# Patient Record
Sex: Female | Born: 1950 | Race: White | Hispanic: No | Marital: Married | Smoking: Never smoker
Health system: Southern US, Community
[De-identification: ages and names within clinical notes are randomized; demographics above are authoritative.]

## PROBLEM LIST (undated history)

## (undated) DIAGNOSIS — T8859XA Other complications of anesthesia, initial encounter: Secondary | ICD-10-CM

## (undated) DIAGNOSIS — M79606 Pain in leg, unspecified: Secondary | ICD-10-CM

## (undated) DIAGNOSIS — Z9989 Dependence on other enabling machines and devices: Secondary | ICD-10-CM

## (undated) DIAGNOSIS — M5416 Radiculopathy, lumbar region: Secondary | ICD-10-CM

## (undated) DIAGNOSIS — F419 Anxiety disorder, unspecified: Secondary | ICD-10-CM

## (undated) DIAGNOSIS — M8580 Other specified disorders of bone density and structure, unspecified site: Secondary | ICD-10-CM

## (undated) DIAGNOSIS — M549 Dorsalgia, unspecified: Secondary | ICD-10-CM

## (undated) DIAGNOSIS — M5431 Sciatica, right side: Secondary | ICD-10-CM

## (undated) DIAGNOSIS — F32A Depression, unspecified: Secondary | ICD-10-CM

## (undated) DIAGNOSIS — M25559 Pain in unspecified hip: Secondary | ICD-10-CM

## (undated) DIAGNOSIS — R112 Nausea with vomiting, unspecified: Secondary | ICD-10-CM

## (undated) DIAGNOSIS — T4145XA Adverse effect of unspecified anesthetic, initial encounter: Secondary | ICD-10-CM

## (undated) DIAGNOSIS — M419 Scoliosis, unspecified: Secondary | ICD-10-CM

## (undated) DIAGNOSIS — E7800 Pure hypercholesterolemia, unspecified: Secondary | ICD-10-CM

## (undated) DIAGNOSIS — M5126 Other intervertebral disc displacement, lumbar region: Secondary | ICD-10-CM

## (undated) DIAGNOSIS — M1990 Unspecified osteoarthritis, unspecified site: Secondary | ICD-10-CM

## (undated) DIAGNOSIS — Z9889 Other specified postprocedural states: Secondary | ICD-10-CM

## (undated) DIAGNOSIS — G2581 Restless legs syndrome: Secondary | ICD-10-CM

## (undated) DIAGNOSIS — M4310 Spondylolisthesis, site unspecified: Secondary | ICD-10-CM

## (undated) DIAGNOSIS — B958 Unspecified staphylococcus as the cause of diseases classified elsewhere: Secondary | ICD-10-CM

## (undated) DIAGNOSIS — B009 Herpesviral infection, unspecified: Secondary | ICD-10-CM

## (undated) DIAGNOSIS — K219 Gastro-esophageal reflux disease without esophagitis: Secondary | ICD-10-CM

## (undated) DIAGNOSIS — M48061 Spinal stenosis, lumbar region without neurogenic claudication: Secondary | ICD-10-CM

## (undated) HISTORY — DX: Spondylolisthesis, site unspecified: M43.10

## (undated) HISTORY — DX: Other specified disorders of bone density and structure, unspecified site: M85.80

## (undated) HISTORY — DX: Gastro-esophageal reflux disease without esophagitis: K21.9

## (undated) HISTORY — DX: Restless legs syndrome: G25.81

## (undated) HISTORY — DX: Spinal stenosis, lumbar region without neurogenic claudication: M48.061

## (undated) HISTORY — DX: Pain in leg, unspecified: M79.606

## (undated) HISTORY — PX: COLONOSCOPY: SHX5424

## (undated) HISTORY — DX: Pain in unspecified hip: M25.559

## (undated) HISTORY — DX: Scoliosis, unspecified: M41.9

## (undated) HISTORY — PX: FINGER SURGERY: SHX640

## (undated) HISTORY — PX: DIAGNOSTIC LAPAROSCOPY: SUR761

## (undated) HISTORY — DX: Pure hypercholesterolemia, unspecified: E78.00

## (undated) HISTORY — DX: Other intervertebral disc displacement, lumbar region: M51.26

## (undated) HISTORY — DX: Unspecified staphylococcus as the cause of diseases classified elsewhere: B95.8

## (undated) HISTORY — DX: Sciatica, right side: M54.31

## (undated) HISTORY — DX: Dorsalgia, unspecified: M54.9

## (undated) HISTORY — DX: Radiculopathy, lumbar region: M54.16

## (undated) HISTORY — DX: Unspecified osteoarthritis, unspecified site: M19.90

---

## 2010-01-07 DIAGNOSIS — B958 Unspecified staphylococcus as the cause of diseases classified elsewhere: Secondary | ICD-10-CM

## 2010-01-07 HISTORY — DX: Unspecified staphylococcus as the cause of diseases classified elsewhere: B95.8

## 2017-02-07 DIAGNOSIS — M549 Dorsalgia, unspecified: Secondary | ICD-10-CM

## 2017-02-07 DIAGNOSIS — M25559 Pain in unspecified hip: Secondary | ICD-10-CM

## 2017-02-07 HISTORY — DX: Pain in unspecified hip: M25.559

## 2017-02-07 HISTORY — DX: Dorsalgia, unspecified: M54.9

## 2017-08-14 ENCOUNTER — Other Ambulatory Visit: Payer: Self-pay | Admitting: Neurosurgery

## 2017-08-14 DIAGNOSIS — M8580 Other specified disorders of bone density and structure, unspecified site: Secondary | ICD-10-CM

## 2017-10-01 ENCOUNTER — Ambulatory Visit
Admission: RE | Admit: 2017-10-01 | Discharge: 2017-10-01 | Disposition: A | Discharge status: Home / Self Care | Payer: Medicare Other | Source: Ambulatory Visit | Attending: Neurosurgery | Admitting: Neurosurgery

## 2017-10-01 DIAGNOSIS — M8580 Other specified disorders of bone density and structure, unspecified site: Secondary | ICD-10-CM

## 2017-10-01 HISTORY — DX: Other specified disorders of bone density and structure, unspecified site: M85.80

## 2017-11-06 ENCOUNTER — Telehealth: Payer: Self-pay | Admitting: Surgery

## 2017-11-06 ENCOUNTER — Other Ambulatory Visit: Payer: Self-pay | Admitting: *Deleted

## 2017-11-06 NOTE — Telephone Encounter (Signed)
sch appt spk to husband mld ltr 12/29/17 4pm f/u MD

## 2017-11-06 NOTE — Telephone Encounter (Signed)
-----   Message from Retta Mac, RN sent at 11/06/2017  1:43 PM EDT ----- Regarding: OFFICE APPOINTMENT Please schedule patient for office appointment with Dr. Myra Gianotti. ALIF surgery L5-S1. With Dr. Venetia Maxon is scheduled for 01/15/2018. Remind patient to bring any and all films related to this back surgery. Due to full schedules ... We can not reschedule if they miss this app Thanks, Kriste Basque

## 2017-11-07 ENCOUNTER — Encounter: Payer: Self-pay | Admitting: Surgery

## 2017-11-10 ENCOUNTER — Other Ambulatory Visit: Payer: Self-pay | Admitting: Neurosurgery

## 2017-11-12 ENCOUNTER — Other Ambulatory Visit: Payer: Self-pay | Admitting: *Deleted

## 2017-12-29 ENCOUNTER — Ambulatory Visit (INDEPENDENT_AMBULATORY_CARE_PROVIDER_SITE_OTHER): Payer: Medicare Other | Admitting: Surgery

## 2017-12-29 ENCOUNTER — Encounter: Payer: Self-pay | Admitting: Surgery

## 2017-12-29 VITALS — BP 129/70 | HR 78 | Temp 97.7°F | Resp 12 | Ht 65.0 in | Wt 146.0 lb

## 2017-12-29 DIAGNOSIS — M479 Spondylosis, unspecified: Secondary | ICD-10-CM | POA: Diagnosis not present

## 2017-12-29 NOTE — H&P (View-Only) (Signed)
Vascular and Vein Specialist of   Patient name: Jade MaskKathy Price MRN: [id number] DOB: 07/19/1950 Sex: female   REQUESTING PROVIDER:    Dr Venetia MaxonStern   REASON FOR CONSULT:    ALIF L5-S1  HISTORY OF PRESENT ILLNESS:   Jade Price is a 67 y.o. female, who is referred today for evaluation of L5-S1 anterior approach for degenerative back disease and scoliosis.  The patient has only had a diagnostic laparoscopic surgery performed in the remote past.  She is a non-smoker.  She suffers from hypercholesterolemia but does not take a statin.  She is on medication for reflux disease.  She has a history of postoperative nausea and vomiting.    PAST MEDICAL HISTORY    Past Medical History:  Diagnosis Date  . Arthritis   . Back pain 02/2017  . Degenerative lumbar spinal stenosis   . Disc displacement, lumbar   . GERD (gastroesophageal reflux disease)   . High cholesterol   . Hip pain 02/2017  . Leg pain    While walking  . Lumbar radiculopathy   . Osteopenia 10/01/2017  . RLS (restless legs syndrome)   . Sciatic notch pain, right   . Scoliosis   . Spondylolisthesis    Lumber region  . Staph infection 2012   Finger     FAMILY HISTORY   Family History  Problem Relation Age of Onset  . Cancer Mother   . Alzheimer's disease Mother     SOCIAL HISTORY:   Social History   Socioeconomic History  . Marital status: Married    Spouse name: Not on file  . Number of children: Not on file  . Years of education: Not on file  . Highest education level: Not on file  Occupational History  . Not on file  Social Needs  . Financial resource strain: Not on file  . Food insecurity:    Worry: Not on file    Inability: Not on file  . Transportation needs:    Medical: Not on file    Non-medical: Not on file  Tobacco Use  . Smoking status: Never Smoker  . Smokeless tobacco: Never Used  Substance and Sexual Activity  . Alcohol use: Not Currently    Comment: occasion  . Drug use: Never  . Sexual activity: Not on file  Lifestyle  . Physical activity:    Days per week: Not on file    Minutes per session: Not on file  . Stress: Not on file  Relationships  . Social connections:    Talks on phone: Not on file    Gets together: Not on file    Attends religious service: Not on file    Active member of club or organization: Not on file    Attends meetings of clubs or organizations: Not on file    Relationship status: Not on file  . Intimate partner violence:    Fear of current or ex partner: Not on file    Emotionally abused: Not on file    Physically abused: Not on file    Forced sexual activity: Not on file  Other Topics Concern  . Not on file  Social History Narrative  . Not on file    ALLERGIES:    No Known Allergies  CURRENT MEDICATIONS:    Current Outpatient Medications  Medication Sig Dispense Refill  . bismuth subsalicylate (PEPTO BISMOL) 262 MG chewable tablet Chew 524 mg by mouth as needed for indigestion or diarrhea or loose stools.    .Marland Kitchen  HYDROcodone-acetaminophen (NORCO/VICODIN) 5-325 MG tablet Take 1 tablet by mouth every 6 (six) hours as needed for moderate pain.    . naproxen sodium (ALEVE) 220 MG tablet Take 660 mg by mouth 3 (three) times daily as needed (for pain or headache).     Marland Kitchen. omeprazole (PRILOSEC OTC) 20 MG tablet Take 40 mg by mouth daily.    Marland Kitchen. rOPINIRole (REQUIP) 4 MG tablet Take 4 mg by mouth at bedtime.      No current facility-administered medications for this visit.     REVIEW OF SYSTEMS:   [X]  denotes positive finding, [ ]  denotes negative finding Cardiac  Comments:  Chest pain or chest pressure:    Shortness of breath upon exertion:    Short of breath when lying flat:    Irregular heart rhythm:        Vascular    Pain in calf, thigh, or hip brought on by ambulation:    Pain in feet at night that wakes you up from your sleep:     Blood clot in your veins:    Leg swelling:          Pulmonary    Oxygen at home:    Productive cough:     Wheezing:         Neurologic    Sudden weakness in arms or legs:     Sudden numbness in arms or legs:     Sudden onset of difficulty speaking or slurred speech:    Temporary loss of vision in one eye:     Problems with dizziness:         Gastrointestinal    Blood in stool:      Vomited blood:         Genitourinary    Burning when urinating:     Blood in urine:        Psychiatric    Major depression:         Hematologic    Bleeding problems:    Problems with blood clotting too easily:        Skin    Rashes or ulcers:        Constitutional    Fever or chills:     PHYSICAL EXAM:   Vitals:   12/29/17 1530  BP: 129/70  Pulse: 78  Resp: 12  Temp: 97.7 F (36.5 C)  SpO2: 96%  Weight: 146 lb (66.2 kg)  Height: 5\' 5"  (1.651 m)    GENERAL: The patient is a well-nourished female, in no acute distress. The vital signs are documented above. CARDIAC: There is a regular rate and rhythm.  VASCULAR: Palpable pedal pulses bilaterally PULMONARY: Nonlabored respirations ABDOMEN: Soft and non-tender with normal pitched bowel sounds.  MUSCULOSKELETAL: There are no major deformities or cyanosis. NEUROLOGIC: No focal weakness or paresthesias are detected. SKIN: There are no ulcers or rashes noted. PSYCHIATRIC: The patient has a normal affect.  STUDIES:   I have reviewed her MRIs from outside sources  ASSESSMENT and PLAN   Degenerative back disease: We discussed proceeding with anterior exposure for L5-S1 instrumentation by Dr. Venetia MaxonStern.  I discussed the risks and benefits of the operation including the risk of injury to the iliac artery and vein as well as the ureter.  All of her questions were answered.  Her procedure is been scheduled for January 9.   Durene CalWells Brabham, MD [facility] Tel [phone] Pager [phone]) [phone]

## 2017-12-29 NOTE — Progress Notes (Signed)
Vascular and Vein Specialist of   Patient name: Jade Price MRN: [id number] DOB: 07/19/1950 Sex: female   REQUESTING PROVIDER:    Dr Venetia MaxonStern   REASON FOR CONSULT:    ALIF L5-S1  HISTORY OF PRESENT ILLNESS:   Jade Price is a 67 y.o. female, who is referred today for evaluation of L5-S1 anterior approach for degenerative back disease and scoliosis.  The patient has only had a diagnostic laparoscopic surgery performed in the remote past.  She is a non-smoker.  She suffers from hypercholesterolemia but does not take a statin.  She is on medication for reflux disease.  She has a history of postoperative nausea and vomiting.    PAST MEDICAL HISTORY    Past Medical History:  Diagnosis Date  . Arthritis   . Back pain 02/2017  . Degenerative lumbar spinal stenosis   . Disc displacement, lumbar   . GERD (gastroesophageal reflux disease)   . High cholesterol   . Hip pain 02/2017  . Leg pain    While walking  . Lumbar radiculopathy   . Osteopenia 10/01/2017  . RLS (restless legs syndrome)   . Sciatic notch pain, right   . Scoliosis   . Spondylolisthesis    Lumber region  . Staph infection 2012   Finger     FAMILY HISTORY   Family History  Problem Relation Age of Onset  . Cancer Mother   . Alzheimer's disease Mother     SOCIAL HISTORY:   Social History   Socioeconomic History  . Marital status: Married    Spouse name: Not on file  . Number of children: Not on file  . Years of education: Not on file  . Highest education level: Not on file  Occupational History  . Not on file  Social Needs  . Financial resource strain: Not on file  . Food insecurity:    Worry: Not on file    Inability: Not on file  . Transportation needs:    Medical: Not on file    Non-medical: Not on file  Tobacco Use  . Smoking status: Never Smoker  . Smokeless tobacco: Never Used  Substance and Sexual Activity  . Alcohol use: Not Currently    Comment: occasion  . Drug use: Never  . Sexual activity: Not on file  Lifestyle  . Physical activity:    Days per week: Not on file    Minutes per session: Not on file  . Stress: Not on file  Relationships  . Social connections:    Talks on phone: Not on file    Gets together: Not on file    Attends religious service: Not on file    Active member of club or organization: Not on file    Attends meetings of clubs or organizations: Not on file    Relationship status: Not on file  . Intimate partner violence:    Fear of current or ex partner: Not on file    Emotionally abused: Not on file    Physically abused: Not on file    Forced sexual activity: Not on file  Other Topics Concern  . Not on file  Social History Narrative  . Not on file    ALLERGIES:    No Known Allergies  CURRENT MEDICATIONS:    Current Outpatient Medications  Medication Sig Dispense Refill  . bismuth subsalicylate (PEPTO BISMOL) 262 MG chewable tablet Chew 524 mg by mouth as needed for indigestion or diarrhea or loose stools.    .Marland Kitchen  HYDROcodone-acetaminophen (NORCO/VICODIN) 5-325 MG tablet Take 1 tablet by mouth every 6 (six) hours as needed for moderate pain.    . naproxen sodium (ALEVE) 220 MG tablet Take 660 mg by mouth 3 (three) times daily as needed (for pain or headache).     . omeprazole (PRILOSEC OTC) 20 MG tablet Take 40 mg by mouth daily.    . rOPINIRole (REQUIP) 4 MG tablet Take 4 mg by mouth at bedtime.      No current facility-administered medications for this visit.     REVIEW OF SYSTEMS:   [X] denotes positive finding, [ ] denotes negative finding Cardiac  Comments:  Chest pain or chest pressure:    Shortness of breath upon exertion:    Short of breath when lying flat:    Irregular heart rhythm:        Vascular    Pain in calf, thigh, or hip brought on by ambulation:    Pain in feet at night that wakes you up from your sleep:     Blood clot in your veins:    Leg swelling:          Pulmonary    Oxygen at home:    Productive cough:     Wheezing:         Neurologic    Sudden weakness in arms or legs:     Sudden numbness in arms or legs:     Sudden onset of difficulty speaking or slurred speech:    Temporary loss of vision in one eye:     Problems with dizziness:         Gastrointestinal    Blood in stool:      Vomited blood:         Genitourinary    Burning when urinating:     Blood in urine:        Psychiatric    Major depression:         Hematologic    Bleeding problems:    Problems with blood clotting too easily:        Skin    Rashes or ulcers:        Constitutional    Fever or chills:     PHYSICAL EXAM:   Vitals:   12/29/17 1530  BP: 129/70  Pulse: 78  Resp: 12  Temp: 97.7 F (36.5 C)  SpO2: 96%  Weight: 146 lb (66.2 kg)  Height: 5' 5" (1.651 m)    GENERAL: The patient is a well-nourished female, in no acute distress. The vital signs are documented above. CARDIAC: There is a regular rate and rhythm.  VASCULAR: Palpable pedal pulses bilaterally PULMONARY: Nonlabored respirations ABDOMEN: Soft and non-tender with normal pitched bowel sounds.  MUSCULOSKELETAL: There are no major deformities or cyanosis. NEUROLOGIC: No focal weakness or paresthesias are detected. SKIN: There are no ulcers or rashes noted. PSYCHIATRIC: The patient has a normal affect.  STUDIES:   I have reviewed her MRIs from outside sources  ASSESSMENT and PLAN   Degenerative back disease: We discussed proceeding with anterior exposure for L5-S1 instrumentation by Dr. Stern.  I discussed the risks and benefits of the operation including the risk of injury to the iliac artery and vein as well as the ureter.  All of her questions were answered.  Her procedure is been scheduled for January 9.   Wells Brabham, MD [facility] Ve[facility] Specialists of [place] Tel [phone] Pager [phone] 

## 2018-01-02 NOTE — Pre-Procedure Instructions (Signed)
Aurora MaskKathy Gaskins  01/02/2018       Your procedure is scheduled on Thursday, January 9.  [facility] at 5:30 AM               Your surgery or procedure is scheduled for 7:30 AM   Call this number if you have problems the morning of surgery: [phone]  This is the number for the Pre- Surgical Desk.                 For any other questions, please call [phone], Monday - Friday 8 AM - 4 PM.     Remember:  Do not eat or drink after midnight Wednesday, January 8.    Take these medicines the morning of surgery with A SIP OF WATER : omeprazole (PRILOSEC OTC)   May take HYDROcodone-acetaminophen (NORCO/VICODIN) if needed and you tolerate it on an empty stomach.   1 Week prior to surgery STOP taking Aspirin, Aspirin Products (Goody Powder, Excedrin Migraine), Ibuprofen (Advil), Naproxen (Aleve), Vitamins and Herbal Products (ie Fish Oil)  Special instructions:   [place]- Preparing For Surgery  Before surgery, you can play an important role. Because skin is not sterile, your skin needs to be as free of germs as possible. You can reduce the number of germs on your skin by washing with CHG (chlorahexidine gluconate) Soap before surgery.  CHG is an antiseptic cleaner which kills germs and bonds with the skin to continue killing germs even after washing.    Oral Hygiene is also important to reduce your risk of infection.  Remember - BRUSH YOUR TEETH THE MORNING OF SURGERY WITH YOUR REGULAR TOOTHPASTE  Please do not use if you have an allergy to CHG or antibacterial soaps. If your skin becomes reddened/irritated stop using the CHG.  Do not shave (including legs and underarms) for at least 48 hours prior to first CHG shower. It is OK to shave your face.  Please follow these instructions carefully.   1. Shower the NIGHT BEFORE SURGERY and the MORNING OF SURGERY with CHG.   2. If you chose to wash your hair, wash your hair first as usual with your normal  shampoo.  3. After you shampoo, rinse your hair and body thoroughly to remove the shampoo.  4. Use CHG as you would any other liquid soap. You can apply CHG directly to the skin and wash gently with a scrungie or a clean washcloth.   5. Apply the CHG Soap to your body ONLY FROM THE NECK DOWN.  Do not use on open wounds or open sores. Avoid contact with your eyes, ears, mouth and genitals (private parts). Wash Face and genitals (private parts)  with your normal soap.  6. Wash thoroughly, paying special attention to the area where your surgery will be performed.  7. Thoroughly rinse your body with warm water from the neck down.  8. DO NOT shower/wash with your normal soap after using and rinsing off the CHG Soap.  9. Pat yourself dry with a CLEAN TOWEL.  10. Wear CLEAN PAJAMAS to bed the night before surgery, wear comfortable clothes the morning of surgery  11. Place CLEAN SHEETS on your bed the night of your first shower and DO NOT SLEEP WITH PETS.  Day of Surgery: Shower as instructed above Do not apply any deodorants/lotions, powders or colognes.  Please wear clean clothes to the hospital/surgery center.   Remember to brush your teeth WITH YOUR REGULAR  TOOTHPASTE.             Do not wear jewelry, make-up or nail polish.  Do not shave 48 hours prior to surgery.    Do not bring valuables to the hospital.  [facility] is not [facility] for any belongings or valuables.  Contacts, dentures or bridgework may not be worn into surgery.  Leave your suitcase in the car.  After surgery it may be brought to your room.  For patients admitted to the hospital, discharge time will be determined by your treatment team.  Patients discharged the day of surgery will not be allowed to drive home.   Please read over the following fact sheets that you were given: Pain Booklet, Patient Instructions for Mupirocin Application, Coughing and Deep Breathing, Surgical Site Infections.

## 2018-01-05 ENCOUNTER — Encounter (HOSPITAL_COMMUNITY)
Admission: RE | Admit: 2018-01-05 | Discharge: 2018-01-05 | Disposition: A | Discharge status: Home / Self Care | Payer: Medicare Other | Source: Ambulatory Visit | Attending: Neurosurgery | Admitting: Neurosurgery

## 2018-01-05 ENCOUNTER — Other Ambulatory Visit: Payer: Self-pay

## 2018-01-05 ENCOUNTER — Encounter (HOSPITAL_COMMUNITY): Payer: Self-pay

## 2018-01-05 DIAGNOSIS — Z01812 Encounter for preprocedural laboratory examination: Secondary | ICD-10-CM | POA: Insufficient documentation

## 2018-01-05 HISTORY — DX: Other complications of anesthesia, initial encounter: T88.59XA

## 2018-01-05 HISTORY — DX: Other specified postprocedural states: R11.2

## 2018-01-05 HISTORY — DX: Anxiety disorder, unspecified: F41.9

## 2018-01-05 HISTORY — DX: Adverse effect of unspecified anesthetic, initial encounter: T41.45XA

## 2018-01-05 HISTORY — DX: Other specified postprocedural states: Z98.890

## 2018-01-05 LAB — CBC
HCT: 38.8 % (ref 36.0–46.0)
Hemoglobin: 12.4 g/dL (ref 12.0–15.0)
MCH: 29.4 pg (ref 26.0–34.0)
MCHC: 32 g/dL (ref 30.0–36.0)
MCV: 91.9 fL (ref 80.0–100.0)
Platelets: 268 10*3/uL (ref 150–400)
RBC: 4.22 MIL/uL (ref 3.87–5.11)
RDW: 13.5 % (ref 11.5–15.5)
WBC: 6.1 10*3/uL (ref 4.0–10.5)
nRBC: 0 % (ref 0.0–0.2)

## 2018-01-05 LAB — BASIC METABOLIC PANEL
Anion gap: 11 (ref 5–15)
BUN: 13 mg/dL (ref 8–23)
CO2: 21 mmol/L — ABNORMAL LOW (ref 22–32)
Calcium: 8.8 mg/dL — ABNORMAL LOW (ref 8.9–10.3)
Chloride: 108 mmol/L (ref 98–111)
Creatinine, Ser: 0.64 mg/dL (ref 0.44–1.00)
GFR calc Af Amer: >60 mL/min (ref >60)
GFR calc non Af Amer: >60 mL/min (ref >60)
Glucose, Bld: 139 mg/dL — ABNORMAL HIGH (ref 70–99)
Potassium: 4.1 mmol/L (ref 3.5–5.1)
Sodium: 140 mmol/L (ref 135–145)

## 2018-01-05 LAB — TYPE AND SCREEN
ABO/RH(D): O POS
Antibody Screen: NEGATIVE

## 2018-01-05 LAB — SURGICAL PCR SCREEN
MRSA, PCR: NEGATIVE
Staphylococcus aureus: NEGATIVE

## 2018-01-05 LAB — ABO/RH: ABO/RH(D): O POS

## 2018-01-05 NOTE — Progress Notes (Signed)
Jade Price reports that she cracked a tooth and has a dental appoinment 01/08/18, Patient questioned if it would interfere with surgery.  I spoke with Antionette PolesJames Burns, PA-C and he said it shouldn't, but to notify surgeon.  I called and left a message on [name]'s voice mail at Dr Fredrich BirksStern's office.

## 2018-01-05 NOTE — Progress Notes (Addendum)
PCP - Dr. Jiles Prowsay McGee,   Cardiologist - no  Chest x-ray -   EKG - NA  Stress Test - no  ECHO - no  Cardiac Cath - no  AICD-no PM- LOOP-no  Sleep Study - no CPAP - noCBC BMP T/S  LABS-CBC BMP T/S  ASA-  HA1C-NA Fasting Blood Sugar -  Checks Blood Sugar _____ times a day  Anesthesia-  Pt denies having chest pain, sob, or fever at this time. All instructions explained to the pt, with a verbal understanding of the material. Pt agrees to go over the instructions while at home for a better understanding. The opportunity to ask questions was provided.

## 2018-01-13 NOTE — H&P (Signed)
Patient ID:   [id number] Patient: Jade MaskKathy Price  Date of Birth: 09-10-1950 Visit Type: Office Visit   Date: 10/13/2017 12:45 PM Provider: Danae OrleansJoseph D. Venetia MaxonStern MD   This 68 year old female presents for back pain.  HISTORY OF PRESENT ILLNESS: 1.  back pain  10/01/2017 bone density revealed osteopenia  09/18/2017 right L3-4, right L4-5 TF ESI's by Dr. Venetia MaxonStern  Patient returns as scheduled noting "pain is returning".  She reports "great relief" x2 weeks following the injection.  Currently she notes right buttock pain.  She reports difficulty sleeping due to comfort.  Norco 5/325 taken q.i.d.  Despite frequent pain medication the patient is still in considerable discomfort and is very frustrated by her lack of improvement.  She is currently grading her back pain as 7/10 in severity.  We have discussed in detail surgical treatment options and given the severity of her spinal stenosis and scoliosis with foraminal stenosis and lateral listhesis with significant right leg pain greater than left leg pain, I have recommended proceeding with surgery.  This will consist of two-stage decompression and fusion with L5-S1 ALIF followed by right L1-2, L2-3, L3-4, L4-5 anterolateral decompression and fusion as stage I followed by T10 to pelvis fixation with intraoperative navigation as stage II.  I discussed the details of this surgery with the patient and her husband and they wished to proceed.      Medical/Surgical/Interim History Reviewed, no change.  Last detailed document date:08/04/2017.     PAST MEDICAL HISTORY, SURGICAL HISTORY, FAMILY HISTORY, SOCIAL HISTORY AND REVIEW OF SYSTEMS I have reviewed the patient's past medical, surgical, family and social history as well as the comprehensive review of systems as included on the [facility] history form dated 08/11/2017, which I have signed.  Family History: Reviewed, no changes.  Last detailed document date:08/04/2017.    Social History: Reviewed, no changes. Last detailed document date: 08/04/2017.    MEDICATIONS: (added, continued or stopped this visit) Started Medication Directions Instruction Stopped  Aleve 220 mg capsule    10/13/2017 Neurontin 300 mg capsule take 1 capsule by oral route 3 times every day   10/08/2017 Norco 5 mg-325 mg tablet take 1 tablet by oral route  every 6 hours as needed for pain   10/13/2017 Norco 5 mg-325 mg tablet take 1 tablet by oral route  every 6 hours as needed for pain    omeprazole 20 mg delayed release,disintegrating tablet     ropinirole 1 mg tablet take 1 tablet by oral route 2 times every day      ALLERGIES: Ingredient Reaction Medication Name Comment NO KNOWN ALLERGIES    No known allergies.    PHYSICAL EXAM:  Vitals Date Temp F BP Pulse Ht In Wt Lb BMI BSA Pain Score 10/13/2017  127/69 73 66 146 23.56  7/10     IMPRESSION:  Severe protracted scoliosis and lumbar radiculopathy under control with medications and without sustained relief from injections  PLAN: Proceed with decompression and fusion surgery.  Patient was fitted for a TLSO brace.  Risks and benefits were discussed with the patient in detail and nurse education was performed.  Orders: Diagnostic Procedures: Assessment Procedure M54.16 Scoliosis- AP/Lat Instruction(s)/Education: Assessment Instruction R03.0 Hypertension education Miscellaneous: Assessment  M41.20 TLSO Brace (Drawstring)  Completed Orders (this encounter) Order Details Reason Side Interpretation Result Initial Treatment Date Region Hypertension education Continue to monitor blood pressure, if blood pressure remains elevated contact primary doctor        Assessment/Plan  #  Detail Type Description  1. Assessment Low back pain, unspecified back pain laterality, with sciatica presence unspecified (M54.5).      2. Assessment Degenerative lumbar spinal stenosis (M48.061).     3. Assessment Lumbar radiculopathy (M54.16).     4. Assessment Spondylolisthesis, lumbar region (M43.16).     5. Assessment Scoliosis (and kyphoscoliosis), idiopathic (M41.20).  Plan Orders TLSO Brace (Drawstring).     6. Assessment Elevated blood-pressure reading, w/o diagnosis of htn (R03.0).       Pain Management Plan Pain Scale: 7/10. Method: Numeric Pain Intensity Scale. Location: back. Onset: 03/06/2017. Duration: varies. Quality: discomforting. Pain management follow-up plan of care: Patient taken medication as prescribed.  Fall Risk Plan The patient has not fallen in the last year.     MEDICATIONS PRESCRIBED TODAY    Rx Quantity Refills NORCO 5 mg-325 mg  120 0 NEURONTIN 300 mg  90 3           Provider:  Danae Orleans. Venetia Maxon MD  10/18/2017 04:14 PM Dictation edited by: Danae Orleans. Venetia Maxon    CC Providers: PCP  None   Jonathon Bellows  [address] [place], [place] [address]               Electronically signed by Danae Orleans Venetia Maxon MD on 10/18/2017 04:14 PM     Patient ID:   [id number] Patient: Jade Price  Date of Birth: 07/19/1950 Visit Type: Office Visit   Date: 08/04/2017 01:00 PM Provider: Danae Orleans. Venetia Maxon MD   This 68 year old female presents for back pain.  HISTORY OF PRESENT ILLNESS: 1.  back pain  Jade Price, 68 year old female and wife of retired family physician Dr. Casimiro Needle, visits for evaluation.  Patient notes longstanding history scoliosis with mild back pain.  Mid February, patient experienced severe lumbar pain preventing ambulation with shooting pains down the right leg.  She recalls no injury.    Steroid Dosepak offered no lasting relief Norco 5/325 b.i.d. Ran out  History:  RLS, GERD, scoliosis, cholesterol Surgical history:  Right 5th digit staph infection, laparoscopy years ago  Imaging on Canopy  I  reviewed the prior physicians recommendations for surgical treatment which consisted of L1 through L5 decompression and fixation at L4 through L5 level.  I reviewed the patient's radiographs which show levoconvex scoliosis of the lumbar spine there is also sagittal imbalance and the patient has retroverted pelvis.  She has a significant compensatory thoracic curvature and primary lumbar scoliosis curvature.  She has spondylolisthesis of L4 on L5 of 7 mm on neutral, 6 mm on flexion, 6 mm on extension and significant right-sided nerve root compression with lateral listhesis at the L3-4 and L4-5 levels affecting the right L3 and right L4 nerve roots.       PAST MEDICAL/SURGICAL HISTORY:   (Detailed)      PAST MEDICAL HISTORY, SURGICAL HISTORY, FAMILY HISTORY, SOCIAL HISTORY AND REVIEW OF SYSTEMS I have reviewed the patient's past medical, surgical, family and social history as well as the comprehensive review of systems as included on the [facility] history form dated 08/04/2017, which I have signed.  Family History:  (Detailed)   Social History:  (Detailed) Tobacco use reviewed. Preferred language is [place].   Tobacco use status: Current non-smoker. Smoking status: Never smoker.  SMOKING STATUS Type Smoking Status Usage Per Day Years Used Total Pack Years  Never smoker         MEDICATIONS: (added, continued or stopped this  visit) Started Medication Directions Instruction Stopped  Aleve 220 mg capsule    08/04/2017 Norco 5 mg-325 mg tablet take 1 tablet by oral route  every 6 hours as needed for pain    omeprazole 20 mg delayed release,disintegrating tablet     ropinirole 1 mg tablet take 1 tablet by oral route 2 times every day      ALLERGIES: Ingredient Reaction Medication Name Comment NO KNOWN ALLERGIES    No known allergies.   REVIEW OF SYSTEMS  See scanned patient registration form, dated  08/04/2017, signed and dated on 08/09/2017  Review of Systems Details System Neg/Pos Details Constitutional Negative Chills, Fatigue, Fever, Malaise, Night sweats, Weight gain and Weight loss. ENMT Negative Ear drainage, Hearing loss, Nasal drainage, Otalgia, Sinus pressure and Sore throat. Eyes Negative Eye discharge, Eye pain and Vision changes. Respiratory Negative Chronic cough, Cough, Dyspnea, Known TB exposure and Wheezing. Cardio Negative Chest pain, Claudication, Edema and Irregular heartbeat/palpitations. GI Negative Abdominal pain, Blood in stool, Change in stool pattern, Constipation, Decreased appetite, Diarrhea, Heartburn, Nausea and Vomiting. GU Negative Dysuria, Hematuria, Polyuria (Genitourinary), Urinary frequency, Urinary incontinence and Urinary retention. Endocrine Negative Cold intolerance, Heat intolerance, Polydipsia and Polyphagia. Neuro Positive Gait disturbance. Psych Negative Anxiety, Depression and Insomnia. Integumentary Negative Brittle hair, Brittle nails, Change in shape/size of mole(s), Hair loss, Hirsutism, Hives, Pruritus, Rash and Skin lesion. MS Positive Back pain, RLE pain. Hema/Lymph Negative Easy bleeding, Easy bruising and Lymphadenopathy. Allergic/Immuno Negative Contact allergy, Environmental allergies, Food allergies and Seasonal allergies. Reproductive Negative Breast discharge, Breast lumps, Dysmenorrhea, Dyspareunia, History of abnormal PAP smear, Hot flashes, Irregular menses and Vaginal discharge.  PHYSICAL EXAM:  Vitals Date Temp F BP Pulse Ht In Wt Lb BMI BSA Pain Score 08/04/2017  121/74 85 66 154 24.86  7/10   PHYSICAL EXAM Details General Level of Distress: no acute distress Overall Appearance: normal  Head and Face  Right Left  Fundoscopic Exam:  normal normal    Cardiovascular Cardiac: regular rate and rhythm without murmur  Right Left  Carotid  Pulses: normal normal  Respiratory Lungs: clear to auscultation  Neurological Orientation: normal Recent and Remote Memory: normal Attention Span and Concentration:   normal Language: normal Fund of Knowledge: normal  Right Left Sensation: normal normal Upper Extremity Coordination: normal normal  Lower Extremity Coordination: normal normal  Musculoskeletal Gait and Station: normal  Right Left Upper Extremity Muscle Strength: normal normal Lower Extremity Muscle Strength: normal normal Upper Extremity Muscle Tone:  normal normal Lower Extremity Muscle Tone: normal normal   Motor Strength Upper and lower extremity motor strength was tested in the clinically pertinent muscles.     Deep Tendon Reflexes  Right Left Biceps: normal normal Triceps: normal normal Brachioradialis: normal normal Patellar: normal normal Achilles: normal normal  Sensory Sensation was tested at L1 to S1.   Cranial Nerves II. Optic Nerve/Visual Fields: normal III. Oculomotor: normal IV. Trochlear: normal V. Trigeminal: normal VI. Abducens: normal VII. Facial: normal VIII. Acoustic/Vestibular: normal IX. Glossopharyngeal: normal X. Vagus: normal XI. Spinal Accessory: normal XII. Hypoglossal: normal  Motor and other Tests Lhermittes: negative Rhomberg: negative Pronator drift: absent     Right Left Hoffman's: normal normal Clonus: normal normal Babinski: normal normal SLR: negative negative Patrick's Pearlean Brownie): negative negative Toe Walk: normal normal Toe Lift: normal normal Heel Walk: normal normal SI Joint: nontender nontender   Additional Findings:  Patient is right sciatic notch discomfort to palpation.  She is able to bend touch her toes.  She is  able stand on her heels and toes.    IMPRESSION:  The patient is in considerable pain and has significant scoliotic curvature.  I recommended against the surgical recommendations made to her at her previous evaluation and  explained that if she was going to require surgery I did not think that a decompression over 5 levels with a fusion with hardware at 1 level would give her any long-term benefit.  I think it quite likely that she will require surgical intervention.  I would like to obtain scoliosis radiographs and bone density testing.  The patient will undergo a trial of injections and I introduced her Dr. Ollen BowlHarkins and we have gone ahead to set up right L3 and right L4 selective nerve root blocks to see if this will give her any relief of her discomfort.  The patient will follow-up with me after these injections to see how she is doing.  I refilled her prescription for hydrocodone today.  PLAN: Patient will follow-up with me with bone density testing and scoliosis radiographs after injections to see how she does in terms of managing her pain.  I will make further recommendations after that.  Orders: Office Procedures/Services: Assessment Service Comments M54.16 Lumbar Spine - NRB - right - L3 - L4   Diagnostic Procedures: Assessment Procedure M41.20 Scoliosis- AP/Lat M54.16 Lumbar Spine- AP/Lat/Flex/Ex  Completed Orders (this encounter) Order Details Reason Side Interpretation Result Initial Treatment Date Region Scoliosis- AP/Lat 1 of 2     08/04/2017 All Levels to All Levels Lumbar Spine- AP/Lat/Flex/Ex 2 of 2     08/04/2017 All Levels to All Levels  Assessment/Plan  # Detail Type Description  1. Assessment Disc displacement, lumbar (M51.26).     2. Assessment Degenerative lumbar spinal stenosis (M48.061).     3. Assessment Low back pain, unspecified back pain laterality, with sciatica presence unspecified (M54.5).     4. Assessment Spondylolisthesis, lumbar region (M43.16).     5. Assessment Scoliosis (and kyphoscoliosis), idiopathic (M41.20).     6. Assessment Lumbar radiculopathy (M54.16).  Plan Orders Lumbar Spine - NRB - right - L3 -  L4.       Pain Management Plan Location: back. Onset: 03/06/2017. Duration: varies. Quality: discomforting. Pain management follow-up plan of care: Patient taking medication as prescribed.Marland Kitchen.     MEDICATIONS PRESCRIBED TODAY    Rx Quantity Refills NORCO 5 mg-325 mg  60 0           Provider:  Danae OrleansJoseph D. Venetia MaxonStern MD  08/10/2017 12:34 PM Dictation edited by: Danae OrleansJoseph D. [facility]    CC Providers: Devin GoingAmanda  Mcgee [address] [place]  [phone]   Jonathon Bellowsachel McGee  [address] [place], [place] [phone]               Electronically signed by Danae OrleansJoseph D. Venetia MaxonStern MD on 08/10/2017 12:34 PM

## 2018-01-15 ENCOUNTER — Inpatient Hospital Stay (HOSPITAL_COMMUNITY): Payer: Medicare Other

## 2018-01-15 ENCOUNTER — Other Ambulatory Visit: Payer: Self-pay

## 2018-01-15 ENCOUNTER — Encounter (HOSPITAL_COMMUNITY)
Admission: RE | Disposition: A | Discharge status: Rehabilitation Facility | Payer: Self-pay | Source: Home / Self Care | Attending: Neurosurgery

## 2018-01-15 ENCOUNTER — Inpatient Hospital Stay (HOSPITAL_COMMUNITY)
Admission: RE | Admit: 2018-01-15 | Discharge: 2018-01-23 | DRG: 454 | Disposition: A | Discharge status: Rehabilitation Facility | Payer: Medicare Other | Attending: Neurosurgery | Admitting: Neurosurgery

## 2018-01-15 ENCOUNTER — Inpatient Hospital Stay (HOSPITAL_COMMUNITY): Payer: Medicare Other | Admitting: Certified Registered Nurse Anesthetist

## 2018-01-15 ENCOUNTER — Encounter (HOSPITAL_COMMUNITY): Payer: Self-pay

## 2018-01-15 DIAGNOSIS — Z981 Arthrodesis status: Secondary | ICD-10-CM | POA: Diagnosis not present

## 2018-01-15 DIAGNOSIS — M5416 Radiculopathy, lumbar region: Secondary | ICD-10-CM | POA: Diagnosis present

## 2018-01-15 DIAGNOSIS — G2581 Restless legs syndrome: Secondary | ICD-10-CM | POA: Diagnosis present

## 2018-01-15 DIAGNOSIS — M4804 Spinal stenosis, thoracic region: Secondary | ICD-10-CM | POA: Diagnosis present

## 2018-01-15 DIAGNOSIS — M1990 Unspecified osteoarthritis, unspecified site: Secondary | ICD-10-CM | POA: Diagnosis present

## 2018-01-15 DIAGNOSIS — K219 Gastro-esophageal reflux disease without esophagitis: Secondary | ICD-10-CM | POA: Diagnosis present

## 2018-01-15 DIAGNOSIS — E785 Hyperlipidemia, unspecified: Secondary | ICD-10-CM | POA: Diagnosis present

## 2018-01-15 DIAGNOSIS — Z0289 Encounter for other administrative examinations: Secondary | ICD-10-CM

## 2018-01-15 DIAGNOSIS — D62 Acute posthemorrhagic anemia: Secondary | ICD-10-CM | POA: Diagnosis not present

## 2018-01-15 DIAGNOSIS — Z79899 Other long term (current) drug therapy: Secondary | ICD-10-CM

## 2018-01-15 DIAGNOSIS — Z888 Allergy status to other drugs, medicaments and biological substances status: Secondary | ICD-10-CM | POA: Diagnosis not present

## 2018-01-15 DIAGNOSIS — K5900 Constipation, unspecified: Secondary | ICD-10-CM | POA: Diagnosis present

## 2018-01-15 DIAGNOSIS — M4316 Spondylolisthesis, lumbar region: Secondary | ICD-10-CM | POA: Diagnosis present

## 2018-01-15 DIAGNOSIS — Z820 Family history of epilepsy and other diseases of the nervous system: Secondary | ICD-10-CM | POA: Diagnosis not present

## 2018-01-15 DIAGNOSIS — M961 Postlaminectomy syndrome, not elsewhere classified: Secondary | ICD-10-CM | POA: Diagnosis not present

## 2018-01-15 DIAGNOSIS — M8580 Other specified disorders of bone density and structure, unspecified site: Secondary | ICD-10-CM | POA: Diagnosis present

## 2018-01-15 DIAGNOSIS — M48062 Spinal stenosis, lumbar region with neurogenic claudication: Secondary | ICD-10-CM | POA: Diagnosis not present

## 2018-01-15 DIAGNOSIS — G8929 Other chronic pain: Secondary | ICD-10-CM | POA: Diagnosis present

## 2018-01-15 DIAGNOSIS — M48061 Spinal stenosis, lumbar region without neurogenic claudication: Secondary | ICD-10-CM | POA: Diagnosis present

## 2018-01-15 DIAGNOSIS — M545 Low back pain: Secondary | ICD-10-CM | POA: Diagnosis present

## 2018-01-15 DIAGNOSIS — Z9889 Other specified postprocedural states: Secondary | ICD-10-CM

## 2018-01-15 DIAGNOSIS — K5901 Slow transit constipation: Secondary | ICD-10-CM | POA: Diagnosis not present

## 2018-01-15 DIAGNOSIS — M4125 Other idiopathic scoliosis, thoracolumbar region: Secondary | ICD-10-CM | POA: Diagnosis present

## 2018-01-15 DIAGNOSIS — M5415 Radiculopathy, thoracolumbar region: Secondary | ICD-10-CM | POA: Diagnosis not present

## 2018-01-15 DIAGNOSIS — Z419 Encounter for procedure for purposes other than remedying health state, unspecified: Secondary | ICD-10-CM

## 2018-01-15 DIAGNOSIS — E876 Hypokalemia: Secondary | ICD-10-CM | POA: Diagnosis not present

## 2018-01-15 DIAGNOSIS — M419 Scoliosis, unspecified: Secondary | ICD-10-CM

## 2018-01-15 DIAGNOSIS — Z4789 Encounter for other orthopedic aftercare: Secondary | ICD-10-CM | POA: Diagnosis present

## 2018-01-15 DIAGNOSIS — M5116 Intervertebral disc disorders with radiculopathy, lumbar region: Secondary | ICD-10-CM | POA: Diagnosis present

## 2018-01-15 HISTORY — PX: ANTERIOR LATERAL LUMBAR FUSION 4 LEVELS: SHX5552

## 2018-01-15 HISTORY — PX: ANTERIOR LUMBAR FUSION: SHX1170

## 2018-01-15 HISTORY — PX: ABDOMINAL EXPOSURE: SHX5708

## 2018-01-15 SURGERY — ANTERIOR LUMBAR FUSION 1 LEVEL
Anesthesia: General | Laterality: Right

## 2018-01-15 MED ORDER — FENTANYL CITRATE (PF) 100 MCG/2ML IJ SOLN
INTRAMUSCULAR | Status: DC | PRN
  Administered 2018-01-15: 100 ug via INTRAVENOUS
  Administered 2018-01-15: 50 ug via INTRAVENOUS
  Administered 2018-01-15 (×3): 100 ug via INTRAVENOUS
  Administered 2018-01-15 (×2): 50 ug via INTRAVENOUS
  Administered 2018-01-15: 150 ug via INTRAVENOUS
  Administered 2018-01-15: 50 ug via INTRAVENOUS

## 2018-01-15 MED ORDER — ALUM & MAG HYDROXIDE-SIMETH 200-200-20 MG/5ML PO SUSP: 30.0000 mL | Freq: Four times a day (QID) | ORAL | Status: DC | PRN

## 2018-01-15 MED ORDER — ACETAMINOPHEN 325 MG PO TABS
650.0000 mg | ORAL_TABLET | ORAL | Status: DC | PRN
  Administered 2018-01-16 – 2018-01-22 (×10): 650 mg via ORAL
  Filled 2018-01-15 (×9): qty 2

## 2018-01-15 MED ORDER — LACTATED RINGERS IV SOLN
INTRAVENOUS | Status: DC | PRN
  Administered 2018-01-15 (×2): via INTRAVENOUS

## 2018-01-15 MED ORDER — FENTANYL CITRATE (PF) 100 MCG/2ML IJ SOLN
INTRAMUSCULAR | Status: AC
  Administered 2018-01-15: 50 ug via INTRAVENOUS
  Filled 2018-01-15: qty 2

## 2018-01-15 MED ORDER — DEXAMETHASONE SODIUM PHOSPHATE 10 MG/ML IJ SOLN
INTRAMUSCULAR | Status: AC
  Filled 2018-01-15: qty 1

## 2018-01-15 MED ORDER — LIDOCAINE 2% (20 MG/ML) 5 ML SYRINGE
INTRAMUSCULAR | Status: AC
  Filled 2018-01-15: qty 5

## 2018-01-15 MED ORDER — HYDROCODONE-ACETAMINOPHEN 5-325 MG PO TABS
1.0000 | ORAL_TABLET | Freq: Four times a day (QID) | ORAL | Status: DC | PRN
  Administered 2018-01-15: 1 via ORAL
  Filled 2018-01-15: qty 1

## 2018-01-15 MED ORDER — ONDANSETRON HCL 4 MG PO TABS: 4.0000 mg | ORAL_TABLET | Freq: Four times a day (QID) | ORAL | Status: DC | PRN

## 2018-01-15 MED ORDER — ONDANSETRON HCL 4 MG/2ML IJ SOLN
4.0000 mg | Freq: Once | INTRAMUSCULAR | Status: AC | PRN
  Administered 2018-01-15: 4 mg via INTRAVENOUS

## 2018-01-15 MED ORDER — THROMBIN 5000 UNITS EX SOLR
OROMUCOSAL | Status: DC | PRN
  Administered 2018-01-15: 09:00:00 via TOPICAL

## 2018-01-15 MED ORDER — OMEPRAZOLE MAGNESIUM 20 MG PO TBEC: 40.0000 mg | DELAYED_RELEASE_TABLET | Freq: Every day | ORAL | Status: DC

## 2018-01-15 MED ORDER — MIDAZOLAM HCL 2 MG/2ML IJ SOLN
INTRAMUSCULAR | Status: DC | PRN
  Administered 2018-01-15: 2 mg via INTRAVENOUS

## 2018-01-15 MED ORDER — CHLORHEXIDINE GLUCONATE 4 % EX LIQD: 60.0000 mL | Freq: Once | CUTANEOUS | Status: DC

## 2018-01-15 MED ORDER — MORPHINE SULFATE (PF) 2 MG/ML IV SOLN: 2.0000 mg | INTRAVENOUS | Status: DC | PRN

## 2018-01-15 MED ORDER — CHLORHEXIDINE GLUCONATE CLOTH 2 % EX PADS: 6.0000 | MEDICATED_PAD | Freq: Once | CUTANEOUS | Status: DC

## 2018-01-15 MED ORDER — OXYCODONE HCL 5 MG PO TABS
5.0000 mg | ORAL_TABLET | ORAL | Status: DC | PRN
  Administered 2018-01-15 – 2018-01-17 (×7): 5 mg via ORAL
  Filled 2018-01-15 (×7): qty 1

## 2018-01-15 MED ORDER — SUGAMMADEX SODIUM 200 MG/2ML IV SOLN
INTRAVENOUS | Status: DC | PRN
  Administered 2018-01-15: 130 mg via INTRAVENOUS

## 2018-01-15 MED ORDER — BUPIVACAINE HCL (PF) 0.5 % IJ SOLN
INTRAMUSCULAR | Status: AC
  Filled 2018-01-15: qty 30

## 2018-01-15 MED ORDER — CEFAZOLIN SODIUM-DEXTROSE 2-4 GM/100ML-% IV SOLN
2.0000 g | Freq: Three times a day (TID) | INTRAVENOUS | Status: DC
  Filled 2018-01-15 (×2): qty 100

## 2018-01-15 MED ORDER — ROCURONIUM BROMIDE 10 MG/ML (PF) SYRINGE
PREFILLED_SYRINGE | INTRAVENOUS | Status: DC | PRN
  Administered 2018-01-15: 50 mg via INTRAVENOUS
  Administered 2018-01-15: 20 mg via INTRAVENOUS

## 2018-01-15 MED ORDER — POLYETHYLENE GLYCOL 3350 17 G PO PACK: 17.0000 g | PACK | Freq: Every day | ORAL | Status: DC | PRN

## 2018-01-15 MED ORDER — SCOPOLAMINE 1 MG/3DAYS TD PT72
MEDICATED_PATCH | TRANSDERMAL | Status: AC
  Filled 2018-01-15: qty 1

## 2018-01-15 MED ORDER — PROPOFOL 10 MG/ML IV BOLUS
INTRAVENOUS | Status: AC
  Filled 2018-01-15: qty 40

## 2018-01-15 MED ORDER — OXYCODONE HCL 5 MG PO TABS
5.0000 mg | ORAL_TABLET | Freq: Once | ORAL | Status: AC | PRN
  Administered 2018-01-15: 5 mg via ORAL

## 2018-01-15 MED ORDER — METHOCARBAMOL 1000 MG/10ML IJ SOLN
500.0000 mg | Freq: Four times a day (QID) | INTRAVENOUS | Status: DC | PRN
  Administered 2018-01-19: 500 mg via INTRAVENOUS
  Filled 2018-01-15: qty 5
  Filled 2018-01-15: qty 500
  Filled 2018-01-15: qty 5

## 2018-01-15 MED ORDER — DEXAMETHASONE SODIUM PHOSPHATE 10 MG/ML IJ SOLN
INTRAMUSCULAR | Status: DC | PRN
  Administered 2018-01-15: 10 mg via INTRAVENOUS

## 2018-01-15 MED ORDER — LIDOCAINE 2% (20 MG/ML) 5 ML SYRINGE
INTRAMUSCULAR | Status: DC | PRN
  Administered 2018-01-15: 60 mg via INTRAVENOUS

## 2018-01-15 MED ORDER — OXYCODONE HCL 5 MG PO TABS
ORAL_TABLET | ORAL | Status: AC
  Administered 2018-01-16: 5 mg via ORAL
  Filled 2018-01-15: qty 1

## 2018-01-15 MED ORDER — CEFAZOLIN SODIUM 1 G IJ SOLR
INTRAMUSCULAR | Status: AC
  Filled 2018-01-15: qty 20

## 2018-01-15 MED ORDER — DOCUSATE SODIUM 100 MG PO CAPS
100.0000 mg | ORAL_CAPSULE | Freq: Two times a day (BID) | ORAL | Status: DC
  Administered 2018-01-15 – 2018-01-23 (×13): 100 mg via ORAL
  Filled 2018-01-15 (×15): qty 1

## 2018-01-15 MED ORDER — FENTANYL CITRATE (PF) 250 MCG/5ML IJ SOLN
INTRAMUSCULAR | Status: AC
  Filled 2018-01-15: qty 5

## 2018-01-15 MED ORDER — ROPINIROLE HCL 1 MG PO TABS
4.0000 mg | ORAL_TABLET | Freq: Every day | ORAL | Status: DC
  Administered 2018-01-15 – 2018-01-22 (×8): 4 mg via ORAL
  Filled 2018-01-15 (×8): qty 4

## 2018-01-15 MED ORDER — ONDANSETRON HCL 4 MG/2ML IJ SOLN
INTRAMUSCULAR | Status: AC
  Filled 2018-01-15: qty 2

## 2018-01-15 MED ORDER — KCL IN DEXTROSE-NACL 20-5-0.45 MEQ/L-%-% IV SOLN
INTRAVENOUS | Status: DC
  Administered 2018-01-15 – 2018-01-16 (×2): via INTRAVENOUS
  Filled 2018-01-15 (×2): qty 1000

## 2018-01-15 MED ORDER — SODIUM CHLORIDE 0.9% FLUSH: 3.0000 mL | INTRAVENOUS | Status: DC | PRN

## 2018-01-15 MED ORDER — ONDANSETRON HCL 4 MG/2ML IJ SOLN
4.0000 mg | Freq: Four times a day (QID) | INTRAMUSCULAR | Status: DC | PRN
  Administered 2018-01-16 – 2018-01-22 (×3): 4 mg via INTRAVENOUS
  Filled 2018-01-15 (×4): qty 2

## 2018-01-15 MED ORDER — PHENYLEPHRINE 40 MCG/ML (10ML) SYRINGE FOR IV PUSH (FOR BLOOD PRESSURE SUPPORT)
PREFILLED_SYRINGE | INTRAVENOUS | Status: DC | PRN
  Administered 2018-01-15: 120 ug via INTRAVENOUS
  Administered 2018-01-15: 200 ug via INTRAVENOUS
  Administered 2018-01-15: 120 ug via INTRAVENOUS
  Administered 2018-01-15: 80 ug via INTRAVENOUS
  Administered 2018-01-15: 160 ug via INTRAVENOUS

## 2018-01-15 MED ORDER — MIDAZOLAM HCL 2 MG/2ML IJ SOLN
INTRAMUSCULAR | Status: AC
  Filled 2018-01-15: qty 2

## 2018-01-15 MED ORDER — PROPOFOL 10 MG/ML IV BOLUS
INTRAVENOUS | Status: DC | PRN
  Administered 2018-01-15: 150 mg via INTRAVENOUS

## 2018-01-15 MED ORDER — LACTATED RINGERS IV SOLN
INTRAVENOUS | Status: DC | PRN
  Administered 2018-01-15: 07:00:00 via INTRAVENOUS

## 2018-01-15 MED ORDER — OXYCODONE HCL 5 MG/5ML PO SOLN: 5.0000 mg | Freq: Once | ORAL | Status: AC | PRN

## 2018-01-15 MED ORDER — ACETAMINOPHEN 650 MG RE SUPP: 650.0000 mg | RECTAL | Status: DC | PRN

## 2018-01-15 MED ORDER — HYDROMORPHONE HCL 1 MG/ML IJ SOLN
0.5000 mg | INTRAMUSCULAR | Status: AC | PRN
  Administered 2018-01-15 (×4): 0.5 mg via INTRAVENOUS

## 2018-01-15 MED ORDER — BISACODYL 10 MG RE SUPP
10.0000 mg | Freq: Every day | RECTAL | Status: DC | PRN
  Administered 2018-01-16 – 2018-01-23 (×2): 10 mg via RECTAL
  Filled 2018-01-15 (×2): qty 1

## 2018-01-15 MED ORDER — ROCURONIUM BROMIDE 50 MG/5ML IV SOSY
PREFILLED_SYRINGE | INTRAVENOUS | Status: AC
  Filled 2018-01-15: qty 5

## 2018-01-15 MED ORDER — PROPOFOL 500 MG/50ML IV EMUL
INTRAVENOUS | Status: DC | PRN
  Administered 2018-01-15: 50 ug/kg/min via INTRAVENOUS

## 2018-01-15 MED ORDER — ARTIFICIAL TEARS OPHTHALMIC OINT
TOPICAL_OINTMENT | OPHTHALMIC | Status: AC
  Filled 2018-01-15: qty 3.5

## 2018-01-15 MED ORDER — SODIUM CHLORIDE 0.9 % IV SOLN: 250.0000 mL | INTRAVENOUS | Status: DC

## 2018-01-15 MED ORDER — CEFAZOLIN SODIUM-DEXTROSE 2-4 GM/100ML-% IV SOLN
2.0000 g | Freq: Three times a day (TID) | INTRAVENOUS | Status: AC
  Administered 2018-01-15 – 2018-01-16 (×2): 2 g via INTRAVENOUS
  Filled 2018-01-15 (×2): qty 100

## 2018-01-15 MED ORDER — HYDROMORPHONE HCL 1 MG/ML IJ SOLN
INTRAMUSCULAR | Status: AC
  Filled 2018-01-15: qty 2

## 2018-01-15 MED ORDER — SODIUM CHLORIDE 0.9 % IV SOLN
INTRAVENOUS | Status: DC | PRN
  Administered 2018-01-15: 30 ug/min via INTRAVENOUS

## 2018-01-15 MED ORDER — HYDROCODONE-ACETAMINOPHEN 5-325 MG PO TABS: 2.0000 | ORAL_TABLET | ORAL | Status: DC | PRN

## 2018-01-15 MED ORDER — BUPIVACAINE HCL (PF) 0.5 % IJ SOLN
INTRAMUSCULAR | Status: DC | PRN
  Administered 2018-01-15: 12 mL
  Administered 2018-01-15: 10 mL

## 2018-01-15 MED ORDER — LIDOCAINE-EPINEPHRINE 1 %-1:100000 IJ SOLN
INTRAMUSCULAR | Status: DC | PRN
  Administered 2018-01-15: 12 mL via INTRADERMAL
  Administered 2018-01-15: 10 mL via INTRADERMAL

## 2018-01-15 MED ORDER — NALOXONE HCL 0.4 MG/ML IJ SOLN
INTRAMUSCULAR | Status: AC
  Filled 2018-01-15: qty 1

## 2018-01-15 MED ORDER — FLEET ENEMA 7-19 GM/118ML RE ENEM: 1.0000 | ENEMA | Freq: Once | RECTAL | Status: DC | PRN

## 2018-01-15 MED ORDER — MENTHOL 3 MG MT LOZG
1.0000 | LOZENGE | OROMUCOSAL | Status: DC | PRN
  Filled 2018-01-15: qty 9

## 2018-01-15 MED ORDER — BISMUTH SUBSALICYLATE 262 MG PO CHEW
524.0000 mg | CHEWABLE_TABLET | ORAL | Status: DC | PRN
  Filled 2018-01-15: qty 2

## 2018-01-15 MED ORDER — ZOLPIDEM TARTRATE 5 MG PO TABS
5.0000 mg | ORAL_TABLET | Freq: Every evening | ORAL | Status: DC | PRN
  Administered 2018-01-16: 5 mg via ORAL
  Filled 2018-01-15: qty 1

## 2018-01-15 MED ORDER — CEFAZOLIN SODIUM-DEXTROSE 2-4 GM/100ML-% IV SOLN
2.0000 g | INTRAVENOUS | Status: AC
  Administered 2018-01-15 (×2): 2 g via INTRAVENOUS
  Filled 2018-01-15: qty 100

## 2018-01-15 MED ORDER — METHOCARBAMOL 500 MG PO TABS
500.0000 mg | ORAL_TABLET | Freq: Four times a day (QID) | ORAL | Status: DC | PRN
  Administered 2018-01-15 – 2018-01-23 (×17): 500 mg via ORAL
  Filled 2018-01-15 (×16): qty 1

## 2018-01-15 MED ORDER — SODIUM CHLORIDE 0.9% FLUSH
3.0000 mL | Freq: Two times a day (BID) | INTRAVENOUS | Status: DC
  Administered 2018-01-15 – 2018-01-19 (×7): 3 mL via INTRAVENOUS

## 2018-01-15 MED ORDER — METHOCARBAMOL 500 MG PO TABS
ORAL_TABLET | ORAL | Status: AC
  Administered 2018-01-16: 500 mg via ORAL
  Filled 2018-01-15: qty 1

## 2018-01-15 MED ORDER — PHENOL 1.4 % MT LIQD: 1.0000 | OROMUCOSAL | Status: DC | PRN

## 2018-01-15 MED ORDER — PANTOPRAZOLE SODIUM 40 MG IV SOLR
40.0000 mg | Freq: Every day | INTRAVENOUS | Status: DC
  Administered 2018-01-15 – 2018-01-19 (×5): 40 mg via INTRAVENOUS
  Filled 2018-01-15 (×5): qty 40

## 2018-01-15 MED ORDER — LIDOCAINE-EPINEPHRINE 1 %-1:100000 IJ SOLN
INTRAMUSCULAR | Status: AC
  Filled 2018-01-15: qty 1

## 2018-01-15 MED ORDER — 0.9 % SODIUM CHLORIDE (POUR BTL) OPTIME
TOPICAL | Status: DC | PRN
  Administered 2018-01-15: 1000 mL

## 2018-01-15 MED ORDER — THROMBIN 5000 UNITS EX SOLR
CUTANEOUS | Status: AC
  Filled 2018-01-15: qty 15000

## 2018-01-15 MED ORDER — HYDROMORPHONE HCL 1 MG/ML IJ SOLN
0.5000 mg | INTRAMUSCULAR | Status: DC | PRN
  Administered 2018-01-15 (×2): 0.5 mg via INTRAVENOUS
  Filled 2018-01-15 (×2): qty 1

## 2018-01-15 MED ORDER — FENTANYL CITRATE (PF) 100 MCG/2ML IJ SOLN
25.0000 ug | INTRAMUSCULAR | Status: DC | PRN
  Administered 2018-01-15 (×3): 50 ug via INTRAVENOUS

## 2018-01-15 SURGICAL SUPPLY — 112 items
APPLIER CLIP 11 MED OPEN (CLIP) ×4
BASE TI IMPLANT 8X38X28 15DEG (Neuro Prosthesis/Implant) ×2 IMPLANT
BASKET BONE COLLECTION (BASKET) IMPLANT
BLADE CLIPPER SURG (BLADE) ×2 IMPLANT
BOLT BASE TI 5X20 VARIABLE (Bolt) ×6 IMPLANT
BUR BARREL STRAIGHT FLUTE 4.0 (BURR) IMPLANT
CAGE COROENT XL 10X18X150 (Cage) ×2 IMPLANT
CANISTER SUCT 3000ML PPV (MISCELLANEOUS) ×4 IMPLANT
CARTRIDGE OIL MAESTRO DRILL (MISCELLANEOUS) ×2 IMPLANT
CLIP APPLIE 11 MED OPEN (CLIP) ×4 IMPLANT
CLIP VESOCCLUDE MED 24/CT (CLIP) ×2 IMPLANT
CLIP VESOCCLUDE SM WIDE 24/CT (CLIP) ×2 IMPLANT
COVER BACK TABLE 60X90IN (DRAPES) ×6 IMPLANT
COVER WAND RF STERILE (DRAPES) ×6 IMPLANT
DECANTER SPIKE VIAL GLASS SM (MISCELLANEOUS) ×8 IMPLANT
DERMABOND ADVANCED (GAUZE/BANDAGES/DRESSINGS) ×6
DERMABOND ADVANCED .7 DNX12 (GAUZE/BANDAGES/DRESSINGS) ×8 IMPLANT
DIFFUSER DRILL AIR PNEUMATIC (MISCELLANEOUS) ×2 IMPLANT
DRAPE C-ARM 42X72 X-RAY (DRAPES) ×8 IMPLANT
DRAPE C-ARMOR (DRAPES) ×6 IMPLANT
DRAPE INCISE IOBAN 66X45 STRL (DRAPES) ×4 IMPLANT
DRAPE LAPAROTOMY 100X72X124 (DRAPES) ×8 IMPLANT
DRSG OPSITE POSTOP 3X4 (GAUZE/BANDAGES/DRESSINGS) ×2 IMPLANT
DRSG OPSITE POSTOP 4X6 (GAUZE/BANDAGES/DRESSINGS) ×4 IMPLANT
DURAPREP 26ML APPLICATOR (WOUND CARE) ×8 IMPLANT
ELECT BLADE 4.0 EZ CLEAN MEGAD (MISCELLANEOUS) ×4
ELECT REM PT RETURN 9FT ADLT (ELECTROSURGICAL) ×8
ELECTRODE BLDE 4.0 EZ CLN MEGD (MISCELLANEOUS) IMPLANT
ELECTRODE REM PT RTRN 9FT ADLT (ELECTROSURGICAL) ×4 IMPLANT
GAUZE 4X4 16PLY RFD (DISPOSABLE) IMPLANT
GAUZE SPONGE 4X4 12PLY STRL (GAUZE/BANDAGES/DRESSINGS) IMPLANT
GLOVE BIO SURGEON STRL SZ8 (GLOVE) ×8 IMPLANT
GLOVE BIOGEL PI IND STRL 7.5 (GLOVE) ×2 IMPLANT
GLOVE BIOGEL PI IND STRL 8 (GLOVE) ×4 IMPLANT
GLOVE BIOGEL PI IND STRL 8.5 (GLOVE) ×4 IMPLANT
GLOVE BIOGEL PI INDICATOR 7.5 (GLOVE) ×2
GLOVE BIOGEL PI INDICATOR 8 (GLOVE) ×8
GLOVE BIOGEL PI INDICATOR 8.5 (GLOVE) ×4
GLOVE ECLIPSE 7.5 STRL STRAW (GLOVE) ×2 IMPLANT
GLOVE ECLIPSE 8.0 STRL XLNG CF (GLOVE) ×10 IMPLANT
GLOVE EXAM NITRILE XL STR (GLOVE) IMPLANT
GLOVE SURG SS PI 7.5 STRL IVOR (GLOVE) ×4 IMPLANT
GOWN STRL REUS W/ TWL LRG LVL3 (GOWN DISPOSABLE) ×4 IMPLANT
GOWN STRL REUS W/ TWL XL LVL3 (GOWN DISPOSABLE) ×8 IMPLANT
GOWN STRL REUS W/TWL 2XL LVL3 (GOWN DISPOSABLE) ×8 IMPLANT
GOWN STRL REUS W/TWL LRG LVL3 (GOWN DISPOSABLE) ×4
GOWN STRL REUS W/TWL XL LVL3 (GOWN DISPOSABLE) ×6
HEMOSTAT POWDER KIT SURGIFOAM (HEMOSTASIS) ×2 IMPLANT
INSERT FOGARTY 61MM (MISCELLANEOUS) IMPLANT
INSERT FOGARTY SM (MISCELLANEOUS) IMPLANT
KIT BASIN OR (CUSTOM PROCEDURE TRAY) ×6 IMPLANT
KIT DILATOR XLIF 5 (KITS) IMPLANT
KIT INFUSE SMALL (Orthopedic Implant) ×2 IMPLANT
KIT INFUSE XX SMALL 0.7CC (Orthopedic Implant) ×2 IMPLANT
KIT SURGICAL ACCESS MAXCESS 4 (KITS) ×2 IMPLANT
KIT TURNOVER KIT B (KITS) ×8 IMPLANT
KIT XLIF (KITS) ×2
LOOP VESSEL MAXI BLUE (MISCELLANEOUS) ×2 IMPLANT
LOOP VESSEL MINI RED (MISCELLANEOUS) ×2 IMPLANT
MARKER SKIN DUAL TIP RULER LAB (MISCELLANEOUS) ×2 IMPLANT
MODULE NVM5 NEXT GEN EMG (NEEDLE) ×2 IMPLANT
MODULUS XLW 10X22X50MM 10DEG (Spine Construct) ×2 IMPLANT
MODULUS XLW 10X22X55MM 10 (Spine Construct) ×2 IMPLANT
NDL HYPO 25X1 1.5 SAFETY (NEEDLE) ×2 IMPLANT
NDL SPNL 18GX3.5 QUINCKE PK (NEEDLE) ×2 IMPLANT
NEEDLE HYPO 25X1 1.5 SAFETY (NEEDLE) ×4 IMPLANT
NEEDLE SPNL 18GX3.5 QUINCKE PK (NEEDLE) ×4 IMPLANT
NS IRRIG 1000ML POUR BTL (IV SOLUTION) ×6 IMPLANT
OIL CARTRIDGE MAESTRO DRILL (MISCELLANEOUS)
PACK LAMINECTOMY NEURO (CUSTOM PROCEDURE TRAY) ×6 IMPLANT
PAD ARMBOARD 7.5X6 YLW CONV (MISCELLANEOUS) ×12 IMPLANT
PLATE SPINAL XL W/PLUS 8X22X50 (Plate) ×2 IMPLANT
PUTTY BONE ATTRAX 10CC STRIP (Putty) ×4 IMPLANT
SLEEVE SURGEON STRL (DRAPES) ×4 IMPLANT
SPONGE INTESTINAL PEANUT (DISPOSABLE) ×8 IMPLANT
SPONGE LAP 18X18 X RAY DECT (DISPOSABLE) ×4 IMPLANT
SPONGE LAP 4X18 RFD (DISPOSABLE) IMPLANT
SPONGE SURGIFOAM ABS GEL 100 (HEMOSTASIS) IMPLANT
SPONGE SURGIFOAM ABS GEL SZ50 (HEMOSTASIS) ×2 IMPLANT
STAPLER SKIN PROX WIDE 3.9 (STAPLE) ×4 IMPLANT
STAPLER VISISTAT 35W (STAPLE) IMPLANT
SUT MNCRL AB 4-0 PS2 18 (SUTURE) ×2 IMPLANT
SUT PDS AB 1 CTX 36 (SUTURE) ×6 IMPLANT
SUT PROLENE 4 0 RB 1 (SUTURE)
SUT PROLENE 4-0 RB1 .5 CRCL 36 (SUTURE) ×8 IMPLANT
SUT PROLENE 5 0 CC1 (SUTURE) IMPLANT
SUT PROLENE 6 0 C 1 30 (SUTURE) ×2 IMPLANT
SUT PROLENE 6 0 CC (SUTURE) IMPLANT
SUT SILK 0 TIES 10X30 (SUTURE) ×2 IMPLANT
SUT SILK 2 0 TIES 10X30 (SUTURE) ×2 IMPLANT
SUT SILK 2 0 TIES 17X18 (SUTURE)
SUT SILK 2 0SH CR/8 30 (SUTURE) IMPLANT
SUT SILK 2-0 18XBRD TIE BLK (SUTURE) ×2 IMPLANT
SUT SILK 3 0 TIES 10X30 (SUTURE) ×2 IMPLANT
SUT SILK 3 0SH CR/8 30 (SUTURE) IMPLANT
SUT VIC AB 0 CT1 27 (SUTURE)
SUT VIC AB 0 CT1 27XBRD ANBCTR (SUTURE) ×2 IMPLANT
SUT VIC AB 1 CT1 18XBRD ANBCTR (SUTURE) ×2 IMPLANT
SUT VIC AB 1 CT1 8-18 (SUTURE) ×4
SUT VIC AB 2-0 CT1 18 (SUTURE) ×4 IMPLANT
SUT VIC AB 2-0 CT1 27 (SUTURE)
SUT VIC AB 2-0 CT1 36 (SUTURE) ×2 IMPLANT
SUT VIC AB 2-0 CT1 TAPERPNT 27 (SUTURE) ×2 IMPLANT
SUT VIC AB 2-0 CTX 36 (SUTURE) IMPLANT
SUT VIC AB 3-0 SH 27 (SUTURE)
SUT VIC AB 3-0 SH 27X BRD (SUTURE) ×4 IMPLANT
SUT VIC AB 3-0 SH 8-18 (SUTURE) ×10 IMPLANT
SUT VICRYL 4-0 PS2 18IN ABS (SUTURE) IMPLANT
TOWEL GREEN STERILE (TOWEL DISPOSABLE) ×6 IMPLANT
TOWEL GREEN STERILE FF (TOWEL DISPOSABLE) ×4 IMPLANT
TRAY FOLEY MTR SLVR 16FR STAT (SET/KITS/TRAYS/PACK) ×6 IMPLANT
WATER STERILE IRR 1000ML POUR (IV SOLUTION) ×6 IMPLANT

## 2018-01-15 NOTE — Anesthesia Procedure Notes (Signed)
Arterial Line Insertion Start/End1/09/2018 7:10 AM, 01/15/2018 7:24 AM Performed by: Jodell Ciproato, Siearra Amberg A, CRNA, CRNA  Patient location: Pre-op. Preanesthetic checklist: patient identified, IV checked, site marked, risks and benefits discussed, surgical consent, monitors and equipment checked, pre-op evaluation, timeout performed and anesthesia consent Lidocaine 1% used for infiltration Right, radial was placed Catheter size: 20 G Hand hygiene performed  and maximum sterile barriers used  Allen's test indicative of satisfactory collateral circulation Attempts: 1 Procedure performed without using ultrasound guided technique. Following insertion, Biopatch and dressing applied. Post procedure assessment: normal  Patient tolerated the procedure well with no immediate complications.

## 2018-01-15 NOTE — Brief Op Note (Signed)
01/15/2018  1:06 PM  PATIENT:  Jade Price  68 y.o. female  PRE-OPERATIVE DIAGNOSIS:  Scoliosis (and kyphoscoliosis) Idiopathic thoracolumbar spine with foraminal stenosis, spondylolisthesis, radiculopathy, lumbago  POST-OPERATIVE DIAGNOSIS:  Scoliosis (and kyphoscoliosis) Idiopathic thoracolumbar spine with foraminal stenosis, spondylolisthesis, radiculopathy, lumbago  PROCEDURE:  Procedure(s) with comments: Lumbar Five to Sacral One  Anterior lumbar interbody fusion (N/A) - Lumbar Five to Sacral One  Anterior lumbar interbody fusion Right Anterior Lateral Lumbar Interbody Fusion Lumbar One-Two, Two-Three, Three-Four, and Four-Five (Right) - Right Anterior Lateral Lumbar Interbody Fusion Lumbar One-Two, Two-Three, Three-Four, and Four-Five  ABDOMINAL EXPOSURE (N/A)  SURGEON:  Surgeon(s) and Role: Panel 1:    Maeola Harman, MD - Primary    * Tressie Stalker, MD - Assisting Panel 2:    * Nada Libman, MD - Primary  PHYSICIAN ASSISTANT:   ASSISTANTS: Poteat, RN   ANESTHESIA:   general  EBL:  100 mL   BLOOD ADMINISTERED:none  DRAINS: none   LOCAL MEDICATIONS USED:  MARCAINE    and LIDOCAINE   SPECIMEN:  No Specimen  DISPOSITION OF SPECIMEN:  N/A  COUNTS:  YES  TOURNIQUET:  * No tourniquets in log *  DICTATION: DICTATION:   INDICATIONS:  Pateint is 68 year old female with chronic and intractable back and right lower extremity pain with scoliosis of the thoracolumbar spine with foraminal stenosis, spondylolisthesis, radiculopathy and lumbago.  She has immobilizing intractable pain.   It was elected to take her to surgery for anterior lumbar decompression and fusion at the  L 5 S1 level and then to perform right sided XLIF at L 12, L 23, L 34, L 45 levels.  PROCEDURE:  Doctor Myra Gianotti performed exposure and his portion of the procedure will be dictated separately.  Upon exposing the L 5 S1 level, a localizing X ray was obtained with the C arm.  I then incised the  anterior annulus and performed a thorough discectomy with wide ligamentous releases.  The endplates were cleared of disc and cartilagenous material and a thorough discectomy was performed with decompression of the ventral annulus and disc material.  After trials, a 15 degree, 8 x 38 x 28 Base Hyperlordotic ALIF cage was placed and lagged with 3, 5.0 x 20 mm screws, one in L 5 and 2 in S 1. This was a  titanium spacer which was selected, packed with extra extra small BMP and Attrax.  The implant was tamped into position and positioning was confirmed with C arm.   Locking mechanisms were engaged, soft tissues were inspected and found to be in good repair.  Retractors were removed. Fascia was closed with 1 PDS running stitch, skin edges closed with 2-0 and 3-0 vicryl sutures.  Wound was dressed with a sterile occlusive dressing.    Patient was then  placed in a left lateral decubitus position on the operative table and using orthogonally projected C-arm fluoroscopy the patient was placed so that the L 12, L 23, L 34, L 45  levels were visualized in AP and lateral plane. The patient was then taped into position.  Skin was marked along with a posterior finger dissection incision. Her flank was then prepped and draped in usual sterile fashion and incisions were made sequentially at L 45 and subsequently at L 34, L 23, L 12 level, working between the 11 th and 12 th ribs. Posterior finger dissection was made to enter the retroperitoneal space and then subsequently the probe was inserted into the psoas  muscle from the right side initially at the L 45 level. After mapping the neural elements were able to dock the probe per the midpoint of this vertebral level and without indications electrically of too close proximity to the neural tissues. Subsequently the self-retaining tractor was.after sequential dilators were utilized the shim was employed and the interspace was cleared of psoas muscle and then incised. A thorough  discectomy was performed. Instruments were used to clear the interspace of disc material. After thorough discectomy was performed and this was performed using AP and lateral fluoroscopy a 10 lordotic by 55 x 22 mm implant was packed with small BMP and Attrax. This was tamped into position and its position was confirmed on AP and lateral fluoroscopy.  from the right side initially at the L 34 level. After mapping the neural elements were able to dock the probe per the midpoint of this vertebral level and without indications electrically of too close proximity to the neural tissues. Subsequently the self-retaining tractor was.after sequential dilators were utilized the shim was employed and the interspace was cleared of psoas muscle and then incised. A thorough discectomy was performed. Instruments were used to clear the interspace of disc material. After thorough discectomy was performed and this was performed using AP and lateral fluoroscopy a 10 lordotic by 50 x 22 mm implant was packed with small BMP and Attrax. This was tamped into position and its position was confirmed on AP and lateral fluoroscopy. We then operated at the L 23 level through the same incision. After mapping the neural elements were able to dock the probe per the midpoint of this vertebral level and without indications electrically of too close proximity to the neural tissues. Subsequently the self-retaining tractor was.after sequential dilators were utilized the shim was employed and the interspace was cleared of psoas muscle and then incised. A thorough discectomy was performed. Instruments were used to clear the interspace of disc material. After thorough discectomy was performed and this was performed using AP and lateral fluoroscopy an 8 standard by 50 x 22 mm PEEK implant was packed with small BMP and Attrax. This was tamped into position and its position was confirmed on AP and lateral fluoroscopy. From the same incision, we were able to  access the L 12 level. After mapping the neural elements were able to dock the probe per the midpoint of this vertebral level and without indications electrically of too close proximity to the neural tissues. Subsequently the self-retaining tractor was.after sequential dilators were utilized the shim was employed and the interspace was cleared of psoas muscle and then incised. A thorough discectomy was performed. Instruments were used to clear the interspace of disc material. After thorough discectomy was performed and this was performed using AP and lateral fluoroscopy an 10 standard by 50 x 18 mm PEEK  implant was packed with small BMP and Attrax. This was tamped into position and its position was confirmed on AP and lateral fluoroscopy. Hemostasis was assured the wounds were irrigated interrupted Vicryl sutures.  Sterile occlusive dressing was placed with Dermabond. The patient was then extubated in the operating room and taken to recovery in stable and satisfactory condition having tolerated his operation well. Counts were correct at the end of the case.  Patient was extubated in the OR and taken to recovery having tolerated his surgery well.  Counts were correct.    Retractor Times: L 45: 15:35; L 34: 14:50; L 23: 8:50; L 12: 17:31.    PLAN OF  CARE: Admit to inpatient   PATIENT DISPOSITION:  PACU - hemodynamically stable.   Delay start of Pharmacological VTE agent (>24hrs) due to surgical blood loss or risk of bleeding: yes

## 2018-01-15 NOTE — OR Nursing (Signed)
The pulse ox was on the left great toe and was at 100% oxygen saturation pre insertion of the Thompson retractor and post removal of the Best boy.

## 2018-01-15 NOTE — Op Note (Signed)
    Patient name: Jade Price MRN: [id number] DOB: 20-Aug-1950 Sex: female  01/15/2018 Pre-operative Diagnosis: Degenerative back disease Post-operative diagnosis:  Same Surgeon:  Durene Cal Co-Surgeon: Dr. Venetia Maxon Procedure:   Anterior exposure, L5-S1 Anesthesia: General Blood Loss: 100 cc Specimens: None  Findings: Normal anatomy  Indications: Dr. Venetia Maxon recommended anterior instrumentation at the L5-S1 disc space.  I was asked to provide anterior exposure.  In our preoperative clinic visit I discussed the risks and benefits of the procedure with the patient.  Procedure:  The patient was identified in the holding area and taken to [facility] OR ROOM 21  The patient was then placed supine on the table. general anesthesia was administered.  The patient was prepped and draped in the usual sterile fashion.  A time out was called and antibiotics were administered.  Fluoroscopy was used to identify the appropriate location of the skin incision.  A transverse left lower quadrant incision was made beginning at the midline extending laterally.  Cautery was used to divide the subcutaneous tissue down to the abdominal wall fascia.  The fascia was then opened with cautery.  Subfascial flaps were then raised.  I then mobilized the medial and lateral border of the rectus muscle.  I began lateral to the rectus muscle and entered the retroperitoneal space.  The external iliac artery was bluntly mobilized.  Wiley retractors were used to help with exposure.  The ureter was identified.  It was in a more medial location than normal.  It was fully mobilized and protected laterally.  I then encountered the median sacral vessels.  The vein was rather large approximately 5 mm and so I ligated it between 2-0 silk ties.  I then mobilized the right lateral border of the spine bluntly with peanut retractors.  Once I had the L5-S1 disc space freed up, the Thompson retractor was positioned.  I placed 150 reverse lip blades on either side  of the spine.  140 malleable blades were then placed superiorly and inferiorly.  Fluoroscopy was used to confirm that we are at the appropriate location.  At this point, Dr. Venetia Maxon came in and performed his portion of the procedure.  Please see his detailed operative note for full description.  There were no immediate complications from my portion of the procedure.   Disposition: To PACU stable   V. Durene Cal, M.D. [facility] [place] [facility] Office: [phone] Pager:  [phone]

## 2018-01-15 NOTE — Progress Notes (Signed)
Awake, sleepy, but arousable and conversant.  MAEW with good strength.  Doing well after Stage I of scoliosis corrective surgery.

## 2018-01-15 NOTE — Progress Notes (Signed)
Sore in back.  Legs not painful.  Good strength, including hip flexors.  Doing well.

## 2018-01-15 NOTE — Social Work (Signed)
CSW acknowledging consult for SNF placement. Will follow for therapy recommendations.   Earla Charlie, MSW, LCSWA [facility] [phone]   

## 2018-01-15 NOTE — Progress Notes (Signed)
When asked pt her pain level, she says, "I don't know". Naps when left alone and appears much more comfortable. Dr Mal Amabile fully updated-OK to tx pt to 4NP.

## 2018-01-15 NOTE — Transfer of Care (Signed)
Immediate Anesthesia Transfer of Care Note  Patient: Jade Price  Procedure(s) Performed: Lumbar Five to Sacral One  Anterior lumbar interbody fusion (N/A ) Right Anterior Lateral Lumbar Interbody Fusion Lumbar One-Two, Two-Three, Three-Four, and Four-Five (Right ) ABDOMINAL EXPOSURE (N/A )  Patient Location: PACU  Anesthesia Type:General  Level of Consciousness: sedated  Airway & Oxygen Therapy: Patient Spontanous Breathing and Patient connected to nasal cannula oxygen  Post-op Assessment: Report given to RN, Post -op Vital signs reviewed and stable and Patient moving all extremities  Post vital signs: Reviewed and stable  Last Vitals:  Vitals Value Taken Time  BP 139/79 01/15/2018  1:13 PM  Temp 36.3 C 01/15/2018  1:11 PM  Pulse 90 01/15/2018  1:19 PM  Resp 36 01/15/2018  1:19 PM  SpO2 100 % 01/15/2018  1:19 PM  Vitals shown include unvalidated device data.  Last Pain:  Vitals:   01/15/18 1311  TempSrc:   PainSc: 0-No pain      Patients Stated Pain Goal: 2 (01/15/18 9147)  Complications: No apparent anesthesia complications

## 2018-01-15 NOTE — Interval H&P Note (Signed)
History and Physical Interval Note:  01/15/2018 7:23 AM  Jade Price  has presented today for surgery, with the diagnosis of Scoliosis (and kyphoscoliosis) Idiopathic  The various methods of treatment have been discussed with the patient and family. After consideration of risks, benefits and other options for treatment, the patient has consented to  Procedure(s) with comments: Lumbar 5 to Sacral 1 Anterior lumbar interbody fusion (N/A) - Lumbar 5 to Sacral 1 Anterior lumbar interbody fusion Right Lumbar 1-2 Lumbar 2-3 Lumbar 3-4 Lumbar 4-5 Anterior lateral lumbar interbody fusion (Right) - Right Lumbar 1-2 Lumbar 2-3 Lumbar 3-4 Lumbar 4-5 Anterior lateral lumbar interbody fusion ABDOMINAL EXPOSURE (N/A) as a surgical intervention .  The patient's history has been reviewed, patient examined, no change in status, stable for surgery.  I have reviewed the patient's chart and labs.  Questions were answered to the patient's satisfaction.     Dorian Heckle

## 2018-01-15 NOTE — Anesthesia Postprocedure Evaluation (Signed)
Anesthesia Post Note  Patient: Jade Price  Procedure(s) Performed: Lumbar Five to Sacral One  Anterior lumbar interbody fusion (N/A ) Right Anterior Lateral Lumbar Interbody Fusion Lumbar One-Two, Two-Three, Three-Four, and Four-Five (Right ) ABDOMINAL EXPOSURE (N/A )     Patient location during evaluation: PACU Anesthesia Type: General Level of consciousness: awake and alert Pain management: satisfactory to patient Vital Signs Assessment: post-procedure vital signs reviewed and stable Respiratory status: spontaneous breathing, nonlabored ventilation and respiratory function stable Cardiovascular status: blood pressure returned to baseline and stable Postop Assessment: no apparent nausea or vomiting Anesthetic complications: no    Last Vitals:  Vitals:   01/15/18 1445 01/15/18 1500  BP: (!) 145/82 (!) 146/71  Pulse:  99  Resp:  20  Temp:  (!) 36.2 C  SpO2:  100%                Beryle Lathe

## 2018-01-15 NOTE — Interval H&P Note (Signed)
History and Physical Interval Note:  01/15/2018 7:28 AM  Aurora Mask  has presented today for surgery, with the diagnosis of Scoliosis (and kyphoscoliosis) Idiopathic  The various methods of treatment have been discussed with the patient and family. After consideration of risks, benefits and other options for treatment, the patient has consented to  Procedure(s) with comments: Lumbar 5 to Sacral 1 Anterior lumbar interbody fusion (N/A) - Lumbar 5 to Sacral 1 Anterior lumbar interbody fusion Right Lumbar 1-2 Lumbar 2-3 Lumbar 3-4 Lumbar 4-5 Anterior lateral lumbar interbody fusion (Right) - Right Lumbar 1-2 Lumbar 2-3 Lumbar 3-4 Lumbar 4-5 Anterior lateral lumbar interbody fusion ABDOMINAL EXPOSURE (N/A) as a surgical intervention .  The patient's history has been reviewed, patient examined, no change in status, stable for surgery.  I have reviewed the patient's chart and labs.  Questions were answered to the patient's satisfaction.     Durene Cal

## 2018-01-15 NOTE — Anesthesia Procedure Notes (Signed)
Procedure Name: Intubation Date/Time: 01/15/2018 7:49 AM Performed by: Leonor Liv, CRNA Pre-anesthesia Checklist: Patient identified, Emergency Drugs available, Suction available and Patient being monitored Patient Re-evaluated:Patient Re-evaluated prior to induction Oxygen Delivery Method: Circle System Utilized Preoxygenation: Pre-oxygenation with 100% oxygen Induction Type: IV induction Ventilation: Mask ventilation without difficulty and Oral airway inserted - appropriate to patient size Laryngoscope Size: Mac and 3 Grade View: Grade I Tube type: Oral Tube size: 7.0 mm Number of attempts: 1 Airway Equipment and Method: Stylet and Oral airway Placement Confirmation: ETT inserted through vocal cords under direct vision,  positive ETCO2 and breath sounds checked- equal and bilateral Secured at: 21 cm Tube secured with: Tape (teeth) Dental Injury: Teeth and Oropharynx as per pre-operative assessment

## 2018-01-15 NOTE — Anesthesia Preprocedure Evaluation (Addendum)
Anesthesia Evaluation  Patient identified by MRN, date of birth, ID band Patient awake    Reviewed: Allergy & Precautions, NPO status , Patient's Chart, lab work & pertinent test results  History of Anesthesia Complications (+) PONV and history of anesthetic complications  Airway Mallampati: II  TM Distance: >3 FB Neck ROM: Full    Dental  (+) Dental Advisory Given, Chipped,    Pulmonary neg pulmonary ROS,    breath sounds clear to auscultation       Cardiovascular negative cardio ROS   Rhythm:Regular Rate:Normal     Neuro/Psych PSYCHIATRIC DISORDERS Anxiety  Neuromuscular disease (RLS)    GI/Hepatic Neg liver ROS, GERD  Medicated and Controlled,  Endo/Other  negative endocrine ROS  Renal/GU negative Renal ROS     Musculoskeletal  (+) Arthritis ,  Scoliosis Chronic back and leg pain    Abdominal   Peds  Hematology negative hematology ROS (+)   Anesthesia Other Findings   Reproductive/Obstetrics                            Anesthesia Physical Anesthesia Plan  ASA: II  Anesthesia Plan: General   Post-op Pain Management:    Induction: Intravenous  PONV Risk Score and Plan: 4 or greater and Treatment may vary due to age or medical condition, Ondansetron, Scopolamine patch - Pre-op, Dexamethasone and Midazolam  Airway Management Planned: Oral ETT  Additional Equipment: Arterial line  Intra-op Plan:   Post-operative Plan: Extubation in OR  Informed Consent: I have reviewed the patients History and Physical, chart, labs and discussed the procedure including the risks, benefits and alternatives for the proposed anesthesia with the patient or authorized representative who has indicated his/her understanding and acceptance.   Dental advisory given  Plan Discussed with: CRNA and Anesthesiologist  Anesthesia Plan Comments:       Anesthesia Quick Evaluation

## 2018-01-15 NOTE — Op Note (Signed)
01/15/2018  1:06 PM  PATIENT:  Jade Price  67 y.o. female  PRE-OPERATIVE DIAGNOSIS:  Scoliosis (and kyphoscoliosis) Idiopathic thoracolumbar spine with foraminal stenosis, spondylolisthesis, radiculopathy, lumbago  POST-OPERATIVE DIAGNOSIS:  Scoliosis (and kyphoscoliosis) Idiopathic thoracolumbar spine with foraminal stenosis, spondylolisthesis, radiculopathy, lumbago  PROCEDURE:  Procedure(s) with comments: Lumbar Five to Sacral One  Anterior lumbar interbody fusion (N/A) - Lumbar Five to Sacral One  Anterior lumbar interbody fusion Right Anterior Lateral Lumbar Interbody Fusion Lumbar One-Two, Two-Three, Three-Four, and Four-Five (Right) - Right Anterior Lateral Lumbar Interbody Fusion Lumbar One-Two, Two-Three, Three-Four, and Four-Five  ABDOMINAL EXPOSURE (N/A)  SURGEON:  Surgeon(s) and Role: Panel 1:    * Fionnuala Hemmerich, MD - Primary    * Jenkins, Jeffrey, MD - Assisting Panel 2:    * Brabham, Vance W, MD - Primary  PHYSICIAN ASSISTANT:   ASSISTANTS: Poteat, RN   ANESTHESIA:   general  EBL:  100 mL   BLOOD ADMINISTERED:none  DRAINS: none   LOCAL MEDICATIONS USED:  MARCAINE    and LIDOCAINE   SPECIMEN:  No Specimen  DISPOSITION OF SPECIMEN:  N/A  COUNTS:  YES  TOURNIQUET:  * No tourniquets in log *  DICTATION: DICTATION:   INDICATIONS:  Pateint is 67 year old female with chronic and intractable back and right lower extremity pain with scoliosis of the thoracolumbar spine with foraminal stenosis, spondylolisthesis, radiculopathy and lumbago.  She has immobilizing intractable pain.   It was elected to take her to surgery for anterior lumbar decompression and fusion at the  L 5 S1 level and then to perform right sided XLIF at L 12, L 23, L 34, L 45 levels.  PROCEDURE:  Doctor Brabham performed exposure and his portion of the procedure will be dictated separately.  Upon exposing the L 5 S1 level, a localizing X ray was obtained with the C arm.  I then incised the  anterior annulus and performed a thorough discectomy with wide ligamentous releases.  The endplates were cleared of disc and cartilagenous material and a thorough discectomy was performed with decompression of the ventral annulus and disc material.  After trials, a 15 degree, 8 x 38 x 28 Base Hyperlordotic ALIF cage was placed and lagged with 3, 5.0 x 20 mm screws, one in L 5 and 2 in S 1. This was a  titanium spacer which was selected, packed with extra extra small BMP and Attrax.  The implant was tamped into position and positioning was confirmed with C arm.   Locking mechanisms were engaged, soft tissues were inspected and found to be in good repair.  Retractors were removed. Fascia was closed with 1 PDS running stitch, skin edges closed with 2-0 and 3-0 vicryl sutures.  Wound was dressed with a sterile occlusive dressing.    Patient was then  placed in a left lateral decubitus position on the operative table and using orthogonally projected C-arm fluoroscopy the patient was placed so that the L 12, L 23, L 34, L 45  levels were visualized in AP and lateral plane. The patient was then taped into position.  Skin was marked along with a posterior finger dissection incision. Her flank was then prepped and draped in usual sterile fashion and incisions were made sequentially at L 45 and subsequently at L 34, L 23, L 12 level, working between the 11 th and 12 th ribs. Posterior finger dissection was made to enter the retroperitoneal space and then subsequently the probe was inserted into the psoas   muscle from the right side initially at the L 45 level. After mapping the neural elements were able to dock the probe per the midpoint of this vertebral level and without indications electrically of too close proximity to the neural tissues. Subsequently the self-retaining tractor was.after sequential dilators were utilized the shim was employed and the interspace was cleared of psoas muscle and then incised. A thorough  discectomy was performed. Instruments were used to clear the interspace of disc material. After thorough discectomy was performed and this was performed using AP and lateral fluoroscopy a 10 lordotic by 55 x 22 mm implant was packed with small BMP and Attrax. This was tamped into position and its position was confirmed on AP and lateral fluoroscopy.  from the right side initially at the L 34 level. After mapping the neural elements were able to dock the probe per the midpoint of this vertebral level and without indications electrically of too close proximity to the neural tissues. Subsequently the self-retaining tractor was.after sequential dilators were utilized the shim was employed and the interspace was cleared of psoas muscle and then incised. A thorough discectomy was performed. Instruments were used to clear the interspace of disc material. After thorough discectomy was performed and this was performed using AP and lateral fluoroscopy a 10 lordotic by 50 x 22 mm implant was packed with small BMP and Attrax. This was tamped into position and its position was confirmed on AP and lateral fluoroscopy. We then operated at the L 23 level through the same incision. After mapping the neural elements were able to dock the probe per the midpoint of this vertebral level and without indications electrically of too close proximity to the neural tissues. Subsequently the self-retaining tractor was.after sequential dilators were utilized the shim was employed and the interspace was cleared of psoas muscle and then incised. A thorough discectomy was performed. Instruments were used to clear the interspace of disc material. After thorough discectomy was performed and this was performed using AP and lateral fluoroscopy an 8 standard by 50 x 22 mm PEEK implant was packed with small BMP and Attrax. This was tamped into position and its position was confirmed on AP and lateral fluoroscopy. From the same incision, we were able to  access the L 12 level. After mapping the neural elements were able to dock the probe per the midpoint of this vertebral level and without indications electrically of too close proximity to the neural tissues. Subsequently the self-retaining tractor was.after sequential dilators were utilized the shim was employed and the interspace was cleared of psoas muscle and then incised. A thorough discectomy was performed. Instruments were used to clear the interspace of disc material. After thorough discectomy was performed and this was performed using AP and lateral fluoroscopy an 10 standard by 50 x 18 mm PEEK  implant was packed with small BMP and Attrax. This was tamped into position and its position was confirmed on AP and lateral fluoroscopy. Hemostasis was assured the wounds were irrigated interrupted Vicryl sutures.  Sterile occlusive dressing was placed with Dermabond. The patient was then extubated in the operating room and taken to recovery in stable and satisfactory condition having tolerated his operation well. Counts were correct at the end of the case.  Patient was extubated in the OR and taken to recovery having tolerated his surgery well.  Counts were correct.    Retractor Times: L 45: 15:35; L 34: 14:50; L 23: 8:50; L 12: 17:31.    PLAN OF  CARE: Admit to inpatient   PATIENT DISPOSITION:  PACU - hemodynamically stable.   Delay start of Pharmacological VTE agent (>24hrs) due to surgical blood loss or risk of bleeding: yes

## 2018-01-16 MED ORDER — HYDROMORPHONE HCL 2 MG PO TABS
2.0000 mg | ORAL_TABLET | ORAL | Status: DC | PRN
  Administered 2018-01-16: 2 mg via ORAL
  Administered 2018-01-16 (×2): 4 mg via ORAL
  Administered 2018-01-16: 2 mg via ORAL
  Administered 2018-01-17 – 2018-01-20 (×13): 4 mg via ORAL
  Filled 2018-01-16 (×3): qty 2
  Filled 2018-01-16: qty 1
  Filled 2018-01-16 (×8): qty 2
  Filled 2018-01-16: qty 1
  Filled 2018-01-16 (×4): qty 2

## 2018-01-16 MED ORDER — BISACODYL 10 MG RE SUPP
10.0000 mg | Freq: Once | RECTAL | Status: AC
  Filled 2018-01-16: qty 1

## 2018-01-16 NOTE — Progress Notes (Signed)
    Subjective  - POD #1  C/o abdominal and back pain and some pain down right leg   Physical Exam:  abd soft Abdominal incision clean and dry Palpable pedal pulse       Assessment/Plan:  POD #1  Stable from anterior exposure perspective.  Will be available as necessary.  Wells Brabham 01/16/2018 7:58 AM --  Vitals:   01/16/18 0000 01/16/18 0400  BP: (!) 97/53 92/67  Pulse: 67 97  Resp: 18 18  Temp: 98.7 F (37.1 C) 99.8 F (37.7 C)  SpO2:  97%    Intake/Output Summary (Last 24 hours) at 01/16/2018 0758 Last data filed at 01/15/2018 2200 Gross per 24 hour  Intake 2490 ml  Output 1695 ml  Net 795 ml     Laboratory CBC    Component Value Date/Time   WBC 6.1 01/05/2018 1400   HGB 12.4 01/05/2018 1400   HCT 38.8 01/05/2018 1400   PLT 268 01/05/2018 1400    BMET    Component Value Date/Time   NA 140 01/05/2018 1400   K 4.1 01/05/2018 1400   CL 108 01/05/2018 1400   CO2 21 (L) 01/05/2018 1400   GLUCOSE 139 (H) 01/05/2018 1400   BUN 13 01/05/2018 1400   CREATININE 0.64 01/05/2018 1400   CALCIUM 8.8 (L) 01/05/2018 1400   GFRNONAA >60 01/05/2018 1400   GFRAA >60 01/05/2018 1400    COAG No results found for: INR, PROTIME No results found for: PTT  Antibiotics Anti-infectives (From admission, onward)   Start     Dose/Rate Route Frequency Ordered Stop   01/15/18 2000  ceFAZolin (ANCEF) IVPB 2g/100 mL premix     2 g 200 mL/hr over 30 Minutes Intravenous Every 8 hours 01/15/18 1706 01/16/18 0434   01/15/18 1545  ceFAZolin (ANCEF) IVPB 2g/100 mL premix  Status:  Discontinued     2 g 200 mL/hr over 30 Minutes Intravenous Every 8 hours 01/15/18 1530 01/15/18 1706   01/15/18 0630  ceFAZolin (ANCEF) IVPB 2g/100 mL premix     2 g 200 mL/hr over 30 Minutes Intravenous On call to O.R. 01/15/18 8270 01/15/18 1134       V. Charlena Cross, M.D. [facility] [place] Office: [phone] Pager:  [phone]

## 2018-01-16 NOTE — Evaluation (Signed)
Physical Therapy Evaluation Patient Details Name: Jade Price MRN: [id number] DOB: [birth year] Today's Date: 01/16/2018   History of Present Illness  68 y.o. female admitted on 01/15/18 for elective L5-S1 anterior lumbar interbody fusion and R anterior lateral lumbar interbody fusion L1-5.  Pt with significant PMH of RLS, lumbar radiculopathy, arthritis, and anxiety.    Clinical Impression  Pt is POD #1 s/p lumbar fusion surgery and was able to do some short distance gait in her room with RW.  She is subjectively reporting some weakness in her right leg, but reports the radiating pain is not there that was there PTA.  She moves well, min assist overall and I anticipate she will progress well enough to d/c home with home therapy and her husband's assist/supervision at discharge.   PT to follow acutely for deficits listed below.      Follow Up Recommendations Home health PT;Supervision for mobility/OOB    Equipment Recommendations  Rolling walker with 5" wheels    Recommendations for Other Services   NA    Precautions / Restrictions Precautions Precautions: Fall;Back Precaution Booklet Issued: Yes (comment) Precaution Comments: reports R leg weakness.  Back precautions reviewed and handout given      Mobility  Bed Mobility Overal bed mobility: Needs Assistance Bed Mobility: Sit to Sidelying         Sit to sidelying: Min assist;HOB elevated General bed mobility comments: Min assist to help lift legs back into bed in side lying.  Verbal cues for reverse log roll technique.   Transfers Overall transfer level: Needs assistance Equipment used: Rolling walker (2 wheeled) Transfers: Sit to/from Stand Sit to Stand: Min assist         General transfer comment: Min assist to stand from recliner chair x 2, cues for safe hand placement.   Ambulation/Gait Ambulation/Gait assistance: Min assist Gait Distance (Feet): 8 Feet Assistive device: Rolling walker (2 wheeled) Gait  Pattern/deviations: Step-through pattern     General Gait Details: Per pt report R leg feels unstable, she is able to support herself with UEs on RW.  Gait limited by nausea and generalized weakness.  Therapist was pulling chair behind her and she had to sit.  Wanted to get back to bed.  Pt may have been feeling effects of IV dilaudid given at the beginning of PT session.           Balance Overall balance assessment: Needs assistance Sitting-balance support: Feet supported;No upper extremity supported;Bilateral upper extremity supported Sitting balance-Leahy Scale: Fair     Standing balance support: Bilateral upper extremity supported Standing balance-Leahy Scale: Poor Standing balance comment: needs external support from RW and therapist.                              Pertinent Vitals/Pain Pain Assessment: Faces Faces Pain Scale: Hurts whole lot Pain Location: incisional Pain Descriptors / Indicators: Guarding;Grimacing Pain Intervention(s): Limited activity within patient's tolerance;Monitored during session;Repositioned;RN gave pain meds during session    Home Living Family/patient expects to be discharged to:: Private residence Living Arrangements: Spouse/significant other Available Help at Discharge: Family;Available 24 hours/day Type of Home: House Home Access: Stairs to enter Entrance Stairs-Rails: None Entrance Stairs-Number of Steps: 2 Home Layout: Multi-level;Able to live on main level with bedroom/bathroom Home Equipment: Shower seat - built in;Walker - 2 wheels(has been borrowing a RW, but wants their own.)      Prior Function Level of Independence: Independent  Comments: drives, retired, husband reports she has been refusing to use RW, but likely needed to PTA.  No h/o falls     Hand Dominance   Dominant Hand: Right    Extremity/Trunk Assessment   Upper Extremity Assessment Upper Extremity Assessment: Defer to OT evaluation     Lower Extremity Assessment Lower Extremity Assessment: RLE deficits/detail RLE Deficits / Details: pt self reports feeling as though R LE is not stable during gait, h/o radicular symptoms into this leg, none reported currently.  Pt did not show signs of buckling during short distance gait, but had RW for support.  RLE Sensation: history of peripheral neuropathy    Cervical / Trunk Assessment Cervical / Trunk Assessment: Other exceptions Cervical / Trunk Exceptions: post op lumbar fusion  Communication   Communication: No difficulties  Cognition Arousal/Alertness: Awake/alert Behavior During Therapy: WFL for tasks assessed/performed Overall Cognitive Status: Within Functional Limits for tasks assessed                                               Assessment/Plan    PT Assessment Patient needs continued PT services  PT Problem List Decreased strength;Decreased activity tolerance;Decreased balance;Decreased mobility;Decreased knowledge of use of DME;Decreased knowledge of precautions;Pain;Impaired sensation       PT Treatment Interventions DME instruction;Gait training;Stair training;Therapeutic activities;Functional mobility training;Therapeutic exercise;Balance training;Patient/family education;Modalities    PT Goals (Current goals can be found in the Care Plan section)  Acute Rehab PT Goals Patient Stated Goal: to get back to being able to paint and wallpaper her home PT Goal Formulation: With patient/family Time For Goal Achievement: 01/23/18 Potential to Achieve Goals: Good    Frequency Min 5X/week        AM-PAC PT "6 Clicks" Mobility  Outcome Measure Help needed turning from your back to your side while in a flat bed without using bedrails?: A Little Help needed moving from lying on your back to sitting on the side of a flat bed without using bedrails?: A Little Help needed moving to and from a bed to a chair (including a wheelchair)?: A  Little Help needed standing up from a chair using your arms (e.g., wheelchair or bedside chair)?: A Little Help needed to walk in hospital room?: A Little Help needed climbing 3-5 steps with a railing? : A Lot 6 Click Score: 17    End of Session Equipment Utilized During Treatment: Gait belt;Back brace Activity Tolerance: Patient limited by fatigue Patient left: in bed;with call bell/phone within reach;with family/visitor present;with bed alarm set Nurse Communication: Mobility status PT Visit Diagnosis: Muscle weakness (generalized) (M62.81);Difficulty in walking, not elsewhere classified (R26.2);Pain Pain - Right/Left: (incisional) Pain - part of body: (back)    Time: 1610-9604 PT Time Calculation (min) (ACUTE ONLY): 30 min   Charges:          Lurena Joiner B. Rechy Bost, PT, DPT  Acute Rehabilitation [phone] pager #[phone] office   PT Evaluation $PT Eval Moderate Complexity: 1 Mod PT Treatments $Gait Training: 8-22 mins        01/16/2018, 1:07 PM

## 2018-01-16 NOTE — Social Work (Signed)
Per PT note pt doing well functionally. Anticipate return home with her husband.  CSW signing off. Please consult if any additional needs arise.  Doy Hutching, LCSWA [facility] Clinical Social Work [phone]

## 2018-01-16 NOTE — Progress Notes (Addendum)
Subjective: Patient reports "I don;t have any leg pain, just back and side.. kept me from sleeping"  Objective: Vital signs in last 24 hours: Temp:  [97.2 F (36.2 C)-99.8 F (37.7 C)] 99.8 F (37.7 C) (01/10 0400) Pulse Rate:  [67-102] 97 (01/10 0400) Resp:  [13-43] 18 (01/10 0400) BP: (92-162)/(53-97) 92/67 (01/10 0400) SpO2:  [97 %-100 %] 97 % (01/10 0400) Arterial Line BP: (167-183)/(79-95) 183/95 (01/09 1342)  Intake/Output from previous day: 01/09 0701 - 01/10 0700 In: 2490 [P.O.:270; I.V.:2000] Out: 1695 [Urine:1595; Blood:100] Intake/Output this shift: No intake/output data recorded.  Alert, conversant. Reports generalized back pain responding poorly to current meds, but no leg pain. Incisions flat without erythema or drainage. Minimal bruising as expected. No belly tenderness or distention, but feels bloated. Not yet passing gas. Good strength BLE.   Lab Results: No results for input(s): WBC, HGB, HCT, PLT in the last 72 hours. BMET No results for input(s): NA, K, CL, CO2, GLUCOSE, BUN, CREATININE, CALCIUM in the last 72 hours.  Studies/Results: Dg Lumbar Spine Complete  Result Date: 01/15/2018 CLINICAL DATA:  Low back pain. Degenerative scoliosis. EXAM: LUMBAR SPINE - COMPLETE 4+ VIEW; DG C-ARM 61-120 MIN COMPARISON:  Lumbar spine radiograph 08/04/2017. FINDINGS: Intraoperative radiographs demonstrate L5-S1 ALIF. Additionally, L1 through L5 XLIF was performed. IMPRESSION: As above. Electronically Signed   By: Elsie StainJohn T Curnes M.D.   On: 01/15/2018 13:36   Dg C-arm 1-60 Min  Result Date: 01/15/2018 CLINICAL DATA:  Low back pain. Degenerative scoliosis. EXAM: LUMBAR SPINE - COMPLETE 4+ VIEW; DG C-ARM 61-120 MIN COMPARISON:  Lumbar spine radiograph 08/04/2017. FINDINGS: Intraoperative radiographs demonstrate L5-S1 ALIF. Additionally, L1 through L5 XLIF was performed. IMPRESSION: As above. Electronically Signed   By: Elsie StainJohn T Curnes M.D.   On: 01/15/2018 13:36   Dg Scoliosis Eval  Complete Spine 2 Or 3 Views  Result Date: 01/15/2018 CLINICAL DATA:  Thoracolumbar scoliosis and spondylosis. EXAM: DG SCOLIOSIS EVAL COMPLETE SPINE 2-3V COMPARISON:  08/04/2017 lumbar spine FINDINGS: PA view of the thoracolumbar spine was provided. There is 29 degrees dextroconvex curvature of the lumbar spine as measured from superior endplate of T6 through superior endplate of L1. The apex of the curvature is noted at T10. 12 degrees of levoconvex curvature of the lumbar spine is identified as measured from superior endplate level of L2 through superior endplate level of L5 with the apex at L3. L5-S1 ALIF with L1 through L5 XLIF are noted. IMPRESSION: S-shaped scoliosis as above described with 29 degrees of dextroconvex curvature as measured from T6 through L1 and 12 degrees of levoconvex curvature of the lumbar spine as measured from L2 through L5. Electronically Signed   By: Tollie Ethavid  Kwon M.D.   On: 01/15/2018 23:36   Dg Or Local Abdomen  Result Date: 01/15/2018 CLINICAL DATA:  Instrument count. EXAM: OR LOCAL ABDOMEN COMPARISON:  None. FINDINGS: Status post surgical anterior fusion of L5-S1. Residual contrast is seen throughout the colon. No other definite radiopaque foreign body is noted. IMPRESSION: Status post surgical anterior fusion of L5-S1. No other definite radiopaque foreign body is noted. These results were called by telephone at the time of interpretation on 01/15/2018 at 9:49 am to the OR. Electronically Signed   By: Lupita RaiderJames  Green Jr, M.D.   On: 01/15/2018 09:49    Assessment/Plan: Doing well 1st day post-op  LOS: 1 day  Ok per DrStern to add Dilaudid po for better pain control, Dulcolax suppository this am. Mobilize in brace.   Georgiann Cockeroteat, Brian 01/16/2018, 7:32  AM  Plan, based on current correction, is to extend fusion to T 8 level.

## 2018-01-16 NOTE — Evaluation (Signed)
Occupational Therapy Evaluation Patient Details Name: Jade Price MRN: [id number] DOB: 10/28/1950 Today's Date: 01/16/2018    History of Present Illness 68 y.o. female admitted on 01/15/18 for elective L5-S1 anterior lumbar interbody fusion and R anterior lateral lumbar interbody fusion L1-5. 2nd part of fusion on 01/19/2018 per pt. Pt with significant PMH of RLS, lumbar radiculopathy, arthritis, and anxiety.     Clinical Impression   This 68 yo female admitted and underwent above presents to acute OT with increased pain with movement, decreased mobility, back precautions, decreased flexibility due to pain all affecting her PLOF of being independent with basic ADLs and most IADLs. She will benefit from acute OT with follow up HHOT and initially 24 hour S/prn A until she and husband feel she does not need it anymore.     Follow Up Recommendations  Home health OT;Supervision/Assistance - 24 hour    Equipment Recommendations  3 in 1 bedside commode       Precautions / Restrictions Precautions Precautions: Fall;Back Precaution Booklet Issued: Yes (comment) Precaution Comments: reports R leg weakness.  Back precautions reviewed and handout given Restrictions Weight Bearing Restrictions: No      Mobility Bed Mobility Overal bed mobility: Needs Assistance Bed Mobility: Rolling;Sidelying to Sit;Sit to Sidelying Rolling: Supervision Sidelying to sit: Supervision     Sit to sidelying: Supervision General bed mobility comments: VCs for technique and use of rail  Transfers Overall transfer level: Needs assistance Equipment used: Rolling walker (2 wheeled) Transfers: Sit to/from Stand Sit to Stand: Min guard;From elevated surface         General transfer comment: pt's bed at home is high    Balance Overall balance assessment: Needs assistance Sitting-balance support: Bilateral upper extremity supported;Feet supported Sitting balance-Leahy Scale: Poor Sitting balance - Comments:  increased pain when does not use Bil UE for support when sitting EOB   Standing balance support: Bilateral upper extremity supported Standing balance-Leahy Scale: Poor Standing balance comment: needs external support from RW                           ADL either performed or assessed with clinical judgement   ADL Overall ADL's : Needs assistance/impaired Eating/Feeding: Independent;Bed level   Grooming: Set up;Supervision/safety;Bed level   Upper Body Bathing: Supervision/ safety;Set up;Bed level   Lower Body Bathing: Moderate assistance Lower Body Bathing Details (indicate cue type and reason): min A sit<>stand; cannot cross her legs to get to her feet Upper Body Dressing : Moderate assistance;Sitting   Lower Body Dressing: Total assistance Lower Body Dressing Details (indicate cue type and reason): min A sit<>stand; cannot cross her legs to get to her feet Toilet Transfer: Minimal assistance;RW Toilet Transfer Details (indicate cue type and reason): side step up in bed Toileting- Clothing Manipulation and Hygiene: Moderate assistance Toileting - Clothing Manipulation Details (indicate cue type and reason): min A sit<>stand       General ADL Comments: Increased pain when she doesn't use her Bil UEs for support when sitting EOB; max A to to don back brace (increased size of back brace x1 due to pt with difficulty getting brace together in front     Vision Patient Visual Report: No change from baseline              Pertinent Vitals/Pain Pain Assessment: Faces Faces Pain Scale: Hurts whole lot Pain Location: incisional Pain Descriptors / Indicators: Guarding;Grimacing Pain Intervention(s): Limited activity within patient's tolerance;Monitored during session;Repositioned;Patient requesting  pain meds-RN notified(RN came in to give muscle relaxor during session (not time for pain meds))     Hand Dominance Right   Extremity/Trunk Assessment Upper Extremity  Assessment Upper Extremity Assessment: Overall WFL for tasks assessed   Lower Extremity Assessment Lower Extremity Assessment: RLE deficits/detail RLE Deficits / Details: pt self reports feeling as though R LE is not stable during gait, h/o radicular symptoms into this leg, none reported currently.  Pt did not show signs of buckling during short distance gait, but had RW for support.  RLE Sensation: history of peripheral neuropathy   Cervical / Trunk Assessment Cervical / Trunk Assessment: Other exceptions Cervical / Trunk Exceptions: post op lumbar fusion   Communication Communication Communication: No difficulties   Cognition Arousal/Alertness: Awake/alert Behavior During Therapy: WFL for tasks assessed/performed Overall Cognitive Status: Within Functional Limits for tasks assessed                                                Home Living Family/patient expects to be discharged to:: Private residence Living Arrangements: Spouse/significant other Available Help at Discharge: Family;Available 24 hours/day Type of Home: House Home Access: Stairs to enter [employer] of Steps: 2 Entrance Stairs-Rails: None Home Layout: Multi-level;Able to live on main level with bedroom/bathroom     Bathroom Shower/Tub: Walk-in shower;Door   [employer] Toilet: Standard     Home Equipment: Information systems manager - built in;Walker - 2 wheels;Hand held shower head(has been borrowing a RW, wants one of her own)          Prior Functioning/Environment Level of Independence: Independent        Comments: drives, retired, husband reports she has been refusing to use RW, but likely needed to PTA.  No h/o falls. Husband is a primary care/OBGYN doc        OT Problem List: Decreased strength;Decreased range of motion;Decreased activity tolerance;Impaired balance (sitting and/or standing);Pain;Decreased knowledge of precautions;Decreased knowledge of use of DME or AE      OT  Treatment/Interventions: Self-care/ADL training;Balance training;DME and/or AE instruction;Patient/family education    OT Goals(Current goals can be found in the care plan section) Acute Rehab OT Goals Patient Stated Goal: to get pain better OT Goal Formulation: With patient Time For Goal Achievement: 01/30/18 Potential to Achieve Goals: Good  OT Frequency: Min 2X/week              AM-PAC OT "6 Clicks" Daily Activity     Outcome Measure Help from another person eating meals?: None Help from another person taking care of personal grooming?: A Little Help from another person toileting, which includes using toliet, bedpan, or urinal?: A Lot Help from another person bathing (including washing, rinsing, drying)?: A Lot Help from another person to put on and taking off regular upper body clothing?: A Little Help from another person to put on and taking off regular lower body clothing?: Total 6 Click Score: 15   End of Session Equipment Utilized During Treatment: Rolling walker;Back brace Nurse Communication: Patient requests pain meds  Activity Tolerance: Patient limited by pain Patient left: in bed;with call bell/phone within reach;with nursing/sitter in room  OT Visit Diagnosis: Unsteadiness on feet (R26.81);Other abnormalities of gait and mobility (R26.89);Muscle weakness (generalized) (M62.81);Pain Pain - part of body: (incisional)                Time: 1117-3567  OT Time Calculation (min): 34 min Charges:  OT General Charges $OT Visit: 1 Visit OT Evaluation $OT Eval Moderate Complexity: 1 Mod OT Treatments $Self Care/Home Management : 8-22 mins  Ignacia Palmaathy Taeshawn Helfman, OTR/L [facility] Pager [phone] Office [phone]     Evette GeorgesLeonard, Mayla Biddy Eva 01/16/2018, 2:39 PM

## 2018-01-17 MED ORDER — DEXAMETHASONE SODIUM PHOSPHATE 4 MG/ML IJ SOLN
4.0000 mg | Freq: Four times a day (QID) | INTRAMUSCULAR | Status: AC
  Administered 2018-01-17 – 2018-01-18 (×4): 4 mg via INTRAVENOUS
  Filled 2018-01-17 (×4): qty 1

## 2018-01-17 MED ORDER — OXYCODONE HCL 5 MG PO TABS
10.0000 mg | ORAL_TABLET | ORAL | Status: DC | PRN
  Administered 2018-01-17 – 2018-01-20 (×10): 10 mg via ORAL
  Filled 2018-01-17 (×10): qty 2

## 2018-01-17 NOTE — Progress Notes (Signed)
Patient ID: Jade Price, female   DOB: [birth year], 68 y.o.   MRN: [id number] Having quite a bit of soreness.  Complains of significant pain with movement.  Right leg "drags "somewhat when walking.  This is expected with this approach.  Has good strength in bed exam.  OR Monday.

## 2018-01-17 NOTE — Progress Notes (Signed)
Physical Therapy Treatment Patient Details Name: Jade Price MRN: [id number] DOB: [birth year] Today's Date: 01/17/2018    History of Present Illness 68 y.o. female admitted on 01/15/18 for elective L5-S1 anterior lumbar interbody fusion and R anterior lateral lumbar interbody fusion L1-5. 2nd part of fusion on 01/19/2018 per pt. Pt with significant PMH of RLS, lumbar radiculopathy, arthritis, and anxiety.      PT Comments    Patient seen for mobility progression. Pt maintains back precautions with minimal cues during functional mobility and  is making good progress toward PT goals. PT tolerates increased gait distance with min guard assist and RW. Relies on UE support for OOB mobility. Continue to progress as tolerated.    Follow Up Recommendations  Home health PT;Supervision for mobility/OOB     Equipment Recommendations  Rolling walker with 5" wheels    Recommendations for Other Services       Precautions / Restrictions Precautions Precautions: Fall;Back Precaution Booklet Issued: Yes (comment) Precaution Comments: reports R leg weakness.  Back precautions reviewed and handout given    Mobility  Bed Mobility Overal bed mobility: Needs Assistance Bed Mobility: Rolling;Sidelying to Sit;Sit to Sidelying Rolling: Supervision Sidelying to sit: Min guard     Sit to sidelying: Min assist General bed mobility comments: use of rail to roll; carry over of sequencing and technique demonstrated; assist to bring bilat LE into bed  Transfers Overall transfer level: Needs assistance Equipment used: Rolling walker (2 wheeled) Transfers: Sit to/from Stand Sit to Stand: Min guard         General transfer comment: min guard for safety  Ambulation/Gait Ambulation/Gait assistance: Min guard Gait Distance (Feet): 90 Feet Assistive device: Rolling walker (2 wheeled) Gait Pattern/deviations: Step-through pattern Gait velocity: decreased   General Gait Details: cues for upright posture;  slow cadence and guarded but grossly steady gait; increased flexed posture with fatigue so needs cues to maintain    Stairs             Wheelchair Mobility    Modified Rankin (Stroke Patients Only)       Balance Overall balance assessment: Needs assistance Sitting-balance support: Feet supported;Single extremity supported Sitting balance-Leahy Scale: Fair     Standing balance support: Bilateral upper extremity supported Standing balance-Leahy Scale: Poor                              Cognition Arousal/Alertness: Awake/alert Behavior During Therapy: WFL for tasks assessed/performed Overall Cognitive Status: Within Functional Limits for tasks assessed                                        Exercises      General Comments        Pertinent Vitals/Pain Pain Assessment: Faces Faces Pain Scale: Hurts little more Pain Location: back/pelvis and R LE Pain Descriptors / Indicators: Guarding;Sore Pain Intervention(s): Limited activity within patient's tolerance;Monitored during session;Premedicated before session;Repositioned    Home Living                      Prior Function            PT Goals (current goals can now be found in the care plan section) Acute Rehab PT Goals Patient Stated Goal: to get pain better Progress towards PT goals: Progressing toward goals    Frequency  Min 5X/week      PT Plan Current plan remains appropriate    Co-evaluation              AM-PAC PT "6 Clicks" Mobility   Outcome Measure  Help needed turning from your back to your side while in a flat bed without using bedrails?: A Little Help needed moving from lying on your back to sitting on the side of a flat bed without using bedrails?: A Little Help needed moving to and from a bed to a chair (including a wheelchair)?: A Little Help needed standing up from a chair using your arms (e.g., wheelchair or bedside chair)?: A  Little Help needed to walk in hospital room?: A Little Help needed climbing 3-5 steps with a railing? : A Lot 6 Click Score: 17    End of Session Equipment Utilized During Treatment: Gait belt;Back brace Activity Tolerance: Patient tolerated treatment well Patient left: in bed;with call bell/phone within reach Nurse Communication: Mobility status PT Visit Diagnosis: Muscle weakness (generalized) (M62.81);Difficulty in walking, not elsewhere classified (R26.2);Pain Pain - Right/Left: (incisional) Pain - part of body: (back)     Time: [id number] PT Time Calculation (min) (ACUTE ONLY): 22 min  Charges:  $Gait Training: 8-22 mins                     Erline LevineKellyn Kinnley Paulson, PTA Acute Rehabilitation Services Pager: [phone] Office: [phone]     Jade Price 01/17/2018, 5:36 PM

## 2018-01-18 MED ORDER — LORAZEPAM 0.5 MG PO TABS
0.5000 mg | ORAL_TABLET | ORAL | Status: DC | PRN
  Administered 2018-01-18 – 2018-01-23 (×5): 0.5 mg via ORAL
  Filled 2018-01-18 (×6): qty 1

## 2018-01-18 NOTE — Anesthesia Preprocedure Evaluation (Addendum)
Anesthesia Evaluation  Patient identified by MRN, date of birth, ID band Patient awake    Reviewed: Allergy & Precautions, NPO status , Patient's Chart, lab work & pertinent test results  History of Anesthesia Complications (+) PONV and history of anesthetic complications  Airway Mallampati: II  TM Distance: >3 FB Neck ROM: Full    Dental  (+) Missing, Dental Advisory Given,    Pulmonary neg pulmonary ROS,    Pulmonary exam normal breath sounds clear to auscultation       Cardiovascular negative cardio ROS Normal cardiovascular exam Rhythm:Regular Rate:Normal     Neuro/Psych Anxiety negative neurological ROS     GI/Hepatic Neg liver ROS, GERD  Medicated and Controlled,  Endo/Other  negative endocrine ROS  Renal/GU negative Renal ROS     Musculoskeletal  (+) Arthritis , Osteoarthritis,  RLS (restless legs syndrome)   Abdominal   Peds  Hematology negative hematology ROS (+)   Anesthesia Other Findings Scoliosis, and kyphoscoliosis, Idiopathic  Reproductive/Obstetrics                           Anesthesia Physical Anesthesia Plan  ASA: II  Anesthesia Plan: General   Post-op Pain Management:    Induction: Intravenous  PONV Risk Score and Plan: 4 or greater and Scopolamine patch - Pre-op, Midazolam, Dexamethasone, Ondansetron and Treatment may vary due to age or medical condition  Airway Management Planned: Oral ETT  Additional Equipment: Arterial line  Intra-op Plan:   Post-operative Plan: Extubation in OR  Informed Consent: I have reviewed the patients History and Physical, chart, labs and discussed the procedure including the risks, benefits and alternatives for the proposed anesthesia with the patient or authorized representative who has indicated his/her understanding and acceptance.   Dental advisory given  Plan Discussed with: CRNA, Anesthesiologist and  Surgeon  Anesthesia Plan Comments:       Anesthesia Quick Evaluation

## 2018-01-18 NOTE — Progress Notes (Signed)
  NEUROSURGERY PROGRESS NOTE   No issues overnight.  Complains of right hip pain and weakness and significant anxiety for surgery part 2 tomorrow Ambulating with nursing  EXAM:  BP 130/64   Pulse (!) 105   Temp 98.8 F (37.1 C) (Oral)   Resp 16   Ht 5\' 5"  (1.651 m)   Wt 65.8 kg   SpO2 100%   BMI 24.13 kg/m   Awake, alert, oriented  Speech fluent, appropriate  CN grossly intact  MAEW, right HF weakness as expected  PLAN Stable this am Reassurance provided for tomorrow. Will add low dose ativan for anxiousness NPO at midnight for surgery tomorrow

## 2018-01-19 ENCOUNTER — Inpatient Hospital Stay (HOSPITAL_COMMUNITY): Payer: Medicare Other | Admitting: Anesthesiology

## 2018-01-19 ENCOUNTER — Inpatient Hospital Stay (HOSPITAL_COMMUNITY): Admission: RE | Admit: 2018-01-19 | Payer: Medicare Other | Source: Home / Self Care | Admitting: Neurosurgery

## 2018-01-19 ENCOUNTER — Encounter (HOSPITAL_COMMUNITY)
Admission: RE | Disposition: A | Discharge status: Rehabilitation Facility | Payer: Self-pay | Source: Home / Self Care | Attending: Neurosurgery

## 2018-01-19 ENCOUNTER — Inpatient Hospital Stay (HOSPITAL_COMMUNITY): Payer: Medicare Other

## 2018-01-19 HISTORY — PX: APPLICATION OF INTRAOPERATIVE CT SCAN: SHX6668

## 2018-01-19 HISTORY — PX: POSTERIOR LUMBAR FUSION 4 LEVEL: SHX6037

## 2018-01-19 SURGERY — POSTERIOR LUMBAR FUSION 4 LEVEL
Anesthesia: General | Site: Back

## 2018-01-19 MED ORDER — PHENOL 1.4 % MT LIQD: 1.0000 | OROMUCOSAL | Status: DC | PRN

## 2018-01-19 MED ORDER — SODIUM CHLORIDE 0.9% FLUSH: 9.0000 mL | INTRAVENOUS | Status: DC | PRN

## 2018-01-19 MED ORDER — HYDROMORPHONE HCL 1 MG/ML IJ SOLN
INTRAMUSCULAR | Status: AC
  Administered 2018-01-19: 0.5 mg
  Filled 2018-01-19: qty 1

## 2018-01-19 MED ORDER — ROCURONIUM BROMIDE 10 MG/ML (PF) SYRINGE
PREFILLED_SYRINGE | INTRAVENOUS | Status: DC | PRN
  Administered 2018-01-19: 10 mg via INTRAVENOUS
  Administered 2018-01-19: 50 mg via INTRAVENOUS
  Administered 2018-01-19 (×2): 10 mg via INTRAVENOUS
  Administered 2018-01-19: 20 mg via INTRAVENOUS
  Administered 2018-01-19 (×2): 10 mg via INTRAVENOUS

## 2018-01-19 MED ORDER — KCL IN DEXTROSE-NACL 20-5-0.45 MEQ/L-%-% IV SOLN
INTRAVENOUS | Status: DC
  Administered 2018-01-19 – 2018-01-20 (×2): via INTRAVENOUS
  Filled 2018-01-19: qty 1000

## 2018-01-19 MED ORDER — DIAZEPAM 5 MG PO TABS
5.0000 mg | ORAL_TABLET | Freq: Four times a day (QID) | ORAL | Status: DC | PRN
  Administered 2018-01-21 – 2018-01-23 (×5): 5 mg via ORAL
  Filled 2018-01-19 (×6): qty 1

## 2018-01-19 MED ORDER — HYDROMORPHONE HCL 2 MG PO TABS
2.0000 mg | ORAL_TABLET | ORAL | Status: DC | PRN
  Administered 2018-01-20 – 2018-01-21 (×4): 2 mg via ORAL
  Filled 2018-01-19 (×4): qty 1

## 2018-01-19 MED ORDER — ZOLPIDEM TARTRATE 5 MG PO TABS: 5.0000 mg | ORAL_TABLET | Freq: Every evening | ORAL | Status: DC | PRN

## 2018-01-19 MED ORDER — LACTATED RINGERS IV SOLN
INTRAVENOUS | Status: DC | PRN
  Administered 2018-01-19 (×3): via INTRAVENOUS

## 2018-01-19 MED ORDER — OXYCODONE HCL 5 MG PO TABS: 5.0000 mg | ORAL_TABLET | ORAL | Status: DC | PRN

## 2018-01-19 MED ORDER — ONDANSETRON HCL 4 MG/2ML IJ SOLN: 4.0000 mg | Freq: Four times a day (QID) | INTRAMUSCULAR | Status: DC | PRN

## 2018-01-19 MED ORDER — BISACODYL 10 MG RE SUPP: 10.0000 mg | Freq: Every day | RECTAL | Status: DC | PRN

## 2018-01-19 MED ORDER — SODIUM CHLORIDE (PF) 0.9 % IJ SOLN
INTRAMUSCULAR | Status: DC | PRN
  Administered 2018-01-19: 40 mL

## 2018-01-19 MED ORDER — DEXAMETHASONE SODIUM PHOSPHATE 10 MG/ML IJ SOLN
INTRAMUSCULAR | Status: DC | PRN
  Administered 2018-01-19: 10 mg via INTRAVENOUS

## 2018-01-19 MED ORDER — LACTATED RINGERS IV SOLN
INTRAVENOUS | Status: DC | PRN
  Administered 2018-01-19 (×2): via INTRAVENOUS

## 2018-01-19 MED ORDER — TRANEXAMIC ACID 1000 MG/10ML IV SOLN
2000.0000 mg | INTRAVENOUS | Status: AC
  Administered 2018-01-19: 2000 mg via TOPICAL
  Filled 2018-01-19: qty 20

## 2018-01-19 MED ORDER — CEFAZOLIN SODIUM-DEXTROSE 2-4 GM/100ML-% IV SOLN
2.0000 g | Freq: Three times a day (TID) | INTRAVENOUS | Status: AC
  Administered 2018-01-19 – 2018-01-20 (×2): 2 g via INTRAVENOUS
  Filled 2018-01-19 (×2): qty 100

## 2018-01-19 MED ORDER — FLEET ENEMA 7-19 GM/118ML RE ENEM: 1.0000 | ENEMA | Freq: Once | RECTAL | Status: DC | PRN

## 2018-01-19 MED ORDER — ONDANSETRON HCL 4 MG PO TABS: 4.0000 mg | ORAL_TABLET | Freq: Four times a day (QID) | ORAL | Status: DC | PRN

## 2018-01-19 MED ORDER — LIDOCAINE HCL (CARDIAC) PF 100 MG/5ML IV SOSY
PREFILLED_SYRINGE | INTRAVENOUS | Status: DC | PRN
  Administered 2018-01-19: 60 mg via INTRAVENOUS

## 2018-01-19 MED ORDER — BUPIVACAINE LIPOSOME 1.3 % IJ SUSP
20.0000 mL | INTRAMUSCULAR | Status: AC
  Administered 2018-01-19: 20 mL
  Filled 2018-01-19: qty 20

## 2018-01-19 MED ORDER — CHLORHEXIDINE GLUCONATE CLOTH 2 % EX PADS: 6.0000 | MEDICATED_PAD | Freq: Once | CUTANEOUS | Status: DC

## 2018-01-19 MED ORDER — PROMETHAZINE HCL 25 MG/ML IJ SOLN: 6.2500 mg | INTRAMUSCULAR | Status: DC | PRN

## 2018-01-19 MED ORDER — SODIUM CHLORIDE 0.9% FLUSH: 3.0000 mL | INTRAVENOUS | Status: DC | PRN

## 2018-01-19 MED ORDER — HYDROMORPHONE HCL 1 MG/ML IJ SOLN: 1.0000 mg | INTRAMUSCULAR | Status: DC | PRN

## 2018-01-19 MED ORDER — LACTATED RINGERS IV SOLN: INTRAVENOUS | Status: DC | PRN

## 2018-01-19 MED ORDER — DIPHENHYDRAMINE HCL 12.5 MG/5ML PO ELIX: 12.5000 mg | ORAL_SOLUTION | Freq: Four times a day (QID) | ORAL | Status: DC | PRN

## 2018-01-19 MED ORDER — PROMETHAZINE HCL 25 MG/ML IJ SOLN
INTRAMUSCULAR | Status: AC
  Administered 2018-01-19: 6.25 mg
  Filled 2018-01-19: qty 1

## 2018-01-19 MED ORDER — ALUM & MAG HYDROXIDE-SIMETH 200-200-20 MG/5ML PO SUSP: 30.0000 mL | Freq: Four times a day (QID) | ORAL | Status: DC | PRN

## 2018-01-19 MED ORDER — DOCUSATE SODIUM 100 MG PO CAPS: 100.0000 mg | ORAL_CAPSULE | Freq: Two times a day (BID) | ORAL | Status: DC

## 2018-01-19 MED ORDER — SUGAMMADEX SODIUM 200 MG/2ML IV SOLN
INTRAVENOUS | Status: DC | PRN
  Administered 2018-01-19: 200 mg via INTRAVENOUS

## 2018-01-19 MED ORDER — PANTOPRAZOLE SODIUM 40 MG IV SOLR: 40.0000 mg | Freq: Every day | INTRAVENOUS | Status: DC

## 2018-01-19 MED ORDER — CEFAZOLIN SODIUM-DEXTROSE 2-4 GM/100ML-% IV SOLN
2.0000 g | INTRAVENOUS | Status: AC
  Administered 2018-01-19 (×2): 2 g via INTRAVENOUS

## 2018-01-19 MED ORDER — ONDANSETRON HCL 4 MG/2ML IJ SOLN
INTRAMUSCULAR | Status: DC | PRN
  Administered 2018-01-19: 4 mg via INTRAVENOUS

## 2018-01-19 MED ORDER — MIDAZOLAM HCL 5 MG/5ML IJ SOLN
INTRAMUSCULAR | Status: DC | PRN
  Administered 2018-01-19: 2 mg via INTRAVENOUS

## 2018-01-19 MED ORDER — DIPHENHYDRAMINE HCL 50 MG/ML IJ SOLN: 12.5000 mg | Freq: Four times a day (QID) | INTRAMUSCULAR | Status: DC | PRN

## 2018-01-19 MED ORDER — BUPIVACAINE HCL (PF) 0.5 % IJ SOLN
INTRAMUSCULAR | Status: DC | PRN
  Administered 2018-01-19: 10 mL

## 2018-01-19 MED ORDER — MENTHOL 3 MG MT LOZG: 1.0000 | LOZENGE | OROMUCOSAL | Status: DC | PRN

## 2018-01-19 MED ORDER — LIDOCAINE-EPINEPHRINE 1 %-1:100000 IJ SOLN
INTRAMUSCULAR | Status: DC | PRN
  Administered 2018-01-19: 10 mL

## 2018-01-19 MED ORDER — KCL IN DEXTROSE-NACL 20-5-0.45 MEQ/L-%-% IV SOLN
INTRAVENOUS | Status: AC
  Filled 2018-01-19: qty 1000

## 2018-01-19 MED ORDER — HYDROMORPHONE HCL 1 MG/ML IJ SOLN: 0.2500 mg | INTRAMUSCULAR | Status: DC | PRN

## 2018-01-19 MED ORDER — CHLORHEXIDINE GLUCONATE CLOTH 2 % EX PADS
6.0000 | MEDICATED_PAD | Freq: Once | CUTANEOUS | Status: AC
  Administered 2018-01-19: 6 via TOPICAL

## 2018-01-19 MED ORDER — FENTANYL CITRATE (PF) 100 MCG/2ML IJ SOLN
INTRAMUSCULAR | Status: DC | PRN
  Administered 2018-01-19: 25 ug via INTRAVENOUS
  Administered 2018-01-19: 50 ug via INTRAVENOUS
  Administered 2018-01-19: 100 ug via INTRAVENOUS
  Administered 2018-01-19: 25 ug via INTRAVENOUS
  Administered 2018-01-19 (×4): 50 ug via INTRAVENOUS

## 2018-01-19 MED ORDER — SODIUM CHLORIDE 0.9% FLUSH
3.0000 mL | Freq: Two times a day (BID) | INTRAVENOUS | Status: DC
  Administered 2018-01-19 – 2018-01-23 (×7): 3 mL via INTRAVENOUS

## 2018-01-19 MED ORDER — ACETAMINOPHEN 325 MG PO TABS: 650.0000 mg | ORAL_TABLET | ORAL | Status: DC | PRN

## 2018-01-19 MED ORDER — SCOPOLAMINE 1 MG/3DAYS TD PT72
MEDICATED_PATCH | TRANSDERMAL | Status: DC | PRN
  Administered 2018-01-19: 1 via TRANSDERMAL

## 2018-01-19 MED ORDER — PHENYLEPHRINE 40 MCG/ML (10ML) SYRINGE FOR IV PUSH (FOR BLOOD PRESSURE SUPPORT)
PREFILLED_SYRINGE | INTRAVENOUS | Status: DC | PRN
  Administered 2018-01-19 (×3): 80 ug via INTRAVENOUS
  Administered 2018-01-19: 120 ug via INTRAVENOUS

## 2018-01-19 MED ORDER — HYDROMORPHONE 1 MG/ML IV SOLN
INTRAVENOUS | Status: DC
  Administered 2018-01-19: 25 mg via INTRAVENOUS
  Filled 2018-01-19: qty 25

## 2018-01-19 MED ORDER — SODIUM CHLORIDE 0.9 % IV SOLN: 250.0000 mL | INTRAVENOUS | Status: DC

## 2018-01-19 MED ORDER — NALOXONE HCL 0.4 MG/ML IJ SOLN: 0.4000 mg | INTRAMUSCULAR | Status: DC | PRN

## 2018-01-19 MED ORDER — OXYCODONE HCL 5 MG PO TABS
10.0000 mg | ORAL_TABLET | ORAL | Status: DC | PRN
  Administered 2018-01-20: 10 mg via ORAL
  Filled 2018-01-19: qty 2

## 2018-01-19 MED ORDER — ACETAMINOPHEN 650 MG RE SUPP: 650.0000 mg | RECTAL | Status: DC | PRN

## 2018-01-19 MED ORDER — 0.9 % SODIUM CHLORIDE (POUR BTL) OPTIME
TOPICAL | Status: DC | PRN
  Administered 2018-01-19: 1000 mL

## 2018-01-19 MED ORDER — THROMBIN 5000 UNITS EX SOLR
OROMUCOSAL | Status: DC | PRN
  Administered 2018-01-19: 10:00:00 via TOPICAL

## 2018-01-19 MED ORDER — SODIUM CHLORIDE 0.9 % IV SOLN
INTRAVENOUS | Status: DC | PRN
  Administered 2018-01-19: 20 ug/min via INTRAVENOUS

## 2018-01-19 MED ORDER — PROPOFOL 10 MG/ML IV BOLUS
INTRAVENOUS | Status: DC | PRN
  Administered 2018-01-19: 150 mg via INTRAVENOUS

## 2018-01-19 MED ORDER — POLYETHYLENE GLYCOL 3350 17 G PO PACK: 17.0000 g | PACK | Freq: Every day | ORAL | Status: DC | PRN

## 2018-01-19 SURGICAL SUPPLY — 95 items
BAG DECANTER FOR FLEXI CONT (MISCELLANEOUS) ×2 IMPLANT
BASKET BONE COLLECTION (BASKET) ×4 IMPLANT
BLADE CLIPPER SURG (BLADE) IMPLANT
BONE CANC CHIPS 40CC CAN1/2 (Bone Implant) ×12 IMPLANT
BUR MATCHSTICK NEURO 3.0 LAGG (BURR) ×4 IMPLANT
BUR PRECISION FLUTE 5.0 (BURR) ×4 IMPLANT
CANISTER SUCT 3000ML PPV (MISCELLANEOUS) ×4 IMPLANT
CARTRIDGE OIL MAESTRO DRILL (MISCELLANEOUS) ×4 IMPLANT
CHIPS CANC BONE 40CC CAN1/2 (Bone Implant) ×6 IMPLANT
CONNECTOR RELINE 30MM OPEN OFF (Connector) ×4 IMPLANT
CONT SPEC 4OZ CLIKSEAL STRL BL (MISCELLANEOUS) ×6 IMPLANT
COVER BACK TABLE 60X90IN (DRAPES) ×8 IMPLANT
COVER WAND RF STERILE (DRAPES) ×4 IMPLANT
DECANTER SPIKE VIAL GLASS SM (MISCELLANEOUS) ×2 IMPLANT
DERMABOND ADVANCED (GAUZE/BANDAGES/DRESSINGS) ×4
DERMABOND ADVANCED .7 DNX12 (GAUZE/BANDAGES/DRESSINGS) ×2 IMPLANT
DIFFUSER DRILL AIR PNEUMATIC (MISCELLANEOUS) ×6 IMPLANT
DIGITIZER BENDINI (MISCELLANEOUS) ×2 IMPLANT
DRAPE C-ARM 42X72 X-RAY (DRAPES) ×2 IMPLANT
DRAPE C-ARMOR (DRAPES) ×2 IMPLANT
DRAPE LAPAROTOMY 100X72X124 (DRAPES) ×4 IMPLANT
DRAPE SCAN PATIENT (DRAPES) ×6 IMPLANT
DRAPE SHEET LG 3/4 BI-LAMINATE (DRAPES) ×2 IMPLANT
DRAPE SURG 17X23 STRL (DRAPES) ×4 IMPLANT
DRSG OPSITE POSTOP 4X12 (GAUZE/BANDAGES/DRESSINGS) ×4 IMPLANT
DURAPREP 26ML APPLICATOR (WOUND CARE) ×4 IMPLANT
ELECT REM PT RETURN 9FT ADLT (ELECTROSURGICAL) ×4
ELECTRODE REM PT RTRN 9FT ADLT (ELECTROSURGICAL) ×2 IMPLANT
EVACUATOR 1/8 PVC DRAIN (DRAIN) ×2 IMPLANT
GAUZE 4X4 16PLY RFD (DISPOSABLE) ×4 IMPLANT
GAUZE SPONGE 4X4 12PLY STRL (GAUZE/BANDAGES/DRESSINGS) ×2 IMPLANT
GLOVE BIO SURGEON STRL SZ8 (GLOVE) ×14 IMPLANT
GLOVE BIOGEL PI IND STRL 7.5 (GLOVE) IMPLANT
GLOVE BIOGEL PI IND STRL 8 (GLOVE) ×4 IMPLANT
GLOVE BIOGEL PI IND STRL 8.5 (GLOVE) ×4 IMPLANT
GLOVE BIOGEL PI INDICATOR 7.5 (GLOVE) ×8
GLOVE BIOGEL PI INDICATOR 8 (GLOVE) ×6
GLOVE BIOGEL PI INDICATOR 8.5 (GLOVE) ×8
GLOVE ECLIPSE 8.0 STRL XLNG CF (GLOVE) ×10 IMPLANT
GLOVE ECLIPSE 8.5 STRL (GLOVE) ×2 IMPLANT
GLOVE SURG SS PI 7.0 STRL IVOR (GLOVE) ×8 IMPLANT
GOWN STRL REUS W/ TWL LRG LVL3 (GOWN DISPOSABLE) IMPLANT
GOWN STRL REUS W/ TWL XL LVL3 (GOWN DISPOSABLE) ×4 IMPLANT
GOWN STRL REUS W/TWL 2XL LVL3 (GOWN DISPOSABLE) ×12 IMPLANT
GOWN STRL REUS W/TWL LRG LVL3 (GOWN DISPOSABLE) ×10
GOWN STRL REUS W/TWL XL LVL3 (GOWN DISPOSABLE) ×6
GRAFT BNE CHIP CANC 1-8 40 (Bone Implant) IMPLANT
HEMOSTAT POWDER KIT SURGIFOAM (HEMOSTASIS) ×4 IMPLANT
KIT BASIN OR (CUSTOM PROCEDURE TRAY) ×4 IMPLANT
KIT INFUSE MEDIUM (Orthopedic Implant) ×2 IMPLANT
KIT POSITION SURG JACKSON T1 (MISCELLANEOUS) ×4 IMPLANT
KIT TURNOVER KIT B (KITS) ×4 IMPLANT
MARKER SKIN DUAL TIP RULER LAB (MISCELLANEOUS) ×2 IMPLANT
MARKER SPHERE PSV REFLC 13MM (MARKER) ×26 IMPLANT
MILL MEDIUM DISP (BLADE) ×2 IMPLANT
MODULE POWER NUVASIVE (MISCELLANEOUS) IMPLANT
NDL HYPO 21X1.5 SAFETY (NEEDLE) IMPLANT
NDL HYPO 25X1 1.5 SAFETY (NEEDLE) ×2 IMPLANT
NDL SPNL 18GX3.5 QUINCKE PK (NEEDLE) IMPLANT
NEEDLE HYPO 21X1.5 SAFETY (NEEDLE) ×4 IMPLANT
NEEDLE HYPO 25X1 1.5 SAFETY (NEEDLE) ×4 IMPLANT
NEEDLE SPNL 18GX3.5 QUINCKE PK (NEEDLE) ×8 IMPLANT
NS IRRIG 1000ML POUR BTL (IV SOLUTION) ×4 IMPLANT
OIL CARTRIDGE MAESTRO DRILL (MISCELLANEOUS) ×4
PACK LAMINECTOMY NEURO (CUSTOM PROCEDURE TRAY) ×4 IMPLANT
PAD ARMBOARD 7.5X6 YLW CONV (MISCELLANEOUS) ×14 IMPLANT
PATTIES SURGICAL .5 X.5 (GAUZE/BANDAGES/DRESSINGS) IMPLANT
PATTIES SURGICAL .5 X1 (DISPOSABLE) IMPLANT
PATTIES SURGICAL 1X1 (DISPOSABLE) IMPLANT
POWER MODULE NUVASIVE (MISCELLANEOUS) ×4
ROD RELINE 5.5X500MM STRAIGHT (Rod) ×4 IMPLANT
SCREW LOCK RELINE 5.5 TULIP (Screw) ×52 IMPLANT
SCREW RELINE O POLY 2 5.0X50 (Screw) ×2 IMPLANT
SCREW RELINE POLY 5.5X50MM (Screw) ×6 IMPLANT
SCREW RELINE-O POLY 5.0X45MM (Screw) ×2 IMPLANT
SCREW RELINE-O POLY 5.5X45MM (Screw) ×10 IMPLANT
SCREW RELINE-O POLY 6.5X45 (Screw) ×14 IMPLANT
SCREW RELINE-O POLY 6.5X50MM (Screw) ×2 IMPLANT
SCREW RELINE-O POLY 7.5X40 (Screw) ×2 IMPLANT
SCREW RELINE-O POLY 7.5X45 (Screw) ×6 IMPLANT
SCREW RELINE-O POLY 8.5C70MM (Screw) ×4 IMPLANT
SPONGE LAP 4X18 RFD (DISPOSABLE) ×2 IMPLANT
SPONGE SURGIFOAM ABS GEL 100 (HEMOSTASIS) IMPLANT
STAPLER SKIN PROX WIDE 3.9 (STAPLE) IMPLANT
SUT VIC AB 1 CT1 18XBRD ANBCTR (SUTURE) ×4 IMPLANT
SUT VIC AB 1 CT1 8-18 (SUTURE) ×10
SUT VIC AB 2-0 CT1 18 (SUTURE) ×10 IMPLANT
SUT VIC AB 3-0 SH 8-18 (SUTURE) ×14 IMPLANT
SYR 5ML LL (SYRINGE) IMPLANT
SYRINGE 20CC LL (MISCELLANEOUS) ×8 IMPLANT
TOWEL GREEN STERILE (TOWEL DISPOSABLE) ×4 IMPLANT
TOWEL GREEN STERILE FF (TOWEL DISPOSABLE) ×4 IMPLANT
TRAP SPECIMEN MUCOUS 40CC (MISCELLANEOUS) ×2 IMPLANT
TRAY FOLEY MTR SLVR 16FR STAT (SET/KITS/TRAYS/PACK) ×4 IMPLANT
WATER STERILE IRR 1000ML POUR (IV SOLUTION) ×4 IMPLANT

## 2018-01-19 NOTE — Anesthesia Procedure Notes (Signed)
Procedure Name: Intubation Date/Time: 01/19/2018 8:32 AM Performed by: Carney Living, CRNA Pre-anesthesia Checklist: Emergency Drugs available, Patient identified, Suction available, Patient being monitored and Timeout performed Patient Re-evaluated:Patient Re-evaluated prior to induction Oxygen Delivery Method: Circle system utilized Preoxygenation: Pre-oxygenation with 100% oxygen Induction Type: IV induction Ventilation: Mask ventilation without difficulty Laryngoscope Size: Mac and 4 Grade View: Grade I Tube type: Oral Tube size: 7.0 mm Airway Equipment and Method: Stylet Placement Confirmation: ETT inserted through vocal cords under direct vision,  breath sounds checked- equal and bilateral and positive ETCO2 Secured at: 22 cm Tube secured with: Tape Dental Injury: Teeth and Oropharynx as per pre-operative assessment

## 2018-01-19 NOTE — Brief Op Note (Signed)
01/19/2018  2:07 PM  PATIENT:  Jade Price  67 y.o. female  PRE-OPERATIVE DIAGNOSIS:  Scoliosis, and kyphoscoliosis, Idiopathic, spondylolisthesis, stenosis, radiculopathy, lumbago  POST-OPERATIVE DIAGNOSIS:  Scoliosis, and kyphoscoliosis, Idiopathic, spondylolisthesis, stenosis, radiculopathy, lumbago  PROCEDURE:  Procedure(s): Thoracic Eight to pelvis fixation with Airo (N/A) APPLICATION OF INTRAOPERATIVE CT SCAN (N/A) with osteotomies L 12, L 23, L 34 levels, pedicle screw fixation, scoliotic curvature correction, posterolateral arthrodesis with autograft, allograft  SURGEON:  Surgeon(s) and Role:    * Tanessa Tidd, MD - Primary    * Elsner, Henry, MD - Assisting  PHYSICIAN ASSISTANT:   ASSISTANTS: Poteat, RN   ANESTHESIA:   general  EBL:  300 mL   BLOOD ADMINISTERED:none  DRAINS: (medium) Hemovact drain(s) in the paravertebral space with  Suction Open   LOCAL MEDICATIONS USED:  MARCAINE    and LIDOCAINE   SPECIMEN:  No Specimen  DISPOSITION OF SPECIMEN:  N/A  COUNTS:  YES  TOURNIQUET:  * No tourniquets in log *   DICTATION: Patient is 67-year-old woman who has undergone Stage I ALIF and XLIF who now presents for Stage II  fusion T 8 to Ilium.  It was elected to take her to surgery for fusion from T 8 through ilium.  She has severe scoliosis with significant persistent coronal imbalance despite anterior and lateral correction done in Stage I.  Procedure: Patient was placed in a prone position on the Jackson table attachment for the Airo intra-op CT/ navigation system after smooth and uncomplicated induction of general endotracheal anesthesia. Her back was  prepped and draped in usual sterile fashion with betadine scrub and DuraPrep. Area of incision was infiltrated with local lidocaine. Incision was made to the lumbodorsal fascia was incised and exposure was performed of the T 8 to the ilium bilaterally.  Intraoperative CT scanwas obtained which confirmed correct  orientation with marker probes at T 8 through ilium levels.   There was good correction of scoliotic curvature. The posterolateral region was extensively decorticated and pedicle screws were placed at T 8  through ilium bilaterally using intraoperative navigation.  Iliac screws: 8.5 x 70; S1: 7.5 x 45 left and 7. 5 x 40 right; L 5:  7.5 x 45; L 4: 6.5 x 50 left and 6.5 x 45 right; L 3: 6.5 x 45; L 2 and L 1: 5.5 x 45; T 12 and T 11: 6.5 x 45; T 9 and T 10: 5.5 x 50; T 8: 5.5 x 45 left and 5.0 x 45 right.  Offset connectors were affixed to pelvic bolt screw heads. An intra-operative CT spin was performed with visualization of all screws.  All screws appeared to be well-positioned. Osteotomies of L 12, L 23, L 34 levels were performed bilaterally. Bendini rod bending system was utilized and rods were bent, affixed to screw heads and locked down with coronal correction (compression left L 12, L 23, L 34 levels and distraction on the right at the same levels).  5.5 x 330 rod on the left and 5.5 x 335 mm rod on the right.  Autograft was harvested from both ilia for use in fusion and the spinous processes and laminae at L 3 and L 4 levels. along with marrow rich blood. Rods were cut, bent and locked down bilaterally in situ and the posterolateral region was packed with autograft, medium BMP and 100 cc of allograft after decorticating the posterolateral region. The wound was irrigated. A medium Hemovac drain was placed. Long-acting Marcaine was injected   into the paraspinous musculature.  Fascia was closed with 1 Vicryl sutures skin edges were reapproximated 2 and 3-0 Vicryl sutures. The wound was dressed with Dermabond and an occlusive dressing.  the patient was extubated in the operating room and taken to recovery in stable satisfactory condition she tolerated the operation well counts were correct at the end of the case.    PLAN OF CARE: Admit to inpatient   PATIENT DISPOSITION:  PACU - hemodynamically stable.    Delay start of Pharmacological VTE agent (>24hrs) due to surgical blood loss or risk of bleeding: yes

## 2018-01-19 NOTE — Progress Notes (Signed)
Patient is awake, alert, conversant.  She is appropriately sore in her back.  She denies leg pain or numbness and is moving both of her legs with good power.

## 2018-01-19 NOTE — Progress Notes (Signed)
Orthopedic Tech Progress Note Patient Details:  Neeva Deleonardis [birth year] [id number] Patient had brace already   Patient ID: Aurora Mask, female   DOB: 04/08/1950, 68 y.o.   MRN: [id number]   Donald Pore 01/19/2018, 5:04 PM

## 2018-01-19 NOTE — Interval H&P Note (Signed)
History and Physical Interval Note:  01/19/2018 7:27 AM  Jade Price  has presented today for surgery, with the diagnosis of Scoliosis, and kyphoscoliosis, Idiopathic  The various methods of treatment have been discussed with the patient and family. After consideration of risks, benefits and other options for treatment, the patient has consented to  Procedure(s) with comments: Thoracic Ten to pelvis fixation with Airo (N/A) - Thoracic Eight to pelvis fixation with Airo APPLICATION OF INTRAOPERATIVE CT SCAN (N/A) as a surgical intervention .  The patient's history has been reviewed, patient examined, no change in status, stable for surgery.  I have reviewed the patient's chart and labs.  Questions were answered to the patient's satisfaction.     Dorian Heckle

## 2018-01-19 NOTE — Transfer of Care (Signed)
Immediate Anesthesia Transfer of Care Note  Patient: Jade Price  Procedure(s) Performed: Thoracic Eight to pelvis fixation with Airo (N/A Back) APPLICATION OF INTRAOPERATIVE CT SCAN (N/A )  Patient Location: PACU  Anesthesia Type:General  Level of Consciousness: awake, alert , oriented and patient cooperative  Airway & Oxygen Therapy: Patient Spontanous Breathing and Patient connected to nasal cannula oxygen  Post-op Assessment: Report given to RN, Post -op Vital signs reviewed and stable and Patient moving all extremities X 4  Post vital signs: Reviewed and stable  Last Vitals:  Vitals Value Taken Time  BP 115/72 01/19/2018  2:29 PM  Temp    Pulse 104 01/19/2018  2:34 PM  Resp 12 01/19/2018  2:34 PM  SpO2 100 % 01/19/2018  2:34 PM  Vitals shown include unvalidated device data.  Last Pain:  Vitals:   01/19/18 0610  TempSrc:   PainSc: 9       Patients Stated Pain Goal: 2 (01/18/18 1915)  Complications: No apparent anesthesia complications

## 2018-01-19 NOTE — Op Note (Signed)
01/19/2018  2:07 PM  PATIENT:  Jade Price  68 y.o. female  PRE-OPERATIVE DIAGNOSIS:  Scoliosis, and kyphoscoliosis, Idiopathic, spondylolisthesis, stenosis, radiculopathy, lumbago  POST-OPERATIVE DIAGNOSIS:  Scoliosis, and kyphoscoliosis, Idiopathic, spondylolisthesis, stenosis, radiculopathy, lumbago  PROCEDURE:  Procedure(s): Thoracic Eight to pelvis fixation with Airo (N/A) APPLICATION OF INTRAOPERATIVE CT SCAN (N/A) with osteotomies L 12, L 23, L 34 levels, pedicle screw fixation, scoliotic curvature correction, posterolateral arthrodesis with autograft, allograft  SURGEON:  Surgeon(s) and Role:    Maeola Harman, MD - Primary    Barnett Abu, MD - Assisting  PHYSICIAN ASSISTANT:   ASSISTANTS: Poteat, RN   ANESTHESIA:   general  EBL:  300 mL   BLOOD ADMINISTERED:none  DRAINS: (medium) Hemovact drain(s) in the paravertebral space with  Suction Open   LOCAL MEDICATIONS USED:  MARCAINE    and LIDOCAINE   SPECIMEN:  No Specimen  DISPOSITION OF SPECIMEN:  N/A  COUNTS:  YES  TOURNIQUET:  * No tourniquets in log *   DICTATION: Patient is 68 year old woman who has undergone Stage I ALIF and XLIF who now presents for Stage II  fusion T 8 to Ilium.  It was elected to take her to surgery for fusion from T 8 through ilium.  She has severe scoliosis with significant persistent coronal imbalance despite anterior and lateral correction done in Stage I.  Procedure: Patient was placed in a prone position on the Alpine table attachment for the Airo intra-op CT/ navigation system after smooth and uncomplicated induction of general endotracheal anesthesia. Her back was  prepped and draped in usual sterile fashion with betadine scrub and DuraPrep. Area of incision was infiltrated with local lidocaine. Incision was made to the lumbodorsal fascia was incised and exposure was performed of the T 8 to the ilium bilaterally.  Intraoperative CT scanwas obtained which confirmed correct  orientation with marker probes at T 8 through ilium levels.   There was good correction of scoliotic curvature. The posterolateral region was extensively decorticated and pedicle screws were placed at T 8  through ilium bilaterally using intraoperative navigation.  Iliac screws: 8.5 x 70; S1: 7.5 x 45 left and 7. 5 x 40 right; L 5:  7.5 x 45; L 4: 6.5 x 50 left and 6.5 x 45 right; L 3: 6.5 x 45; L 2 and L 1: 5.5 x 45; T 12 and T 11: 6.5 x 45; T 9 and T 10: 5.5 x 50; T 8: 5.5 x 45 left and 5.0 x 45 right.  Offset connectors were affixed to pelvic bolt screw heads. An intra-operative CT spin was performed with visualization of all screws.  All screws appeared to be well-positioned. Osteotomies of L 12, L 23, L 34 levels were performed bilaterally. Bendini rod bending system was utilized and rods were bent, affixed to screw heads and locked down with coronal correction (compression left L 12, L 23, L 34 levels and distraction on the right at the same levels).  5.5 x 330 rod on the left and 5.5 x 335 mm rod on the right.  Autograft was harvested from both ilia for use in fusion and the spinous processes and laminae at L 3 and L 4 levels. along with marrow rich blood. Rods were cut, bent and locked down bilaterally in situ and the posterolateral region was packed with autograft, medium BMP and 100 cc of allograft after decorticating the posterolateral region. The wound was irrigated. A medium Hemovac drain was placed. Long-acting Marcaine was injected  into the paraspinous musculature.  Fascia was closed with 1 Vicryl sutures skin edges were reapproximated 2 and 3-0 Vicryl sutures. The wound was dressed with Dermabond and an occlusive dressing.  the patient was extubated in the operating room and taken to recovery in stable satisfactory condition she tolerated the operation well counts were correct at the end of the case.    PLAN OF CARE: Admit to inpatient   PATIENT DISPOSITION:  PACU - hemodynamically stable.    Delay start of Pharmacological VTE agent (>24hrs) due to surgical blood loss or risk of bleeding: yes

## 2018-01-19 NOTE — Anesthesia Postprocedure Evaluation (Signed)
Anesthesia Post Note  Patient: Jade Price  Procedure(s) Performed: Thoracic Eight to pelvis fixation with Airo (N/A Back) APPLICATION OF INTRAOPERATIVE CT SCAN (N/A )     Patient location during evaluation: PACU Anesthesia Type: General Level of consciousness: awake and alert Pain management: pain level controlled Vital Signs Assessment: post-procedure vital signs reviewed and stable Respiratory status: spontaneous breathing, nonlabored ventilation, respiratory function stable and patient connected to nasal cannula oxygen Cardiovascular status: blood pressure returned to baseline and stable Postop Assessment: no apparent nausea or vomiting Anesthetic complications: no    Last Vitals:  Vitals:   01/19/18 1600 01/19/18 1633  BP: 117/85   Pulse: (!) 103 (!) 102  Resp: 13   Temp:    SpO2: 100% 98%    Last Pain:  Vitals:   01/19/18 1633  TempSrc:   PainSc: 7                  Lelon Ikard P Buford Gayler

## 2018-01-19 NOTE — Anesthesia Procedure Notes (Signed)
Arterial Line Insertion Start/End1/13/2020 7:50 AM, 01/19/2018 8:00 AM Performed by: Rogelia Boga, CRNA, CRNA  Patient location: Pre-op. Preanesthetic checklist: patient identified, IV checked, risks and benefits discussed and pre-op evaluation Left, radial was placed Catheter size: 20 G Hand hygiene performed  and maximum sterile barriers used   Attempts: 1 Procedure performed without using ultrasound guided technique. Following insertion, Biopatch and dressing applied. Post procedure assessment: normal and unchanged  Patient tolerated the procedure well with no immediate complications.

## 2018-01-20 ENCOUNTER — Encounter (HOSPITAL_COMMUNITY): Payer: Self-pay | Admitting: Neurosurgery

## 2018-01-20 MED ORDER — PANTOPRAZOLE SODIUM 40 MG PO TBEC
40.0000 mg | DELAYED_RELEASE_TABLET | Freq: Every day | ORAL | Status: DC
  Administered 2018-01-20 – 2018-01-22 (×3): 40 mg via ORAL
  Filled 2018-01-20 (×3): qty 1

## 2018-01-20 MED ORDER — OXYCODONE HCL 5 MG PO TABS
5.0000 mg | ORAL_TABLET | ORAL | Status: DC | PRN
  Administered 2018-01-20 – 2018-01-23 (×10): 10 mg via ORAL
  Filled 2018-01-20 (×10): qty 2

## 2018-01-20 NOTE — Progress Notes (Signed)
Witnessed waste, approximately 72mL Dilaudid.

## 2018-01-20 NOTE — Social Work (Signed)
CSW acknowledging consult for SNF placement. Will follow for therapy recommendations.   Edith Lord, MSW, LCSWA [facility] [phone]   

## 2018-01-20 NOTE — Progress Notes (Addendum)
Subjective: Patient reports "I just can't get comfortable. I lose the button for my pain medicine"  Objective: Vital signs in last 24 hours: Temp:  [97.9 F (36.6 C)-100.7 F (38.2 C)] 98.7 F (37.1 C) (01/14 0357) Pulse Rate:  [41-113] 105 (01/14 0357) Resp:  [13-21] 21 (01/14 0357) BP: (98-146)/(62-112) 109/62 (01/14 0357) SpO2:  [70 %-100 %] 95 % (01/14 0357) Arterial Line BP: (125-157)/(54-71) 125/54 (01/13 1600)  Intake/Output from previous day: 01/13 0701 - 01/14 0700 In: 3525 [I.V.:3325; IV Piggyback:200] Out: 2415 [Urine:1675; Drains:440; Blood:300] Intake/Output this shift: No intake/output data recorded.  Awake, writhing about in bed. Back pain, right upper thigh pain, as expected. Strength is good BLE. INcisions flat abdomen, right side, and thoracolumbar; without erythema or drainage beneath honeycomb and Dermabond. Hemovac T-L wound patent. Passing gas. Belly soft and nontender. Poor sleep last night d/t back pain, spasm, pump alarm and nasal cannula.   Lab Results: No results for input(s): WBC, HGB, HCT, PLT in the last 72 hours. BMET No results for input(s): NA, K, CL, CO2, GLUCOSE, BUN, CREATININE, CALCIUM in the last 72 hours.  Studies/Results: No results found.  Assessment/Plan:   LOS: 5 days  Per Dr. Venetia Maxon, continue to mobilize in brace, d/c PCA, continue Dilaudid po. Encouraged VAlium for spasm this am; may help more than Robaxin.   Georgiann Cocker 01/20/2018, 7:51 AM   Patient is having quite a bit of postop pain.  Will go back to po pain medication.

## 2018-01-20 NOTE — Progress Notes (Signed)
Wasted in stericycle approximately 47ml of dilaudid PCA (1mg /50ml) with Engineer, water.

## 2018-01-20 NOTE — Evaluation (Addendum)
Occupational Therapy Re-Evaluation Patient Details Name: Jade Price MRN: [id number] DOB: 02-Jun-1950 Today's Date: 01/20/2018    History of Present Illness 68 y.o. female admitted on 01/15/18 for elective L5-S1 anterior lumbar interbody fusion and R anterior lateral lumbar interbody fusion L1-5. 2nd part of fusion on 01/19/2018 per pt. Pt with significant PMH of RLS, lumbar radiculopathy, arthritis, and anxiety.  Pt underwent a second phase of surgery on 01/19/18 T8-pelvis fusion.     Clinical Impression   Pt seated in recliner and willing to participate with OT.  Patient in significant pain, reporting 10/10, and patient pre-medicated before PT session.  Patient s/p new T8-pelvis fusion, and continues to require increased assistance with self care tasks. Pt unable to tolerate further mobility during OT session, but per PT requires min assist for transfers.  She continues to require min assist for UB ADLs, total assist for LB ADLs, and setup for grooming. Noted she is reliant on B UE or supported back when sitting, encouraged her to engage in self care tasks and self feed with improved positioning.  Restless throughout session, cueing required to recall back precautions and short blessed test completed scoring 9/28 (quesitonable impairment) with deficits in short term memory, attention and sequencing. Patient will benefit from continued OT services, and believe she would benefit from CIR in order to focus on maximizing independence, managing pain, and safety prior to dcing home.  Will continue to follow.        Follow Up Recommendations  Supervision/Assistance - 24 hour;CIR    Equipment Recommendations  3 in 1 bedside commode    Recommendations for Other Services Rehab consult     Precautions / Restrictions Precautions Precautions: Fall;Back Precaution Booklet Issued: Yes (comment) Precaution Comments: handout provided previously, reviewed with patinet and required cueing to recall twisting   Required Braces or Orthoses: Spinal Brace Spinal Brace: Thoracolumbosacral orthotic Restrictions Weight Bearing Restrictions: No      Mobility Bed Mobility Overal bed mobility: Needs Assistance Bed Mobility: Rolling;Sidelying to Sit Rolling: Min guard Sidelying to sit: Min guard       General bed mobility comments: OOB in recliner upon entry  Transfers Overall transfer level: Needs assistance Equipment used: Rolling walker (2 wheeled) Transfers: Sit to/from [employer] Sit to Stand: Min assist Stand pivot transfers: Min assist       General transfer comment: deferred due to pain, per PT min assist for transfers    Balance Overall balance assessment: Needs assistance Sitting-balance support: Feet supported;Bilateral upper extremity supported Sitting balance-Leahy Scale: Fair Sitting balance - Comments: reliant on back support or B UE support when seated, limited by pain    Standing balance support: Bilateral upper extremity supported Standing balance-Leahy Scale: Poor Standing balance comment: deferred due to pain                           ADL either performed or assessed with clinical judgement   ADL Overall ADL's : Needs assistance/impaired Eating/Feeding: Set up;Sitting Eating/Feeding Details (indicate cue type and reason): encouragement to self feed, education of positoining Grooming: Set up;Supervision/safety;Sitting   Upper Body Bathing: Set up;Sitting   Lower Body Bathing: Maximal assistance;Sitting/lateral leans Lower Body Bathing Details (indicate cue type and reason): decreased reach to B feet, cannot complete figure 4 technique, pain limiting  Upper Body Dressing : Moderate assistance;Sitting Upper Body Dressing Details (indicate cue type and reason): decreased unsupported sitting balance and tolerance Lower Body Dressing: Total assistance;Sit to/from stand  Toilet Transfer Details (indicate cue type and reason): pt  unable d/t pain, PT reports min assist            General ADL Comments: limited by increased pain, reliant on B UE support or trunk support seated; unable to complete figure 4 technique; encouargement required to engage in self care tasks      Vision Patient Visual Report: No change from baseline Vision Assessment?: No apparent visual deficits     Perception     Praxis      Pertinent Vitals/Pain Pain Assessment: Faces Pain Score: 10-Worst pain ever Faces Pain Scale: Hurts worst Pain Location: back/pelvis and R LE Pain Descriptors / Indicators: Burning;Grimacing;Guarding Pain Intervention(s): Limited activity within patient's tolerance;Monitored during session;Repositioned     Hand Dominance Right   Extremity/Trunk Assessment Upper Extremity Assessment Upper Extremity Assessment: Generalized weakness   Lower Extremity Assessment Lower Extremity Assessment: Defer to PT evaluation RLE Deficits / Details: right leg continues to have numbness and weakness, difficulty WB through the leg with signs of instability in WB.  RLE Sensation: decreased light touch;history of peripheral neuropathy   Cervical / Trunk Assessment Cervical / Trunk Assessment: Other exceptions Cervical / Trunk Exceptions: post op multiple stage fusion   Communication Communication Communication: No difficulties   Cognition Arousal/Alertness: Awake/alert Behavior During Therapy: Impulsive;Restless;Anxious(pain ) Overall Cognitive Status: Impaired/Different from baseline Area of Impairment: Memory;Attention;Orientation                 Orientation Level: Time(reports 5pm) Current Attention Level: Sustained Memory: Decreased short-term memory         General Comments: patient reports increased difficulty recalling since surgery; short blessed test completed: 9/28 (questionable impairment with memory, attention, sequencing)    General Comments       Exercises     Shoulder Instructions       Home Living Family/patient expects to be discharged to:: Private residence Living Arrangements: Spouse/significant other Available Help at Discharge: Family;Available 24 hours/day Type of Home: House Home Access: Stairs to enter [employer] of Steps: 2 Entrance Stairs-Rails: None Home Layout: Multi-level;Able to live on main level with bedroom/bathroom     Bathroom Shower/Tub: Walk-in shower;Door   [employer] Toilet: Standard     Home Equipment: Shower seat - built in;Walker - 2 wheels;Hand held shower head          Prior Functioning/Environment Level of Independence: Independent        Comments: drives, retired, husband reports she has been refusing to use RW, but likely needed to PTA.  No h/o falls. Husband is a primary care/OBGYN doc        OT Problem List: Decreased strength;Decreased range of motion;Decreased activity tolerance;Impaired balance (sitting and/or standing);Pain;Decreased knowledge of precautions;Decreased knowledge of use of DME or AE;Decreased safety awareness;Decreased cognition      OT Treatment/Interventions: Self-care/ADL training;Balance training;DME and/or AE instruction;Patient/family education    OT Goals(Current goals can be found in the care plan section) Acute Rehab OT Goals Patient Stated Goal: to go home and improve right leg pain OT Goal Formulation: With patient Time For Goal Achievement: 02/03/18 Potential to Achieve Goals: Good  OT Frequency: Min 2X/week   Barriers to D/C:            Co-evaluation              AM-PAC OT "6 Clicks" Daily Activity     Outcome Measure Help from another person eating meals?: None Help from another person taking care of personal grooming?:  A Little Help from another person toileting, which includes using toliet, bedpan, or urinal?: Total Help from another person bathing (including washing, rinsing, drying)?: A Lot Help from another person to put on and taking off regular upper  body clothing?: A Little Help from another person to put on and taking off regular lower body clothing?: Total 6 Click Score: 14   End of Session Equipment Utilized During Treatment: Back brace;Oxygen Nurse Communication: Precautions;Mobility status;Other (comment)(pain)  Activity Tolerance: Patient limited by pain Patient left: in chair;with call bell/phone within reach;with nursing/sitter in room;with family/visitor present  OT Visit Diagnosis: Unsteadiness on feet (R26.81);Other abnormalities of gait and mobility (R26.89);Muscle weakness (generalized) (M62.81);Pain Pain - Right/Left: Right Pain - part of body: Leg;Hip(back incision and R LE )                Time: 1211-1229 OT Time Calculation (min): 18 min Charges:  OT General Charges $OT Visit: 1 Visit OT Evaluation $OT Re-eval: 1 Re-eval  Chancy Milroyhristie S Oluwatimileyin Vivier, OT Acute Rehabilitation Services Pager [phone] Office [phone]   Chancy MilroyChristie S Sharlee Rufino 01/20/2018, 1:32 PM

## 2018-01-20 NOTE — Progress Notes (Signed)
Rehab Admissions Coordinator Note:  Patient was screened by Clois Dupes for appropriateness for an Inpatient Acute Rehab Consult per PT recs. .  At this time, we are recommending Inpatient Rehab consult.  Clois Dupes RN MSN 01/20/2018, 1:02 PM  I can be reached at [phone].

## 2018-01-20 NOTE — Evaluation (Addendum)
Physical Therapy Re-Evaluation Patient Details Name: Jade Price MRN: [id number] DOB: Apr 09, 1950 Today's Date: 01/20/2018   History of Present Illness  68 y.o. female admitted on 01/15/18 for elective L5-S1 anterior lumbar interbody fusion and R anterior lateral lumbar interbody fusion L1-5. 2nd part of fusion on 01/19/2018 per pt. Pt with significant PMH of RLS, lumbar radiculopathy, arthritis, and anxiety.  Pt underwent a second phase of surgery on 01/19/18 T8-pelvis fusion.    Clinical Impression  Pt s/p new T8-pelvis fusion yesterday and this is her first time OOB.  She continues to require min assist overall and needs care for every aspect of her mobility, donning her brace, transfer, and peri care.  I encouraged her husband to let her do what she can on her own (he was feeding her when I entered the room).  She continues to endorse significant R LE pain and weakness in WB and was limited to bed->BSC->chair today.  She is very limited in her mobility and I think she would benefit from multidisciplinary therapy and pain management from the inpatient rehab setting before returning home.  I have asked for them to screen her for appropriateness.   PT to follow acutely for deficits listed below.    Follow Up Recommendations Supervision for mobility/OOB;CIR    Equipment Recommendations  Rolling walker with 5" wheels    Recommendations for Other Services   NA    Precautions / Restrictions Precautions Precautions: Fall;Back Precaution Booklet Issued: Yes (comment) Precaution Comments: given during previous sessions.  We did review log roll technique Required Braces or Orthoses: Spinal Brace Spinal Brace: Thoracolumbosacral orthotic;Applied in sitting position      Mobility  Bed Mobility Overal bed mobility: Needs Assistance Bed Mobility: Rolling;Sidelying to Sit Rolling: Min guard Sidelying to sit: Min guard       General bed mobility comments: Min guard assist for safety as pt is moving  very fast (likely to push through the pain).    Transfers Overall transfer level: Needs assistance Equipment used: Rolling walker (2 wheeled) Transfers: Sit to/from [employer] Sit to Stand: Min assist Stand pivot transfers: Min assist       General transfer comment: Min assist to stand to RW, min assist to pivot to the [facility] and then stand and take pivotal steps to the chair with RW, R LE weakness and instability noted during standing and stepping.   Ambulation/Gait Ambulation/Gait assistance: Min assist Gait Distance (Feet): 3 Feet Assistive device: Rolling walker (2 wheeled) Gait Pattern/deviations: Step-to pattern;Antalgic     General Gait Details: Pt with painful WB through her right leg, Knee buckling noted in stance phase.           Balance Overall balance assessment: Needs assistance Sitting-balance support: Feet supported;Bilateral upper extremity supported Sitting balance-Leahy Scale: Fair Sitting balance - Comments: unable to release hands long in sitting as she seems to be unweighting her back.  She attempted to help donn her brace EOB, but needed significant help.    Standing balance support: Bilateral upper extremity supported Standing balance-Leahy Scale: Poor Standing balance comment: Needs external support from RW and therapist.                             Pertinent Vitals/Pain Pain Assessment: Faces Faces Pain Scale: Hurts whole lot Pain Location: back/pelvis and R LE Pain Descriptors / Indicators: Burning;Grimacing;Guarding Pain Intervention(s): Limited activity within patient's tolerance;Monitored during session;Repositioned;Premedicated before session    Home  Living Family/patient expects to be discharged to:: Private residence Living Arrangements: Spouse/significant other Available Help at Discharge: Family;Available 24 hours/day(husband uses a cane and is a bit unsteady on his feet) Type of Home: House Home Access:  Stairs to enter Entrance Stairs-Rails: None Entrance Stairs-Number of Steps: 2 Home Layout: Multi-level;Able to live on main level with bedroom/bathroom Home Equipment: Shower seat - built in;Walker - 2 wheels;Hand held shower head(has been borrowing RW, wants one of her own.  )      Prior Function Level of Independence: Independent         Comments: drives, retired, husband reports she has been refusing to use RW, but likely needed to PTA.  No h/o falls. Husband is a primary care/OBGYN doc     Hand Dominance   Dominant Hand: Right    Extremity/Trunk Assessment   Upper Extremity Assessment Upper Extremity Assessment: Defer to OT evaluation    Lower Extremity Assessment Lower Extremity Assessment: RLE deficits/detail RLE Deficits / Details: right leg continues to have numbness and weakness, difficulty WB through the leg with signs of instability in WB.  RLE Sensation: decreased light touch;history of peripheral neuropathy    Cervical / Trunk Assessment Cervical / Trunk Assessment: Other exceptions Cervical / Trunk Exceptions: post op multiple stage fusion  Communication   Communication: No difficulties  Cognition Arousal/Alertness: Awake/alert Behavior During Therapy: WFL for tasks assessed/performed;Impulsive(Pt is mildly impulsive, moving quickly before PT ready) Overall Cognitive Status: Within Functional Limits for tasks assessed                                               Assessment/Plan    PT Assessment Patient needs continued PT services  PT Problem List Decreased strength;Decreased activity tolerance;Decreased balance;Decreased mobility;Decreased knowledge of use of DME;Decreased knowledge of precautions;Impaired sensation;Pain       PT Treatment Interventions DME instruction;Gait training;Stair training;Therapeutic activities;Functional mobility training;Therapeutic exercise;Balance training;Patient/family education;Modalities    PT  Goals (Current goals can be found in the Care Plan section)  Acute Rehab PT Goals Patient Stated Goal: to go home and improve right leg pain PT Goal Formulation: With patient/family Time For Goal Achievement: 01/27/18 Potential to Achieve Goals: Fair    Frequency Min 5X/week   Barriers to discharge Inaccessible home environment;Decreased caregiver support Pt lives in a tri level home with her husband who uses a cane       AM-PAC PT "6 Clicks" Mobility  Outcome Measure Help needed turning from your back to your side while in a flat bed without using bedrails?: A Little Help needed moving from lying on your back to sitting on the side of a flat bed without using bedrails?: A Little Help needed moving to and from a bed to a chair (including a wheelchair)?: A Little Help needed standing up from a chair using your arms (e.g., wheelchair or bedside chair)?: A Little Help needed to walk in hospital room?: A Little Help needed climbing 3-5 steps with a railing? : A Lot 6 Click Score: 17    End of Session Equipment Utilized During Treatment: Back brace Activity Tolerance: Patient limited by pain Patient left: in chair;with call bell/phone within reach;with family/visitor present Nurse Communication: Other (comment)(pt urinated, but not much) PT Visit Diagnosis: Muscle weakness (generalized) (M62.81);Difficulty in walking, not elsewhere classified (R26.2);Other symptoms and signs involving the nervous system (R29.898);Pain Pain - Right/Left:  Right Pain - part of body: Leg    Time: 9311-2162 PT Time Calculation (min) (ACUTE ONLY): 22 min   Charges:         Lurena Joiner B. Undrea Archbold, PT, DPT  Acute Rehabilitation [phone] pager #[phone] office   PT Evaluation $PT Re-evaluation: 1 Re-eval         01/20/2018, 12:36 PM

## 2018-01-20 NOTE — Consult Note (Signed)
Physical Medicine and Rehabilitation Consult Reason for Consult:  Decreased functional mobility Referring Physician:  Dr. Venetia MaxonStern   HPI: Jade Price is a 68 y.o.right handed female with history of hyperlipidemia, chronic back pain. Per chart review patient lives with spouse. Independent prior to admission. Multilevel home with bedroom on main level. Spouse uses a cane.Presented 01/15/2018 with low back pain radiating to the lower extremities. X-rays and imaging revealed spinal stenosis and scoliosis with foraminal stenosis of lateral listhesis L3-4 stenosis with radiculopathy. Underwent L5-S1 anterior lumbar interbody fusion, L1-2, 2-3, 3-4, 4-5 anterior lateral lumbar interbody fusion 01/15/2018 per Dr. Venetia MaxonStern as well as second part of fusion 01/19/2018. Hospital course pain management. TLSO back brace and out of bed applied in sitting position. Therapy evaluations completed with recommendations of physical medicine rehabilitation consult.   Review of Systems  Constitutional: Negative for chills and fever.  HENT: Negative for hearing loss.   Eyes: Negative for blurred vision and double vision.  Respiratory: Negative for cough and shortness of breath.   Cardiovascular: Negative for chest pain, palpitations and leg swelling.  Gastrointestinal: Positive for constipation. Negative for nausea.       GERD  Genitourinary: Negative for dysuria, flank pain and hematuria.  Musculoskeletal: Positive for back pain and myalgias.  Skin: Negative for rash.  Neurological: Positive for sensory change.  Psychiatric/Behavioral:       Anxiety  All other systems reviewed and are negative.  Past Medical History:  Diagnosis Date  . Anxiety    situitional  . Arthritis   . Back pain 02/2017  . Complication of anesthesia   . Degenerative lumbar spinal stenosis   . Disc displacement, lumbar   . GERD (gastroesophageal reflux disease)   . High cholesterol   . Hip pain 02/2017  . Leg pain    While  walking  . Lumbar radiculopathy   . Osteopenia 10/01/2017  . PONV (postoperative nausea and vomiting)   . RLS (restless legs syndrome)   . Sciatic notch pain, right   . Scoliosis   . Spondylolisthesis    Lumber region  . Staph infection 2012   Finger   Past Surgical History:  Procedure Laterality Date  . COLONOSCOPY    . FINGER SURGERY Right    infection   Family History  Problem Relation Age of Onset  . Cancer Mother   . Alzheimer's disease Mother    Social History:  reports that she has never smoked. She has never used smokeless tobacco. She reports previous alcohol use. She reports that she does not use drugs. Allergies:  Allergies  Allergen Reactions  . Gabapentin Other (See Comments)    Depression within 2 days   Medications Prior to Admission  Medication Sig Dispense Refill  . bismuth subsalicylate (PEPTO BISMOL) 262 MG chewable tablet Chew 524 mg by mouth as needed for indigestion or diarrhea or loose stools.    Marland Kitchen. HYDROcodone-acetaminophen (NORCO/VICODIN) 5-325 MG tablet Take 1 tablet by mouth every 6 (six) hours as needed for moderate pain.    Marland Kitchen. omeprazole (PRILOSEC OTC) 20 MG tablet Take 40 mg by mouth daily.    Marland Kitchen. Phenyleph-Doxylamine-DM-APAP (NYQUIL SEVERE COLD/FLU) 5-6.25-10-325 MG/15ML LIQD Take by mouth.    Marland Kitchen. rOPINIRole (REQUIP) 4 MG tablet Take 4 mg by mouth at bedtime.     . naproxen sodium (ALEVE) 220 MG tablet Take 660 mg by mouth 3 (three) times daily as needed (for pain or headache).       Home: Home  Living Family/patient expects to be discharged to:: Private residence Living Arrangements: Spouse/significant other Available Help at Discharge: Family, Available 24 hours/day Type of Home: House Home Access: Stairs to enter Entergy CorporationEntrance Stairs-Number of Steps: 2 Entrance Stairs-Rails: None Home Layout: Multi-level, Able to live on main level with bedroom/bathroom Bathroom Shower/Tub: Psychologist, counsellingWalk-in shower, Door Foot LockerBathroom Toilet: Standard Home Equipment: Psychiatric nursehower  seat - built in, Environmental consultantWalker - 2 wheels, Hand held shower head  Functional History: Prior Function Level of Independence: Independent Comments: drives, retired, husband reports she has been refusing to use RW, but likely needed to PTA.  No h/o falls. Husband is a primary care/OBGYN doc Functional Status:  Mobility: Bed Mobility Overal bed mobility: Needs Assistance Bed Mobility: Rolling, Sidelying to Sit Rolling: Min guard Sidelying to sit: Min guard Sit to sidelying: Min assist General bed mobility comments: OOB in recliner upon entry Transfers Overall transfer level: Needs assistance Equipment used: Rolling walker (2 wheeled) Transfers: Sit to/from Stand, Anadarko Petroleum CorporationStand Pivot Transfers Sit to Stand: Min assist Stand pivot transfers: Min assist General transfer comment: deferred due to pain, per PT min assist for transfers Ambulation/Gait Ambulation/Gait assistance: Min assist Gait Distance (Feet): 3 Feet Assistive device: Rolling walker (2 wheeled) Gait Pattern/deviations: Step-to pattern, Antalgic General Gait Details: Pt with painful WB through her right leg, Knee buckling noted in stance phase.   Gait velocity: decreased    ADL: ADL Overall ADL's : Needs assistance/impaired Eating/Feeding: Set up, Sitting Eating/Feeding Details (indicate cue type and reason): encouragement to self feed, education of positoining Grooming: Set up, Supervision/safety, Sitting Upper Body Bathing: Set up, Sitting Lower Body Bathing: Maximal assistance, Sitting/lateral leans Lower Body Bathing Details (indicate cue type and reason): decreased reach to B feet, cannot complete figure 4 technique, pain limiting  Upper Body Dressing : Moderate assistance, Sitting Upper Body Dressing Details (indicate cue type and reason): decreased unsupported sitting balance and tolerance Lower Body Dressing: Total assistance, Sit to/from stand Lower Body Dressing Details (indicate cue type and reason): min A sit<>stand;  cannot cross her legs to get to her feet Toilet Transfer: Minimal assistance, RW Toilet Transfer Details (indicate cue type and reason): pt unable d/t pain, PT reports min assist  Toileting- Clothing Manipulation and Hygiene: Moderate assistance Toileting - Clothing Manipulation Details (indicate cue type and reason): min A sit<>stand General ADL Comments: limited by increased pain, reliant on B UE support or trunk support seated; unable to complete figure 4 technique; encouargement required to engage in self care tasks   Cognition: Cognition Overall Cognitive Status: Impaired/Different from baseline Orientation Level: Oriented X4 Cognition Arousal/Alertness: Awake/alert Behavior During Therapy: Impulsive, Restless, Anxious(pain ) Overall Cognitive Status: Impaired/Different from baseline Area of Impairment: Memory, Attention, Orientation Orientation Level: Time(reports 5pm) Current Attention Level: Sustained Memory: Decreased short-term memory General Comments: patient reports increased difficulty recalling since surgery; short blessed test completed: 9/28 (questionable impairment with memory, attention, sequencing)   Blood pressure 128/83, pulse (!) 107, temperature 99.4 F (37.4 C), temperature source Axillary, resp. rate 16, height 5\' 5"  (1.651 m), weight 65.8 kg, SpO2 100 %. Physical Exam  Constitutional: She is oriented to person, place, and time. She appears well-developed.  Lips are pale, conjunctiva pale  HENT:  Head: Normocephalic and atraumatic.  Eyes: Pupils are equal, round, and reactive to light. EOM are normal. No scleral icterus.  Neck: No JVD present.  Cardiovascular: Normal rate and regular rhythm.  No murmur heard. Respiratory: Effort normal and breath sounds normal. No stridor. No respiratory distress. She has no wheezes.  GI: Soft. Bowel sounds are normal. She exhibits no distension. There is no abdominal tenderness.  Musculoskeletal:        General: Tenderness  present. No edema.  Neurological: She is alert and oriented to person, place, and time.  Patient is alert and very anxious. She does provide her name and age. Exam limited by patient's pain and tolerance.  Motor strength is 4+ bilateral deltoid bicep tricep grip 4- bilateral hip flexor knee extensor ankle dorsiflexor Sensation intact light touch bilateral upper lower limb   Skin: Skin is warm and dry. She is not diaphoretic.  Psychiatric: She has a normal mood and affect.    No results found for this or any previous visit (from the past 24 hour(s)). No results found.   Assessment/Plan: Diagnosis: Lumbar and thoracic spine spinal stenosis is post L5-S1 anterior longitudinal interbody fusion as well as L1-L5 XLIF 01/15/2018, T8 through ilium decompression posterior spinal fusion 01/19/2018 1. Does the need for close, 24 hr/day medical supervision in concert with the patient's rehab needs make it unreasonable for this patient to be served in a less intensive setting? Yes 2. Co-Morbidities requiring supervision/potential complications: Low-grade fever, acute on chronic pain 3. Due to bladder management, bowel management, safety, skin/wound care, disease management, medication administration, pain management and patient education, does the patient require 24 hr/day rehab nursing? Yes 4. Does the patient require coordinated care of a physician, rehab nurse, PT (1-2 hrs/day, 5 days/week) and OT (1-2 hrs/day, 5 days/week) to address physical and functional deficits in the context of the above medical diagnosis(es)? Yes Addressing deficits in the following areas: balance, endurance, locomotion, strength, transferring, bowel/bladder control, bathing, dressing, feeding, grooming, toileting, cognition and psychosocial support 5. Can the patient actively participate in an intensive therapy program of at least 3 hrs of therapy per day at least 5 days per week? Yes 6. The potential for patient to make  measurable gains while on inpatient rehab is good 7. Anticipated functional outcomes upon discharge from inpatient rehab are modified independent  with PT, supervision and min assist with OT, n/a with SLP. 8. Estimated rehab length of stay to reach the above functional goals is: 7 to 10 days 9. Anticipated D/C setting: Home 10. Anticipated post D/C treatments: HH therapy 11. Overall Rehab/Functional Prognosis: excellent  RECOMMENDATIONS: This patient's condition is appropriate for continued rehabilitative care in the following setting: CIR Patient has agreed to participate in recommended program. Potentially Note that insurance prior authorization may be required for reimbursement for recommended care.  Comment: Patient not ready yet, needs additional work-up of low-grade temps, needs to be able to tolerate therapy, keep patient up in chair 3 hours/day may be 90 minutes twice a day "I have personally performed a face to face diagnostic evaluation of this patient.  Additionally, I have reviewed and concur with the physician assistant's documentation above."  Erick Colace M.D. [facility] FAAPM&R (Sports Med, Neuromuscular Med) Diplomate Am Board of Electrodiagnostic Med  Lynnae Prude 01/20/2018

## 2018-01-21 ENCOUNTER — Inpatient Hospital Stay (HOSPITAL_COMMUNITY): Payer: Medicare Other

## 2018-01-21 ENCOUNTER — Encounter (HOSPITAL_COMMUNITY): Payer: Self-pay | Admitting: Neurosurgery

## 2018-01-21 LAB — URINALYSIS, ROUTINE W REFLEX MICROSCOPIC
Bilirubin Urine: NEGATIVE
Glucose, UA: NEGATIVE mg/dL
Hgb urine dipstick: NEGATIVE
Ketones, ur: NEGATIVE mg/dL
Leukocytes, UA: NEGATIVE
Nitrite: NEGATIVE
Protein, ur: NEGATIVE mg/dL
Specific Gravity, Urine: 1.017 (ref 1.005–1.030)
pH: 6 (ref 5.0–8.0)

## 2018-01-21 LAB — CBC
HCT: 17 % — ABNORMAL LOW (ref 36.0–46.0)
Hemoglobin: 5.5 g/dL — CL (ref 12.0–15.0)
MCH: 29.7 pg (ref 26.0–34.0)
MCHC: 32.4 g/dL (ref 30.0–36.0)
MCV: 91.9 fL (ref 80.0–100.0)
Platelets: 309 10*3/uL (ref 150–400)
RBC: 1.85 MIL/uL — ABNORMAL LOW (ref 3.87–5.11)
RDW: 13.3 % (ref 11.5–15.5)
WBC: 16.6 10*3/uL — ABNORMAL HIGH (ref 4.0–10.5)
nRBC: 0.1 % (ref 0.0–0.2)

## 2018-01-21 LAB — PREPARE RBC (CROSSMATCH)

## 2018-01-21 MED ORDER — PROMETHAZINE HCL 25 MG/ML IJ SOLN
12.5000 mg | Freq: Four times a day (QID) | INTRAMUSCULAR | Status: DC | PRN
  Administered 2018-01-21: 12.5 mg via INTRAVENOUS
  Filled 2018-01-21: qty 1

## 2018-01-21 MED ORDER — HYDROMORPHONE HCL 2 MG PO TABS
4.0000 mg | ORAL_TABLET | ORAL | Status: DC | PRN
  Administered 2018-01-21 – 2018-01-23 (×9): 4 mg via ORAL
  Filled 2018-01-21 (×9): qty 2

## 2018-01-21 MED ORDER — SODIUM CHLORIDE 0.9% IV SOLUTION
Freq: Once | INTRAVENOUS | Status: AC
  Administered 2018-01-21: 17:00:00 via INTRAVENOUS

## 2018-01-21 NOTE — Progress Notes (Signed)
Patient complained of pain first thing this morning, 10/10 on pain scale.   Husband at bedside states that wife "cannot have valium because I need her to be with it", robaxin given and valium d/c from [facility].   Husband also requested to speak with CIR coordinator before noon as he has to leave to go back home.   Nurse called CIR @ [phone] and relayed message, Britta Mccreedy called back and will visit patient and husband this morning.   Patient was given alternative forms of pain relied by Nurse as her blood pressures are soft and if continue on trend pain medication will have to be held.   Patient encouraged to ambulate through the pain.

## 2018-01-21 NOTE — Progress Notes (Signed)
Inpatient Rehabilitation Admissions Coordinator  I met with patient and her spouse at bedside. To discus goals and expectations of an inpt rehab admit. Spouse questioned possible SNF at [facility] near [place] at first, but then reconsidered and states preference for CIR admit rather than SNF. Noted [facility] in past 48 hrs. Discussed with Dr., Letta Pate that felt not medically ready to admit today. I have notified Verdis Prime and will follow up tomorrow.  Danne Baxter, RN, MSN Rehab Admissions Coordinator [phone] 01/21/2018 12:09 PM

## 2018-01-21 NOTE — Care Management Important Message (Signed)
Important Message  Patient Details  Name: Jade Price MRN: [id number] Date of Birth: February 16, 1950   Medicare Important Message Given:  Yes    Dorena Bodo 01/21/2018, 2:26 PM

## 2018-01-21 NOTE — Progress Notes (Signed)
Physical Therapy Treatment Patient Details Name: Jade Price MRN: [id number] DOB: 11-Oct-1950 Today's Date: 01/21/2018    History of Present Illness 68 y.o. female admitted on 01/15/18 for elective L5-S1 anterior lumbar interbody fusion and R anterior lateral lumbar interbody fusion L1-5. 2nd part of fusion on 01/19/2018 per pt. Pt with significant PMH of RLS, lumbar radiculopathy, arthritis, and anxiety.  Pt underwent a second phase of surgery on 01/19/18 T8-pelvis fusion.      PT Comments    Pt is showing improvement over yesterday in gait distance and stability, but still reports significant pain.  Emphasized education, transitions side to sit and progressing gait.  Follow Up Recommendations  Supervision for mobility/OOB;CIR     Equipment Recommendations  Rolling walker with 5" wheels    Recommendations for Other Services       Precautions / Restrictions Precautions Precautions: Fall;Back Precaution Booklet Issued: Yes (comment) Precaution Comments: handout provided previously, reviewed with patinet and required cueing to recall twisting  Required Braces or Orthoses: Spinal Brace Spinal Brace: Thoracolumbosacral orthotic Restrictions Weight Bearing Restrictions: No    Mobility  Bed Mobility Overal bed mobility: Needs Assistance Bed Mobility: Sidelying to Sit;Sit to Sidelying   Sidelying to sit: Min assist     Sit to sidelying: Min assist General bed mobility comments: Reinforced technique and assist truncal coming to EOB and legs getting into bed.  Transfers Overall transfer level: Needs assistance Equipment used: Rolling walker (2 wheeled) Transfers: Sit to/from Stand Sit to Stand: Min assist         General transfer comment: requires cueing for hand placement, min assist to power up into standing and stability   Ambulation/Gait Ambulation/Gait assistance: Min assist Gait Distance (Feet): 90 Feet(x2)  Assistive device: Rolling walker (2 wheeled) Gait  Pattern/deviations: Step-to pattern;Step-through pattern Gait velocity: decreased   General Gait Details: continuously antalgic gait on the right with moderate use fo the RW.  pt started sagging in her knees and posturally on the return.   Stairs             Wheelchair Mobility    Modified Rankin (Stroke Patients Only)       Balance Overall balance assessment: Needs assistance Sitting-balance support: Feet supported;Bilateral upper extremity supported Sitting balance-Leahy Scale: Fair Sitting balance - Comments: able to donn brace without assisting balance donning the brace.   Standing balance support: Bilateral upper extremity supported Standing balance-Leahy Scale: Poor Standing balance comment: reliant on UE and external support                            Cognition Arousal/Alertness: Awake/alert Behavior During Therapy: Flat affect;WFL for tasks assessed/performed Overall Cognitive Status: Within Functional Limits for tasks assessed Area of Impairment: Memory;Attention                   Current Attention Level: Sustained Memory: Decreased short-term memory         General Comments: NT formally.  pt follow direction well      Exercises      General Comments General comments (skin integrity, edema, etc.): Reinforced basic back education and expected progression of activity.      Pertinent Vitals/Pain Pain Assessment: 0-10 Pain Score: 7  Pain Location: back/pelvis and R LE Pain Descriptors / Indicators: Burning;Grimacing;Guarding Pain Intervention(s): Monitored during session;Repositioned    Home Living  Prior Function            PT Goals (current goals can now be found in the care plan section) Acute Rehab PT Goals Patient Stated Goal: to go home and improve right leg pain PT Goal Formulation: With patient/family Time For Goal Achievement: 01/27/18 Potential to Achieve Goals: Fair Progress  towards PT goals: Progressing toward goals    Frequency    Min 5X/week      PT Plan Current plan remains appropriate    Co-evaluation              AM-PAC PT "6 Clicks" Mobility   Outcome Measure  Help needed turning from your back to your side while in a flat bed without using bedrails?: A Little Help needed moving from lying on your back to sitting on the side of a flat bed without using bedrails?: A Little Help needed moving to and from a bed to a chair (including a wheelchair)?: A Little Help needed standing up from a chair using your arms (e.g., wheelchair or bedside chair)?: A Little Help needed to walk in hospital room?: A Little Help needed climbing 3-5 steps with a railing? : A Little 6 Click Score: 18    End of Session Equipment Utilized During Treatment: Back brace Activity Tolerance: Patient limited by pain;Patient tolerated treatment well Patient left: in bed;with call bell/phone within reach Nurse Communication: Mobility status PT Visit Diagnosis: Muscle weakness (generalized) (M62.81);Difficulty in walking, not elsewhere classified (R26.2);Other symptoms and signs involving the nervous system (R29.898);Pain Pain - Right/Left: Right Pain - part of body: Leg     Time: 1347-1410 PT Time Calculation (min) (ACUTE ONLY): 23 min  Charges:  $Gait Training: 8-22 mins $Therapeutic Activity: 8-22 mins                     01/21/2018  [place] BingKen Arshdeep Bolger, PT Acute Rehabilitation Services [phone]  (pager) [phone]  (office)   Jade Price 01/21/2018, 3:47 PM

## 2018-01-21 NOTE — Progress Notes (Signed)
Nurse went to get patient vitals after 15 minutes and noticed blood on the sheet. IV access site was leaking. Freeman Caldron, AD attempted the first time and Nurse Afifa Truax attempted the 2nd before STAT IV consult was put in.   IV team was successful and blood was restarted.  Report given to [facility], Charity fundraiser.

## 2018-01-21 NOTE — Progress Notes (Signed)
Occupational Therapy Treatment Patient Details Name: Jade Price MRN: [id number] DOB: 09-29-1950 Today's Date: 01/21/2018    History of present illness 68 y.o. female admitted on 01/15/18 for elective L5-S1 anterior lumbar interbody fusion and R anterior lateral lumbar interbody fusion L1-5. 2nd part of fusion on 01/19/2018 per pt. Pt with significant PMH of RLS, lumbar radiculopathy, arthritis, and anxiety.  Pt underwent a second phase of surgery on 01/19/18 T8-pelvis fusion.     OT comments  Patient pleasant and cooperative, willing to work with OT but continues to reports maximal pain and nausea throughout session.  BP monitored see below.  Patient able to complete bed mobility and transfers with min assist today, cueing for hand placement, safety and precautions.  Patient able to recall 3/3 spinal precautions, cueing to adhere to during self care.  Toileting with mod assist and initiated LB AE training with good recall of sock aide use.  Will follow, CIR remains appropriate.   BP: supine 106/71, EOB 96/76, EOB x 5 minutes 108/98, after mobility/toileting sitting in chair 88/47, reclined in chair 118/55--RN present and aware    Follow Up Recommendations  Supervision/Assistance - 24 hour;CIR    Equipment Recommendations  3 in 1 bedside commode    Recommendations for Other Services Rehab consult    Precautions / Restrictions Precautions Precautions: Fall;Back Precaution Booklet Issued: Yes (comment) Precaution Comments: handout provided previously, reviewed with patinet and required cueing to recall twisting  Required Braces or Orthoses: Spinal Brace Spinal Brace: Thoracolumbosacral orthotic Restrictions Weight Bearing Restrictions: No       Mobility Bed Mobility Overal bed mobility: Needs Assistance Bed Mobility: Sidelying to Sit   Sidelying to sit: Min assist       General bed mobility comments: laying on L side upon entry, required min assist to ascend trunk to sit EOB    Transfers Overall transfer level: Needs assistance Equipment used: Rolling walker (2 wheeled) Transfers: Sit to/from Stand Sit to Stand: Min assist         General transfer comment: requires cueing for hand placement, min assist to power up into standing and stability     Balance Overall balance assessment: Needs assistance Sitting-balance support: Feet supported;Bilateral upper extremity supported Sitting balance-Leahy Scale: Fair Sitting balance - Comments: limited unsupported sitting balance without UE support    Standing balance support: Bilateral upper extremity supported Standing balance-Leahy Scale: Poor Standing balance comment: reliant on UE and external support                           ADL either performed or assessed with clinical judgement   ADL Overall ADL's : Needs assistance/impaired                       Lower Body Dressing Details (indicate cue type and reason): min assist sit<>stand, initated AE education with sock aide and able to don socks with min assist  Toilet Transfer: Minimal assistance;RW;BSC   Toileting- Clothing Manipulation and Hygiene: Moderate assistance;Sit to/from stand;Cueing for back precautions;Cueing for compensatory techniques Toileting - Clothing Manipulation Details (indicate cue type and reason): mod assist for hygiene and managing clothing      Functional mobility during ADLs: Minimal assistance;Rolling walker General ADL Comments: continues to be limited by pain, nausea today     Vision   Vision Assessment?: No apparent visual deficits   Perception     Praxis      Cognition Arousal/Alertness: Awake/alert Behavior  During Therapy: Flat affect Overall Cognitive Status: Impaired/Different from baseline Area of Impairment: Memory;Attention                   Current Attention Level: Sustained Memory: Decreased short-term memory                  Exercises     Shoulder Instructions        General Comments nasueated throughout session; BP monitored     Pertinent Vitals/ Pain       Pain Assessment: 0-10 Pain Score: 10-Worst pain ever Pain Location: back/pelvis and R LE Pain Descriptors / Indicators: Burning;Grimacing;Guarding  Home Living                                          Prior Functioning/Environment              Frequency  Min 2X/week        Progress Toward Goals  OT Goals(current goals can now be found in the care plan section)  Progress towards OT goals: Progressing toward goals  Acute Rehab OT Goals Patient Stated Goal: to go home and improve right leg pain OT Goal Formulation: With patient Time For Goal Achievement: 02/03/18 Potential to Achieve Goals: Good  Plan Discharge plan remains appropriate;Frequency remains appropriate    Co-evaluation                 AM-PAC OT "6 Clicks" Daily Activity     Outcome Measure   Help from another person eating meals?: None Help from another person taking care of personal grooming?: A Little Help from another person toileting, which includes using toliet, bedpan, or urinal?: Total Help from another person bathing (including washing, rinsing, drying)?: A Lot Help from another person to put on and taking off regular upper body clothing?: A Little Help from another person to put on and taking off regular lower body clothing?: A Lot 6 Click Score: 15    End of Session Equipment Utilized During Treatment: Back brace  OT Visit Diagnosis: Unsteadiness on feet (R26.81);Other abnormalities of gait and mobility (R26.89);Muscle weakness (generalized) (M62.81);Pain Pain - Right/Left: Right Pain - part of body: Leg;Hip   Activity Tolerance Patient limited by pain   Patient Left in chair;with call bell/phone within reach;with family/visitor present   Nurse Communication Precautions;Mobility status        Time: [id number] OT Time Calculation (min): 36 min  Charges: OT  General Charges $OT Visit: 1 Visit OT Treatments $Self Care/Home Management : 23-37 mins  Chancy Milroyhristie S Naziah Weckerly, OT Acute Rehabilitation Services Pager [phone] Office [phone]     Chancy MilroyChristie S Ande Therrell 01/21/2018, 2:01 PM

## 2018-01-21 NOTE — Progress Notes (Signed)
Subjective: Patient reports still quite painful in back with limited mobility  Objective: Vital signs in last 24 hours: Temp:  [98.8 F (37.1 C)-101.3 F (38.5 C)] 99.4 F (37.4 C) (01/15 0500) Pulse Rate:  [102-120] 103 (01/14 2348) Resp:  [16-17] 16 (01/14 1116) BP: (98-136)/(50-100) 98/50 (01/15 0500) SpO2:  [88 %-100 %] 88 % (01/14 2348)  Intake/Output from previous day: 01/14 0701 - 01/15 0700 In: 1605.5 [I.V.:1555.5; IV Piggyback:50] Out: 180 [Drains:180] Intake/Output this shift: No intake/output data recorded.  Physical Exam: Leg strength full.  No numbness.  Dressings CDI.  No swelling.  Lab Results: No results for input(s): WBC, HGB, HCT, PLT in the last 72 hours. BMET No results for input(s): NA, K, CL, CO2, GLUCOSE, BUN, CREATININE, CALCIUM in the last 72 hours.  Studies/Results: No results found.  Assessment/Plan: Mobilizing slowly.  Continue drain today.  Agree with Rehab consult and I have requested this.  Increased dilaudid prn to better manage postop pain.    LOS: 6 days    Dorian Heckle, MD 01/21/2018, 7:43 AM

## 2018-01-21 NOTE — Progress Notes (Signed)
CRITICAL VALUE ALERT  Critical Value:  Hemoglobin = 5.5  Date & Time Notied:  01/21/2018 @ 1503  Provider Notified: Georgiann Cocker, RN   Orders Received/Actions taken: Transfuse blood

## 2018-01-22 LAB — CBC WITH DIFFERENTIAL/PLATELET
Abs Immature Granulocytes: 0.07 10*3/uL (ref 0.00–0.07)
Basophils Absolute: 0 10*3/uL (ref 0.0–0.1)
Basophils Relative: 0 %
Eosinophils Absolute: 0.2 10*3/uL (ref 0.0–0.5)
Eosinophils Relative: 2 %
HCT: 24.2 % — ABNORMAL LOW (ref 36.0–46.0)
Hemoglobin: 7.8 g/dL — ABNORMAL LOW (ref 12.0–15.0)
Immature Granulocytes: 1 %
Lymphocytes Relative: 13 %
Lymphs Abs: 1.4 10*3/uL (ref 0.7–4.0)
MCH: 29.2 pg (ref 26.0–34.0)
MCHC: 32.2 g/dL (ref 30.0–36.0)
MCV: 90.6 fL (ref 80.0–100.0)
Monocytes Absolute: 0.7 10*3/uL (ref 0.1–1.0)
Monocytes Relative: 7 %
Neutro Abs: 8.3 10*3/uL — ABNORMAL HIGH (ref 1.7–7.7)
Neutrophils Relative %: 77 %
Platelets: 297 10*3/uL (ref 150–400)
RBC: 2.67 MIL/uL — ABNORMAL LOW (ref 3.87–5.11)
RDW: 13.9 % (ref 11.5–15.5)
WBC: 10.7 10*3/uL — ABNORMAL HIGH (ref 4.0–10.5)
nRBC: 0.3 % — ABNORMAL HIGH (ref 0.0–0.2)

## 2018-01-22 NOTE — Progress Notes (Signed)
Physical Therapy Treatment Patient Details Name: Jade Price MRN: [id number] DOB: 28-Jul-1950 Today's Date: 01/22/2018    History of Present Illness 68 y.o. female admitted on 01/15/18 for elective L5-S1 anterior lumbar interbody fusion and R anterior lateral lumbar interbody fusion L1-5. 2nd part of fusion on 01/19/2018 per pt. Pt with significant PMH of RLS, lumbar radiculopathy, arthritis, and anxiety.  Pt underwent a second phase of surgery on 01/19/18 T8-pelvis fusion.      PT Comments    Pt is more alert and oriented today, able to walk a bit further at one time than yesterday, but generally still requiring min assist for most mobility.  PT will continue to follow acutely for safe mobility progression  Follow Up Recommendations  Supervision for mobility/OOB;CIR     Equipment Recommendations  Rolling walker with 5" wheels    Recommendations for Other Services   NA     Precautions / Restrictions Precautions Precautions: Fall;Back Required Braces or Orthoses: Spinal Brace Spinal Brace: Thoracolumbosacral orthotic Restrictions Weight Bearing Restrictions: No    Mobility  Bed Mobility Overal bed mobility: Needs Assistance Bed Mobility: Rolling;Sidelying to Sit Rolling: Supervision Sidelying to sit: Min assist       General bed mobility comments: Min hand held assist to pull up to sitting EOB.   Transfers Overall transfer level: Needs assistance Equipment used: Rolling walker (2 wheeled) Transfers: Sit to/from Stand Sit to Stand: Min assist         General transfer comment: Min assist to help steady pt for balance, stabilize RW during fast transition of hands.    Ambulation/Gait Ambulation/Gait assistance: Min assist Gait Distance (Feet): 100 Feet Assistive device: Rolling walker (2 wheeled) Gait Pattern/deviations: Step-through pattern;Decreased weight shift to right;Decreased step length - right Gait velocity: decreased Gait velocity interpretation: <1.8 ft/sec,  indicate of risk for recurrent falls General Gait Details: Pt needed min assist, especially as she fatigued and R leg began to drag more than initially.  Pt's knees continue to be flexed and soft as she fatigues as well, pt is heavily dependent on RW support in standing to prevent falling.           Balance Overall balance assessment: Needs assistance Sitting-balance support: Feet supported;Bilateral upper extremity supported Sitting balance-Leahy Scale: Poor Sitting balance - Comments: Unable to remove both hands from bed and maintain balance.    Standing balance support: Bilateral upper extremity supported Standing balance-Leahy Scale: Poor Standing balance comment: needs support from RW and therapist.                             Cognition Arousal/Alertness: Awake/alert Behavior During Therapy: WFL for tasks assessed/performed Overall Cognitive Status: Within Functional Limits for tasks assessed                                 General Comments: Better today after blood products yesterday.              Pertinent Vitals/Pain Pain Assessment: Faces Faces Pain Scale: Hurts even more Pain Location: back/pelvis and R LE Pain Descriptors / Indicators: Burning;Grimacing;Guarding Pain Intervention(s): Limited activity within patient's tolerance;Monitored during session;Premedicated before session;Repositioned           PT Goals (current goals can now be found in the care plan section) Acute Rehab PT Goals Patient Stated Goal: to go home and improve right leg pain Progress towards PT  goals: Progressing toward goals    Frequency    Min 5X/week      PT Plan Current plan remains appropriate       AM-PAC PT "6 Clicks" Mobility   Outcome Measure  Help needed turning from your back to your side while in a flat bed without using bedrails?: None Help needed moving from lying on your back to sitting on the side of a flat bed without using  bedrails?: A Little Help needed moving to and from a bed to a chair (including a wheelchair)?: A Little Help needed standing up from a chair using your arms (e.g., wheelchair or bedside chair)?: A Little Help needed to walk in hospital room?: A Little Help needed climbing 3-5 steps with a railing? : A Little 6 Click Score: 19    End of Session Equipment Utilized During Treatment: Back brace;Gait belt Activity Tolerance: Patient limited by pain Patient left: in chair;with call bell/phone within reach Nurse Communication: Mobility status PT Visit Diagnosis: Muscle weakness (generalized) (M62.81);Difficulty in walking, not elsewhere classified (R26.2);Other symptoms and signs involving the nervous system (R29.898);Pain Pain - Right/Left: Right Pain - part of body: Leg     Time: 1140-1157 PT Time Calculation (min) (ACUTE ONLY): 17 min  Charges:  $Gait Training: 8-22 mins            Lita Flynn B. Khristian Phillippi, PT, DPT  Acute Rehabilitation [phone] pager #[phone] office            01/22/2018, 12:04 PM

## 2018-01-22 NOTE — Progress Notes (Addendum)
Subjective: Patient reports "I think I'm going to be better today"  Objective: Vital signs in last 24 hours: Temp:  [97.8 F (36.6 C)-100.4 F (38 C)] 98.4 F (36.9 C) (01/16 0630) Pulse Rate:  [51-104] 86 (01/16 0630) Resp:  [16-18] 16 (01/16 0630) BP: (88-127)/(41-98) 114/67 (01/16 0630) SpO2:  [76 %-94 %] 94 % (01/15 1542)  Intake/Output from previous day: 01/15 0701 - 01/16 0700 In: 592.5 [P.O.:240; Blood:352.5] Out: 220 [Drains:220] Intake/Output this shift: No intake/output data recorded.  Alert, conversant. Back pain persists, but under better control. Feeling better overall since transfusion. No BM yet, poor appetite, belly firm but not distended, not passing gas overnight. Incisions left abdomen, right side, and thoraco-lumbar without erythema, swelling, or drainage beneath honeycomb and Dermabond.   Lab Results: Recent Labs    01/21/18 1408  WBC 16.6*  HGB 5.5*  HCT 17.0*  PLT 309   BMET No results for input(s): NA, K, CL, CO2, GLUCOSE, BUN, CREATININE, CALCIUM in the last 72 hours.  Studies/Results: Dg Chest Port 1 View  Result Date: 01/21/2018 CLINICAL DATA:  Fever today. EXAM: PORTABLE CHEST 1 VIEW COMPARISON:  Limited correlation made with scoliosis radiographs 01/15/2018. FINDINGS: 1505 hours. The heart size and mediastinal contours are stable. There is stable asymmetric elevation of the right hemidiaphragm. There is a new ill defined nodular density at the right lung base, not seen on recent spine radiographs and therefore favored to reflect subsegmental atelectasis. A small nodule is difficult to exclude. There is no confluent airspace opacity, pleural effusion or pneumothorax. Interval thoracolumbar fusion noted, incompletely visualized. IMPRESSION: Probable focal atelectasis at the right lung base in this recently postoperative patient. No confluent airspace opacity to suggest pneumonia. Two-view chest radiographic follow-up recommended when the patient is  able. Electronically Signed   By: Carey Bullocks M.D.   On: 01/21/2018 15:23    Assessment/Plan: Improving slowly  LOS: 7 days  Continue to mobilize in brace, CBC per Dr. Venetia Maxon (entered). She will request Dulcolax supp. CIR hopeful as she improves.   Georgiann Cocker 01/22/2018, 7:37 AM  Patient feeling better after transfusion for acute blood loss anemia.

## 2018-01-22 NOTE — Progress Notes (Signed)
Inpatient Rehabilitation Admissions Coordinator  I met with patient at bedside, spouse not present. She states improvement in how she feels since yesterday and her blood transfusions. I have encouraged her to use incentive spirometer as much as possible when awake. We will follow up to assist with planning admit to CIR when medically ready and as long as she and her spouse would like to continue CIR admit rather than SNF.  Danne Baxter, RN, MSN Rehab Admissions Coordinator [phone] 01/22/2018 10:52 AM

## 2018-01-22 NOTE — PMR Pre-admission (Signed)
PMR Admission Coordinator Pre-Admission Assessment  Patient: Jade Price is an 68 y.o., female MRN: [id number] DOB: October 19, 1950 Height: 5\' 5"  (165.1 cm) Weight: 65.8 kg              Insurance Information HMO:     PPO:      PCP:      IPA:      80/20:      OTHER: no HMO PRIMARY: Medicare a and b      Policy#: [id number]      Subscriber: pt Benefits:  Phone #: passport online     Name: 01/21/2018 Eff. Date: 02/07/2017     Deduct: $1408      Out of Pocket Max: none      Life Max: none CIR: 100%      SNF: 20 full days Outpatient: 80%     Co-Pay: 20% Home Health: 100%      Co-Pay: none DME: 80%     Co-Pay: 20% Providers: pt choice  SECONDARY: BCBS supplement      Policy#: [id number]      Subscriber: pt  Medicaid Application Date:       Case Manager:  Disability Application Date:       Case Worker:   Emergency Contact Information Contact Information    Name Relation Home Work Mobile   Carbin,STEVE; DR Spouse   [phone]     Current Medical History  Patient Admitting Diagnosis: Lumbar and thoracic spinal stenosis  S/p L5-S1 anterior fusion as well as L1-L5 XLIF, T8 through ilium decompression posterior fusion  History of Present Illness: Jade Price is a 68 y.o.right handed female with history of hyperlipidemia, chronic back pain. Presented 01/15/2018 with low back pain radiating to the lower extremities. X-rays and imaging revealed spinal stenosis and scoliosis with foraminal stenosis of lateral listhesis L3-4 stenosis with radiculopathy. Underwent L5-S1 anterior lumbar interbody fusion, L1-2, 2-3, 3-4, 4-5 anterior lateral lumbar interbody fusion 01/15/2018 per Dr. Venetia Maxon as well as second part of fusion 01/19/2018. Hospital course pain management. TLSO back brace and out of bed applied in sitting position. Hgb 5.5 on 1/16 and transfused 2 units PRBCS. Fevers with  CXR probable focal atelectasis at the right lung base. No confluent airspace opacity to suggest PNA. UA negative. Patient  encouraged to use incentive spirometer more frequently.   Past Medical History  Past Medical History:  Diagnosis Date  . Anxiety    situitional  . Arthritis   . Back pain 02/2017  . Complication of anesthesia   . Degenerative lumbar spinal stenosis   . Disc displacement, lumbar   . GERD (gastroesophageal reflux disease)   . High cholesterol   . Hip pain 02/2017  . Leg pain    While walking  . Lumbar radiculopathy   . Osteopenia 10/01/2017  . PONV (postoperative nausea and vomiting)   . RLS (restless legs syndrome)   . Sciatic notch pain, right   . Scoliosis   . Spondylolisthesis    Lumber region  . Staph infection 2012   Finger    Family History  family history includes Alzheimer's disease in her mother; Cancer in her mother.  Prior Rehab/Hospitalizations:  Has the patient had major surgery during 100 days prior to admission? No  Current Medications   Current Facility-Administered Medications:  .  0.9 %  sodium chloride infusion, 250 mL, Intravenous, Continuous, Maeola Harman, MD .  acetaminophen (TYLENOL) tablet 650 mg, 650 mg, Oral, Q4H PRN, 650 mg at 01/22/18 1600 **OR** acetaminophen (  TYLENOL) suppository 650 mg, 650 mg, Rectal, Q4H PRN, Maeola HarmanStern, Joseph, MD .  alum & mag hydroxide-simeth (MAALOX/MYLANTA) 200-200-20 MG/5ML suspension 30 mL, 30 mL, Oral, Q6H PRN, Maeola HarmanStern, Joseph, MD .  bisacodyl (DULCOLAX) suppository 10 mg, 10 mg, Rectal, Daily PRN, Maeola HarmanStern, Joseph, MD, 10 mg at 01/23/18 0933 .  bismuth subsalicylate (PEPTO BISMOL) chewable tablet 524 mg, 524 mg, Oral, PRN, Maeola HarmanStern, Joseph, MD .  [COMPLETED] 6 CHG cloth bath night before surgery, , , Once **AND** 6 CHG cloth bath AM of surgery, , , Once **AND** [COMPLETED] Chlorhexidine Gluconate Cloth 2 % PADS 6 each, 6 each, Topical, Once, 6 each at 01/19/18 0635 **AND** Chlorhexidine Gluconate Cloth 2 % PADS 6 each, 6 each, Topical, Once, Maeola HarmanStern, Joseph, MD .  dextrose 5 % and 0.45 % NaCl with KCl 20 mEq/L infusion, ,  Intravenous, Continuous, Maeola HarmanStern, Joseph, MD, Stopped at 01/20/18 1943 .  diazepam (VALIUM) tablet 5 mg, 5 mg, Oral, Q6H PRN, Maeola HarmanStern, Joseph, MD, 5 mg at 01/23/18 0933 .  docusate sodium (COLACE) capsule 100 mg, 100 mg, Oral, BID, Maeola HarmanStern, Joseph, MD, 100 mg at 01/23/18 0933 .  HYDROmorphone (DILAUDID) tablet 4 mg, 4 mg, Oral, Q2H PRN, Maeola HarmanStern, Joseph, MD, 4 mg at 01/23/18 0651 .  LORazepam (ATIVAN) tablet 0.5 mg, 0.5 mg, Oral, Q4H PRN, Maeola HarmanStern, Joseph, MD, 0.5 mg at 01/23/18 0933 .  menthol-cetylpyridinium (CEPACOL) lozenge 3 mg, 1 lozenge, Oral, PRN **OR** phenol (CHLORASEPTIC) mouth spray 1 spray, 1 spray, Mouth/Throat, PRN, Maeola HarmanStern, Joseph, MD .  methocarbamol (ROBAXIN) tablet 500 mg, 500 mg, Oral, Q6H PRN, 500 mg at 01/23/18 0500 **OR** methocarbamol (ROBAXIN) 500 mg in dextrose 5 % 50 mL IVPB, 500 mg, Intravenous, Q6H PRN, Maeola HarmanStern, Joseph, MD, Last Rate: 100 mL/hr at 01/19/18 2127, 500 mg at 01/19/18 2127 .  [DISCONTINUED] ondansetron (ZOFRAN) tablet 4 mg, 4 mg, Oral, Q6H PRN **OR** ondansetron (ZOFRAN) injection 4 mg, 4 mg, Intravenous, Q6H PRN, Maeola HarmanStern, Joseph, MD, 4 mg at 01/22/18 0143 .  ondansetron (ZOFRAN) tablet 4 mg, 4 mg, Oral, Q6H PRN **OR** ondansetron (ZOFRAN) injection 4 mg, 4 mg, Intravenous, Q6H PRN, Maeola HarmanStern, Joseph, MD .  oxyCODONE (Oxy IR/ROXICODONE) immediate release tablet 5-10 mg, 5-10 mg, Oral, Q3H PRN, Maeola HarmanStern, Joseph, MD, 10 mg at 01/23/18 0933 .  pantoprazole (PROTONIX) EC tablet 40 mg, 40 mg, Oral, QHS, Maeola HarmanStern, Joseph, MD, 40 mg at 01/22/18 2208 .  polyethylene glycol (MIRALAX / GLYCOLAX) packet 17 g, 17 g, Oral, Daily PRN, Maeola HarmanStern, Joseph, MD .  promethazine (PHENERGAN) injection 12.5 mg, 12.5 mg, Intravenous, Q6H PRN, Maeola HarmanStern, Joseph, MD, 12.5 mg at 01/21/18 1130 .  rOPINIRole (REQUIP) tablet 4 mg, 4 mg, Oral, QHS, Maeola HarmanStern, Joseph, MD, 4 mg at 01/22/18 2208 .  sodium chloride flush (NS) 0.9 % injection 3 mL, 3 mL, Intravenous, Q12H, Maeola HarmanStern, Joseph, MD, 3 mL at 01/23/18 [phone] .  sodium chloride  flush (NS) 0.9 % injection 3 mL, 3 mL, Intravenous, PRN, Maeola HarmanStern, Joseph, MD .  sodium phosphate (FLEET) 7-19 GM/118ML enema 1 enema, 1 enema, Rectal, Once PRN, Maeola HarmanStern, Joseph, MD .  zolpidem Remus Loffler(AMBIEN) tablet 5 mg, 5 mg, Oral, QHS PRN, Maeola HarmanStern, Joseph, MD, 5 mg at 01/16/18 2118  Patients Current Diet:  Diet Order            Diet - low sodium heart healthy        Diet regular Room service appropriate? Yes; Fluid consistency: Thin  Diet effective now  Precautions / Restrictions Precautions Precautions: Fall, Back Precaution Booklet Issued: Yes (comment) Precaution Comments: handout provided previously, reviewed with patinet and required cueing to recall twisting  Spinal Brace: Thoracolumbosacral orthotic Restrictions Weight Bearing Restrictions: No   Has the patient had 2 or more falls or a fall with injury in the past year?No  Prior Activity Level Community (5-7x/wk): independent; active; drives  Journalist, newspaper / Equipment Home Assistive Devices/Equipment: Eyeglasses, Other (Comment), Built-in shower seat Home Equipment: Shower seat - built in, [place] - 2 wheels, Hand held shower head  Prior Device Use: Indicate devices/aids used by the patient prior to current illness, exacerbation or injury? None of the above  Prior Functional Level Prior Function Level of Independence: Independent Comments: drives, retired, husband reports she has been refusing to use RW, but likely needed to PTA.  No h/o falls. Husband is a primary care/OBGYN doc  Self Care: Did the patient need help bathing, dressing, using the toilet or eating?  Independent  Indoor Mobility: Did the patient need assistance with walking from room to room (with or without device)? Independent  Stairs: Did the patient need assistance with internal or external stairs (with or without device)? Independent  Functional Cognition: Did the patient need help planning regular tasks such as shopping or  remembering to take medications? Independent  Current Functional Level Cognition  Overall Cognitive Status: Within Functional Limits for tasks assessed Current Attention Level: Sustained Orientation Level: Oriented X4 General Comments: Better today after blood products yesterday.     Extremity Assessment (includes Sensation/Coordination)  Upper Extremity Assessment: Generalized weakness  Lower Extremity Assessment: Defer to PT evaluation RLE Deficits / Details: right leg continues to have numbness and weakness, difficulty WB through the leg with signs of instability in WB.  RLE Sensation: decreased light touch, history of peripheral neuropathy    ADLs  Overall ADL's : Needs assistance/impaired Eating/Feeding: Set up, Sitting Eating/Feeding Details (indicate cue type and reason): encouragement to self feed, education of positoining Grooming: Set up, Supervision/safety, Sitting Upper Body Bathing: Set up, Sitting Lower Body Bathing: Maximal assistance, Sitting/lateral leans Lower Body Bathing Details (indicate cue type and reason): decreased reach to B feet, cannot complete figure 4 technique, pain limiting  Upper Body Dressing : Minimal assistance, Sitting Upper Body Dressing Details (indicate cue type and reason): able to don initial portion of brace but required assistance to locate and pull velcro straps Lower Body Dressing: Maximal assistance, Sitting/lateral leans Lower Body Dressing Details (indicate cue type and reason): attempted crossing legs over knees seated eob. improvement with LLE but still unable. reports she was introduced to A/E and plans on using that for LB ADLs Toilet Transfer: Min guard, RW, Ambulation, Regular Toilet, Grab bars Toilet Transfer Details (indicate cue type and reason): heavy reliance on grab bars for sit/stand,stand/sit Toileting- Clothing Manipulation and Hygiene: Sit to/from stand, Min guard Toileting - Clothing Manipulation Details (indicate cue  type and reason): pt. able to perform front peri care in standing. cues not to "twist" to flush toilet Functional mobility during ADLs: Min guard, Rolling walker General ADL Comments: continues to be limited by pain, nausea today    Mobility  Overal bed mobility: Needs Assistance Bed Mobility: Rolling, Sidelying to Sit Rolling: Supervision Sidelying to sit: Min guard Sit to sidelying: Min assist General bed mobility comments: hob flat, exiting on R side. no use of rails. emphasis on log roll technique to maintain back precuations.  states bed at home is very high, was using a step stool.  her son is bring a twin be and setting it up in their dining room and it will be lower and easier for her to use    Transfers  Overall transfer level: Needs assistance Equipment used: Rolling walker (2 wheeled) Transfers: Sit to/from Stand, Stand Pivot Transfers Sit to Stand: Min guard Stand pivot transfers: Min guard General transfer comment: Min assist to help steady pt for balance, stabilize RW during fast transition of hands.      Ambulation / Gait / Stairs / Wheelchair Mobility  Ambulation/Gait Ambulation/Gait assistance: Editor, commissioning (Feet): 100 Feet Assistive device: Rolling walker (2 wheeled) Gait Pattern/deviations: Step-through pattern, Decreased weight shift to right, Decreased step length - right General Gait Details: Pt needed min assist, especially as she fatigued and R leg began to drag more than initially.  Pt's knees continue to be flexed and soft as she fatigues as well, pt is heavily dependent on RW support in standing to prevent falling.  Gait velocity: decreased Gait velocity interpretation: <1.8 ft/sec, indicate of risk for recurrent falls    Posture / Balance Dynamic Sitting Balance Sitting balance - Comments: Unable to remove both hands from bed and maintain balance.  Balance Overall balance assessment: Needs assistance Sitting-balance support: Feet supported,  Bilateral upper extremity supported Sitting balance-Leahy Scale: Poor Sitting balance - Comments: Unable to remove both hands from bed and maintain balance.  Standing balance support: Bilateral upper extremity supported Standing balance-Leahy Scale: Poor Standing balance comment: needs support from RW and therapist.     Special needs/care consideration BiPAP/CPAP n/a CPM : NA Continuous Drip IV n/a Dialysis: no        Days: no Life Vest: no Oxygen: no, on RA Special Bed: no Trach Size: no Wound Vac (area): no      Location: no Skin abdomen with surgical incision with honeycomb dressing; Back with surgical incisions and honey comb dressings Bowel mgmt: continent; constipation; LBM: PTA. None since.  Bladder mgmt: continent Diabetic mgmt n/a Anxious   Previous Home Environment Living Arrangements: Spouse/significant other  Lives With: Spouse Available Help at Discharge: Family, Available 24 hours/day Type of Home: House Home Layout: Multi-level, Able to live on main level with bedroom/bathroom Home Access: Stairs to enter Entrance Stairs-Rails: None Entrance Stairs-Number of Steps: 2 Bathroom Shower/Tub: Psychologist, counselling, Sport and exercise psychologist: Standard Bathroom Accessibility: Yes How Accessible: Accessible via walker Home Care Services: No  Discharge Living Setting Plans for Discharge Living Setting: Patient's home, Lives with (comment)(spouse) Type of Home at Discharge: House Discharge Home Layout: Multi-level, Able to live on main level with bedroom/bathroom Discharge Home Access: Stairs to enter Entrance Stairs-Rails: None Entrance Stairs-Number of Steps: 2 Discharge Bathroom Shower/Tub: Walk-in shower Discharge Bathroom Toilet: Standard Discharge Bathroom Accessibility: Yes How Accessible: Accessible via walker Does the patient have any problems obtaining your medications?: No  Social/Family/Support Systems Patient Roles: Spouse, Parent Contact Information: spouse,  Dr. Caroleen Hamman Anticipated Caregiver: spouse Anticipated Caregiver's Contact Information: [phone] cell Ability/Limitations of Caregiver: none Caregiver Availability: 24/7 Discharge Plan Discussed with Primary Caregiver: Yes Is Caregiver In Agreement with Plan?: Yes Does Caregiver/Family have Issues with Lodging/Transportation while Pt is in Rehab?: No  Goals/Additional Needs Patient/Family Goal for Rehab: Mod I with PT and OT Expected length of stay: ELOS 7 to 10 days Pt/Family Agrees to Admission and willing to participate: Yes Program Orientation Provided & Reviewed with Pt/Caregiver Including Roles  & Responsibilities: Yes  Decrease burden of Care through IP rehab admission: n/a  Possible need for  SNF placement upon discharge: not anticipated  Patient Condition: This patient's medical and functional status has changed since the consult dated: 01/21/2018 in which the Rehabilitation Physician determined and documented that the patient's condition is appropriate for intensive rehabilitative care in an inpatient rehabilitation facility. See "History of Present Illness" (above) for medical update. Functional changes are: improvement in transfers from Min A to Min G and improvement from 3 feet ambulation with Min A to 100 feet Min A. Pt has improved in UB dressing from Mod A to Min A and improved in LB dressing from Total A to Max A. Patient's medical and functional status update has been discussed with the Rehabilitation physician and patient remains appropriate for inpatient rehabilitation. Will admit to inpatient rehab today.  Preadmission Screen Completed By: Ottie GlazierBarbara Boyette RN MSN with updates by  Nanine MeansKelly Margarite Vessel, 01/23/2018 12:12 PM ______________________________________________________________________   Discussed status with Dr. Riley KillSwartz on 01/23/18 at 12:11PM and received telephone approval for admission today.  Admission Coordinator: Ottie GlazierBarbara Boyette RN MSN with updates by  Nanine MeansKelly Mohamud Mrozek,  time 12:11PM/Date 01/23/18.

## 2018-01-22 NOTE — Progress Notes (Signed)
Patient had a much better day as far as mentation and pain control.   Ambulated first thing in the morning, gas but no bm, performed own hygiene at the sink, walked the halls with therapies.   Blood pressure more stable after blood administration.

## 2018-01-23 ENCOUNTER — Encounter (HOSPITAL_COMMUNITY): Payer: Self-pay

## 2018-01-23 ENCOUNTER — Inpatient Hospital Stay (HOSPITAL_COMMUNITY)
Admission: RE | Admit: 2018-01-23 | Discharge: 2018-01-30 | DRG: 560 | Disposition: A | Discharge status: Home Health | Payer: Medicare Other | Source: Intra-hospital | Attending: Physical Medicine & Rehabilitation | Admitting: Physical Medicine & Rehabilitation

## 2018-01-23 ENCOUNTER — Other Ambulatory Visit: Payer: Self-pay

## 2018-01-23 DIAGNOSIS — K5901 Slow transit constipation: Secondary | ICD-10-CM | POA: Diagnosis not present

## 2018-01-23 DIAGNOSIS — M5415 Radiculopathy, thoracolumbar region: Secondary | ICD-10-CM

## 2018-01-23 DIAGNOSIS — Z4789 Encounter for other orthopedic aftercare: Principal | ICD-10-CM

## 2018-01-23 DIAGNOSIS — M5410 Radiculopathy, site unspecified: Secondary | ICD-10-CM | POA: Diagnosis present

## 2018-01-23 DIAGNOSIS — M961 Postlaminectomy syndrome, not elsewhere classified: Secondary | ICD-10-CM | POA: Diagnosis not present

## 2018-01-23 DIAGNOSIS — M5416 Radiculopathy, lumbar region: Secondary | ICD-10-CM | POA: Diagnosis present

## 2018-01-23 DIAGNOSIS — G2581 Restless legs syndrome: Secondary | ICD-10-CM | POA: Diagnosis present

## 2018-01-23 DIAGNOSIS — K5900 Constipation, unspecified: Secondary | ICD-10-CM | POA: Diagnosis present

## 2018-01-23 DIAGNOSIS — Z981 Arthrodesis status: Secondary | ICD-10-CM

## 2018-01-23 DIAGNOSIS — D62 Acute posthemorrhagic anemia: Secondary | ICD-10-CM | POA: Diagnosis present

## 2018-01-23 DIAGNOSIS — E785 Hyperlipidemia, unspecified: Secondary | ICD-10-CM | POA: Diagnosis present

## 2018-01-23 DIAGNOSIS — M7989 Other specified soft tissue disorders: Secondary | ICD-10-CM | POA: Diagnosis not present

## 2018-01-23 DIAGNOSIS — Z820 Family history of epilepsy and other diseases of the nervous system: Secondary | ICD-10-CM | POA: Diagnosis not present

## 2018-01-23 DIAGNOSIS — M48062 Spinal stenosis, lumbar region with neurogenic claudication: Secondary | ICD-10-CM | POA: Diagnosis not present

## 2018-01-23 DIAGNOSIS — E876 Hypokalemia: Secondary | ICD-10-CM | POA: Diagnosis not present

## 2018-01-23 LAB — TYPE AND SCREEN
ABO/RH(D): O POS
Antibody Screen: NEGATIVE
Unit division: 0
Unit division: 0

## 2018-01-23 LAB — BPAM RBC
Blood Product Expiration Date: 202002132359
Blood Product Expiration Date: 202002132359
ISSUE DATE / TIME: 202001151636
ISSUE DATE / TIME: 202001160225
Unit Type and Rh: 5100
Unit Type and Rh: 5100

## 2018-01-23 MED ORDER — METHOCARBAMOL 1000 MG/10ML IJ SOLN
500.0000 mg | Freq: Four times a day (QID) | INTRAVENOUS | Status: DC | PRN
  Filled 2018-01-23: qty 5

## 2018-01-23 MED ORDER — ROPINIROLE HCL 1 MG PO TABS
4.0000 mg | ORAL_TABLET | Freq: Every day | ORAL | Status: DC
  Administered 2018-01-23 – 2018-01-29 (×7): 4 mg via ORAL
  Filled 2018-01-23 (×8): qty 4

## 2018-01-23 MED ORDER — SORBITOL 70 % SOLN
30.0000 mL | Freq: Every day | Status: DC | PRN
  Administered 2018-01-23 – 2018-01-29 (×4): 30 mL via ORAL
  Filled 2018-01-23 (×4): qty 30

## 2018-01-23 MED ORDER — MORPHINE SULFATE ER 15 MG PO TBCR
15.0000 mg | EXTENDED_RELEASE_TABLET | Freq: Two times a day (BID) | ORAL | Status: DC
  Administered 2018-01-23 – 2018-01-26 (×6): 15 mg via ORAL
  Filled 2018-01-23 (×6): qty 1

## 2018-01-23 MED ORDER — ACETAMINOPHEN 325 MG PO TABS
650.0000 mg | ORAL_TABLET | ORAL | Status: DC | PRN
  Administered 2018-01-23 – 2018-01-30 (×6): 650 mg via ORAL
  Filled 2018-01-23 (×6): qty 2

## 2018-01-23 MED ORDER — PANTOPRAZOLE SODIUM 40 MG PO TBEC
40.0000 mg | DELAYED_RELEASE_TABLET | Freq: Every day | ORAL | Status: DC
  Administered 2018-01-23 – 2018-01-26 (×4): 40 mg via ORAL
  Filled 2018-01-23 (×4): qty 1

## 2018-01-23 MED ORDER — SENNOSIDES-DOCUSATE SODIUM 8.6-50 MG PO TABS
1.0000 | ORAL_TABLET | Freq: Two times a day (BID) | ORAL | Status: DC
  Administered 2018-01-23 – 2018-01-26 (×6): 1 via ORAL
  Filled 2018-01-23 (×6): qty 1

## 2018-01-23 MED ORDER — METHOCARBAMOL 500 MG PO TABS: 500.0000 mg | ORAL_TABLET | Freq: Four times a day (QID) | ORAL | Status: DC | PRN

## 2018-01-23 MED ORDER — ALUM & MAG HYDROXIDE-SIMETH 200-200-20 MG/5ML PO SUSP
30.0000 mL | Freq: Four times a day (QID) | ORAL | Status: DC | PRN
  Administered 2018-01-25 – 2018-01-27 (×4): 30 mL via ORAL
  Filled 2018-01-23 (×5): qty 30

## 2018-01-23 MED ORDER — ENSURE ENLIVE PO LIQD: 237.0000 mL | Freq: Two times a day (BID) | ORAL | Status: DC

## 2018-01-23 MED ORDER — OXYCODONE HCL 5 MG PO TABS
5.0000 mg | ORAL_TABLET | ORAL | Status: DC | PRN
  Administered 2018-01-23 – 2018-01-28 (×31): 10 mg via ORAL
  Administered 2018-01-28: 5 mg via ORAL
  Administered 2018-01-28 – 2018-01-29 (×5): 10 mg via ORAL
  Administered 2018-01-29: 5 mg via ORAL
  Administered 2018-01-29 – 2018-01-30 (×7): 10 mg via ORAL
  Filled 2018-01-23 (×45): qty 2

## 2018-01-23 MED ORDER — ACETAMINOPHEN 650 MG RE SUPP: 650.0000 mg | RECTAL | Status: DC | PRN

## 2018-01-23 MED ORDER — POLYETHYLENE GLYCOL 3350 17 G PO PACK
17.0000 g | PACK | Freq: Every day | ORAL | Status: DC | PRN
  Administered 2018-01-23: 17 g via ORAL
  Filled 2018-01-23: qty 1

## 2018-01-23 MED ORDER — LORAZEPAM 0.5 MG PO TABS
0.5000 mg | ORAL_TABLET | ORAL | Status: DC | PRN
  Administered 2018-01-29 – 2018-01-30 (×2): 0.5 mg via ORAL
  Filled 2018-01-23 (×2): qty 1

## 2018-01-23 MED ORDER — ONDANSETRON HCL 4 MG/2ML IJ SOLN: 4.0000 mg | Freq: Four times a day (QID) | INTRAMUSCULAR | Status: DC | PRN

## 2018-01-23 MED ORDER — BISACODYL 10 MG RE SUPP
10.0000 mg | Freq: Every day | RECTAL | Status: DC | PRN
  Administered 2018-01-24: 10 mg via RECTAL
  Filled 2018-01-23: qty 1

## 2018-01-23 MED ORDER — ONDANSETRON HCL 4 MG PO TABS: 4.0000 mg | ORAL_TABLET | Freq: Four times a day (QID) | ORAL | Status: DC | PRN

## 2018-01-23 MED FILL — Heparin Sodium (Porcine) Inj 1000 Unit/ML: INTRAMUSCULAR | Qty: 30 | Status: AC

## 2018-01-23 MED FILL — Sodium Chloride IV Soln 0.9%: INTRAVENOUS | Qty: 1000 | Status: AC

## 2018-01-23 NOTE — Evaluation (Signed)
Physical Therapy Assessment and Plan  Patient Details  Name: Jade Price MRN: [id number] Date of Birth: 1950/05/04  PT Diagnosis: Abnormal posture, Difficulty walking, Impaired cognition, Low back pain and Muscle weakness Rehab Potential: Good ELOS: 5-7 days   Today's Date: 01/23/2018 PT Individual Time: 4034-7425 PT Individual Time Calculation (min): 26 min    Problem List:  Patient Active Problem List   Diagnosis Date Noted  . Radiculopathy 01/23/2018  . Status post surgery 01/15/2018  . Idiopathic scoliosis of thoracolumbar region 01/15/2018    Past Medical History:  Past Medical History:  Diagnosis Date  . Anxiety    situitional  . Arthritis   . Back pain 02/2017  . Complication of anesthesia   . Degenerative lumbar spinal stenosis   . Disc displacement, lumbar   . GERD (gastroesophageal reflux disease)   . High cholesterol   . Hip pain 02/2017  . Leg pain    While walking  . Lumbar radiculopathy   . Osteopenia 10/01/2017  . PONV (postoperative nausea and vomiting)   . RLS (restless legs syndrome)   . Sciatic notch pain, right   . Scoliosis   . Spondylolisthesis    Lumber region  . Staph infection 2012   Finger   Past Surgical History:  Past Surgical History:  Procedure Laterality Date  . ABDOMINAL EXPOSURE N/A 01/15/2018   Procedure: ABDOMINAL EXPOSURE;  Surgeon: Serafina Mitchell, MD;  Location: [place];  Service: Vascular;  Laterality: N/A;  . ANTERIOR LATERAL LUMBAR FUSION 4 LEVELS Right 01/15/2018   Procedure: Right Anterior Lateral Lumbar Interbody Fusion Lumbar One-Two, Two-Three, Three-Four, and Four-Five;  Surgeon: Erline Levine, MD;  Location: [place];  Service: Neurosurgery;  Laterality: Right;  Right Anterior Lateral Lumbar Interbody Fusion Lumbar One-Two, Two-Three, Three-Four, and Four-Five   . ANTERIOR LUMBAR FUSION N/A 01/15/2018   Procedure: Lumbar Five to Sacral One  Anterior lumbar interbody fusion;  Surgeon: Erline Levine, MD;  Location: [place];   Service: Neurosurgery;  Laterality: N/A;  Lumbar Five to Sacral One  Anterior lumbar interbody fusion  . APPLICATION OF INTRAOPERATIVE CT SCAN N/A 01/19/2018   Procedure: APPLICATION OF INTRAOPERATIVE CT SCAN;  Surgeon: Erline Levine, MD;  Location: [place];  Service: Neurosurgery;  Laterality: N/A;  . COLONOSCOPY    . FINGER SURGERY Right    infection  . POSTERIOR LUMBAR FUSION 4 LEVEL N/A 01/19/2018   Procedure: Thoracic Eight to pelvis fixation with Airo;  Surgeon: Erline Levine, MD;  Location: [place];  Service: Neurosurgery;  Laterality: N/A;    Assessment & Plan Clinical Impression: Patient is a 68 y.o. year old female with recent admission to the hospital on 01/15/2018 low back pain radiating to lower extremities. X-rays and imaging revealed spinal stenosis and scoliosis with foraminal stenosis of lateral listhesis L3-4 stenosis with radiculopathy. Plans were arranged for 2 part procedure and underwent L5-S1 anterior lumbar interbody fusion, L1-2, 2-3, 3-4, 4-5 anterior lateral lumbar interbody fusion 01/15/2018 per Dr. Vertell Limber as well as second part of fusion 01/19/2018. Hospital course pain management. Acute blood loss anemia 5.5 and transfuse 2 units pack red blood cells with latest hemoglobin 7.8.   Patient transferred to CIR on 01/23/2018 .   Patient currently requires min with mobility secondary to muscle weakness and decreased standing balance, decreased balance strategies and difficulty maintaining precautions.  Prior to hospitalization, patient was independent  with mobility and lived with Spouse in a House home.  Home access is 1+1Stairs to enter.  Patient will benefit  from skilled PT intervention to maximize safe functional mobility, minimize fall risk and decrease caregiver burden for planned discharge home with intermittent assist.  Anticipate patient will benefit from follow up [facility] at discharge.  PT - End of Session Activity Tolerance: Tolerates 10 - 20 min activity with multiple  rests Endurance Deficit: Yes PT Assessment Rehab Potential (ACUTE/IP ONLY): Good PT Patient demonstrates impairments in the following area(s): Balance;Endurance;Motor;Pain;Safety PT Transfers Functional Problem(s): Bed Mobility;Bed to Chair;Car;Furniture PT Locomotion Functional Problem(s): Stairs;Wheelchair Mobility;Ambulation PT Plan PT Intensity: Minimum of 1-2 x/day ,45 to 90 minutes PT Frequency: 5 out of 7 days PT Duration Estimated Length of Stay: 5-7 days PT Treatment/Interventions: Ambulation/gait training;Community reintegration;DME/adaptive equipment instruction;Neuromuscular re-education;Stair training;UE/LE Strength taining/ROM;Wheelchair propulsion/positioning;Balance/vestibular training;Discharge planning;Pain management;Therapeutic Activities;UE/LE Coordination activities;Functional electrical stimulation;Cognitive remediation/compensation;Functional mobility training;Patient/family education;Splinting/orthotics;Therapeutic Exercise PT Transfers Anticipated Outcome(s): mod I PT Locomotion Anticipated Outcome(s): mod I PT Recommendation Follow Up Recommendations: Home health PT Patient destination: Home Equipment Recommended: To be determined  Skilled Therapeutic Intervention Pt participated in skilled PT eval and was educated on PT POC and goals  PT Evaluation Precautions/Restrictions Precautions Precautions: Fall;Back Required Braces or Orthoses: Spinal Brace Spinal Brace: Thoracolumbosacral orthotic Restrictions Weight Bearing Restrictions: No Pain Pain Assessment Pain Scale: 0-10 Pain Score: 8  Pain Type: Acute pain;Surgical pain Pain Location: Back Pain Orientation: Lower;Mid Pain Descriptors / Indicators: Constant Pain Frequency: Constant Pain Onset: On-going Pain Intervention(s): RN made aware;Repositioned Home Living/Prior Functioning Home Living Available Help at Discharge: Family;Available 24 hours/day Type of Home: House Home Access: Stairs to  enter [employer] of Steps: 1+1 Entrance Stairs-Rails: None Home Layout: Multi-level;Able to live on main level with bedroom/bathroom  Lives With: Spouse Prior Function Level of Independence: Independent with basic ADLs;Independent with transfers;Independent with gait  Able to Take Stairs?: Yes Driving: Yes Vocation: Retired Comments: drives, retired, husband reports she has been refusing to use RW, but likely needed to PTA.  No h/o falls. Husband is a primary care/OBGYN doc  Cognition Overall Cognitive Status: Impaired/Different from baseline Orientation Level: Oriented to place;Oriented to person;Oriented to situation Memory: Impaired Memory Impairment: Decreased recall of new information Sensation Sensation Light Touch: Appears Intact Proprioception: Appears Intact Coordination Gross Motor Movements are Fluid and Coordinated: Yes Fine Motor Movements are Fluid and Coordinated: Yes Motor  Motor Motor - Skilled Clinical Observations: generalized weakness  Mobility Bed Mobility Bed Mobility: Rolling Right;Rolling Left;Right Sidelying to Sit Rolling Right: Minimal Assistance - Patient > 75% Rolling Left: Minimal Assistance - Patient > 75% Right Sidelying to Sit: Minimal Assistance - Patient > 75% Transfers Transfers: Risk manager Stand Pivot Transfers: Minimal Assistance - Patient > 75% Transfer (Assistive device): Rolling walker Locomotion  Gait Ambulation: Yes Gait Assistance: Minimal Assistance - Patient > 75% Gait Distance (Feet): 25 Feet Assistive device: Rolling walker  Trunk/Postural Assessment  Cervical Assessment Cervical Assessment: Within Functional Limits Thoracic Assessment Thoracic Assessment: (TLSO) Lumbar Assessment Lumbar Assessment: (TLSO) Postural Control Postural Control: Deficits on evaluation Righting Reactions: delayed  Balance Dynamic Sitting Balance Sitting balance - Comments: reliant on UE support in sitting EOB.   Dynamic Standing Balance Dynamic Standing - Comments: min A with RW Extremity Assessment      RLE Assessment General Strength Comments: knee 4-/5, ankle 4/5, hip 3-/5 LLE Assessment General Strength Comments: grossly 4/5    Refer to Care Plan for Long Term Goals  Recommendations for other services: None   Discharge Criteria: Patient will be discharged from PT if patient refuses treatment 3 consecutive times without medical reason, if  treatment goals not met, if there is a change in medical status, if patient makes no progress towards goals or if patient is discharged from hospital.  The above assessment, treatment plan, treatment alternatives and goals were discussed and mutually agreed upon: by patient  [facility] 01/23/2018, 3:47 PM

## 2018-01-23 NOTE — Discharge Summary (Signed)
Physician Discharge Summary  Patient ID: Jade Price Milson MRN: [id number] DOB/AGE: 66/06/1950 66 y.o.  Admit date: 01/15/2018 Discharge date: 01/23/2018  Admission Diagnoses: Scoliosis, and kyphoscoliosis, Idiopathic, spondylolisthesis, stenosis, radiculopathy, lumbago     Discharge Diagnoses: Scoliosis, and kyphoscoliosis, Idiopathic, spondylolisthesis, stenosis, radiculopathy, lumbago s/p Lumbar Five to Sacral One  Anterior lumbar interbody fusion (N/A) - Lumbar Five to Sacral One  Anterior lumbar interbody fusion Right Anterior Lateral Lumbar Interbody Fusion Lumbar One-Two, Two-Three, Three-Four, and Four-Five (Right) - Right Anterior Lateral Lumbar Interbody Fusion Lumbar One-Two, Two-Three, Three-Four, and Four-Five  ABDOMINAL EXPOSURE (N/A) and Thoracic Eight to pelvis fixation with Airo (N/A) APPLICATION OF INTRAOPERATIVE CT SCAN (N/A) with osteotomies L 12, L 23, L 34 levels, pedicle screw fixation, scoliotic curvature correction, posterolateral arthrodesis with autograft, allograft          Active Problems:   Status post surgery   Idiopathic scoliosis of thoracolumbar region   Discharged Condition: good  Hospital Course: Jade Price Doshier was admitted with dx scoliosis and radiculopathy. She underwent staged surgeries listed below, each without complication. She mobilized gradually with good strength following each stage. Transfusion of 2 units packed cells for Hgb of 5.3  After second surgery brought her Hgb to 7.8 and she showed significant improvement in endurance, strength, and pain control. Appetite has improved and she is passing flatus, though no BM yet. She will benefit from continued therapies through Inpatient Rehab.  Consults: vascular surgery  Significant Diagnostic Studies: radiology: X-Ray: intra-op  Treatments: surgery: stage 1: Lumbar Five to Sacral One  Anterior lumbar interbody fusion (N/A) - Lumbar Five to Sacral One  Anterior lumbar interbody fusion Right Anterior  Lateral Lumbar Interbody Fusion Lumbar One-Two, Two-Three, Three-Four, and Four-Five (Right) - Right Anterior Lateral Lumbar Interbody Fusion Lumbar One-Two, Two-Three, Three-Four, and Four-Five  ABDOMINAL EXPOSURE (N/A) Stage 2: Thoracic Eight to pelvis fixation with Airo (N/A) APPLICATION OF INTRAOPERATIVE CT SCAN (N/A) with osteotomies L 12, L 23, L 34 levels, pedicle screw fixation, scoliotic curvature correction, posterolateral arthrodesis with autograft, allograft        Discharge Exam: Blood pressure 116/61, pulse (!) 108, temperature 99.6 F (37.6 C), temperature source Oral, resp. rate 16, height 5\' 5"  (1.651 m), weight 65.8 kg, SpO2 99 %. Alert, conversant. Difficulty getting into a comfortable position at present. Incisions without erythema, swelling, or drainage left abdomen, right side, and thoraco-lumbar. Belly soft, nondistended, tender only at incision. Passing gas, reporting no discomfort or bloating. Good strength all extremities.     Disposition: Discharge disposition[facility]. Office f/u in 3-4 weeks with scoliosis x-rays. Will plan to continue Dilaudid 2-4mg  p.o. q2-3 hrs prn pain and Robaxin 500mg  q6hrs prn spasm (Valium 5mg  prn severe spasm)       Discharge Instructions    Diet - low sodium heart healthy   Complete by:  As directed    Increase activity slowly   Complete by:  As directed      Allergies as of 01/23/2018      Reactions   Gabapentin Other (See Comments)   Depression within 2 days      Medication List    STOP taking these medications   ALEVE 220 MG tablet Generic drug:  naproxen sodium     TAKE these medications   bismuth subsalicylate 262 MG chewable tablet Commonly known as:  PEPTO BISMOL Chew 524 mg by mouth as needed for indigestion or diarrhea or loose stools.   HYDROcodone-acetaminophen 5-325 MG tablet Commonly known as:  NORCO/VICODIN Take 1 tablet by mouth every 6 (six) hours as needed for moderate  pain.   NYQUIL SEVERE COLD/FLU 5-6.25-10-325 MG/15ML Liqd Generic drug:  Phenyleph-Doxylamine-DM-APAP Take by mouth.   omeprazole 20 MG tablet Commonly known as:  PRILOSEC OTC Take 40 mg by mouth daily.   rOPINIRole 4 MG tablet Commonly known as:  REQUIP Take 4 mg by mouth at bedtime.        Signed: Georgiann Cockeroteat, Brian 01/23/2018, 11:52 AM   Patient is making good progress.  Will likely benefit from inpatient Rehab.

## 2018-01-23 NOTE — Progress Notes (Addendum)
Social Work  Social Work Assessment and Plan  Patient Details  Name: Jade Price MRN: [id number] Date of Birth: [birth year]  Today's Date: 01/23/2018  Problem List:  Patient Active Problem List   Diagnosis Date Noted  . Radiculopathy 01/23/2018  . Status post surgery 01/15/2018  . Idiopathic scoliosis of thoracolumbar region 01/15/2018   Past Medical History:  Past Medical History:  Diagnosis Date  . Anxiety    situitional  . Arthritis   . Back pain 02/2017  . Complication of anesthesia   . Degenerative lumbar spinal stenosis   . Disc displacement, lumbar   . GERD (gastroesophageal reflux disease)   . High cholesterol   . Hip pain 02/2017  . Leg pain    While walking  . Lumbar radiculopathy   . Osteopenia 10/01/2017  . PONV (postoperative nausea and vomiting)   . RLS (restless legs syndrome)   . Sciatic notch pain, right   . Scoliosis   . Spondylolisthesis    Lumber region  . Staph infection 2012   Finger   Past Surgical History:  Past Surgical History:  Procedure Laterality Date  . ABDOMINAL EXPOSURE N/A 01/15/2018   Procedure: ABDOMINAL EXPOSURE;  Surgeon: Nada Libman, MD;  Location: [facility] OR;  Service: Vascular;  Laterality: N/A;  . ANTERIOR LATERAL LUMBAR FUSION 4 LEVELS Right 01/15/2018   Procedure: Right Anterior Lateral Lumbar Interbody Fusion Lumbar One-Two, Two-Three, Three-Four, and Four-Five;  Surgeon: Maeola Harman, MD;  Location: [facility] OR;  Service: Neurosurgery;  Laterality: Right;  Right Anterior Lateral Lumbar Interbody Fusion Lumbar One-Two, Two-Three, Three-Four, and Four-Five   . ANTERIOR LUMBAR FUSION N/A 01/15/2018   Procedure: Lumbar Five to Sacral One  Anterior lumbar interbody fusion;  Surgeon: Maeola Harman, MD;  Location: [facility] OR;  Service: Neurosurgery;  Laterality: N/A;  Lumbar Five to Sacral One  Anterior lumbar interbody fusion  . APPLICATION OF INTRAOPERATIVE CT SCAN N/A 01/19/2018   Procedure: APPLICATION OF INTRAOPERATIVE CT SCAN;  Surgeon:  Maeola Harman, MD;  Location: [facility] OR;  Service: Neurosurgery;  Laterality: N/A;  . COLONOSCOPY    . FINGER SURGERY Right    infection  . POSTERIOR LUMBAR FUSION 4 LEVEL N/A 01/19/2018   Procedure: Thoracic Eight to pelvis fixation with Airo;  Surgeon: Maeola Harman, MD;  Location: [facility] OR;  Service: Neurosurgery;  Laterality: N/A;   Social History:  reports that she has never smoked. She has never used smokeless tobacco. She reports previous alcohol use. She reports that she does not use drugs.  Family / Support Systems Marital Status: Married Patient Roles: Spouse, Parent, Volunteer Spouse/Significant Other: Brett Canales [phone] Children: Four children three as close as 2 hours away Other Supports: Friends and church members Anticipated Caregiver: Husband Ability/Limitations of Caregiver: None-retired last year Caregiver Availability: 24/7 Family Dynamics: Close knit faily all can depend upon one another and be there. Husband is here during the day and goes home at night due to his sleep apnea. Pt wants him to take care of himself. They have many friends and family who are involved and will make sure they have what they need.  Social History Preferred language: English Religion:  Cultural Background: No issues Education: Automotive engineer educated Read: Yes Write: Yes Employment Status: Retired Marine scientist Issues: No issues Guardian/Conservator: none-according to MD pt is capable of making her own decisions while here, husband is here daily also   Abuse/Neglect Abuse/Neglect Assessment Can Be Completed: Yes Physical Abuse: Denies Verbal Abuse: Denies Sexual Abuse: Denies Exploitation of patient/patient's  resources: Denies Self-Neglect: Denies  Emotional Status Pt's affect, behavior and adjustment status: Pt is motivated to do well here and make good progress, she knows it will be painful but will get better each day. She has always been independent and plans on being so  again once she heals and is having less pain. Recent Psychosocial Issues: other health issues managed by PCP Psychiatric History: History of anxiety takes medications which she finds helpful and will use her relaxation techniques. She may benefit from seeing neuro-psych while here will ask team and monitor while here. Substance Abuse History: No issues  Patient / Family Perceptions, Expectations & Goals Pt/Family understanding of illness & functional limitations: Pt and husband can explain her surgeries and her treatment plan going forward. She is glad to be here on rehab this is the last step before going home. She does talk with the MD and feels her questions and concerns are being addressed. Premorbid pt/family roles/activities: Wife, mother, grandmother, retiree, church member, friend, etc Anticipated changes in roles/activities/participation: resume Pt/family expectations/goals: Pt states: " I want to be as independent as I can be before going home, my husband will help but I want to do it myself."  Husband states: " She has always been one to do it on he rown even when she was having extreme pain."  [employer] Community Agencies: None Premorbid Home Care/DME Agencies: Other (Comment)(rw & tub seat) Transportation available at discharge: Husband Resource referrals recommended: Neuropsychology, Support group (specify)  Discharge Planning Living Arrangements: Spouse/significant other Support Systems: Spouse/significant other, Children, Friends/neighbors, Church/faith community Type of Residence: Private residence Insurance Resources: [employer] EntertainmentMedicare, Media plannerrivate Insurance (specify)(BCBS) Financial Resources: Restaurant manager, fast foodocial Security Financial Screen Referred: No Living Expenses: Own Money Management: Spouse, Patient Does the patient have any problems obtaining your medications?: No Home Management: Both husband will be doing this until she is able to again Patient/Family Preliminary Plans: Return  home with husband who can provide 24 hr care if needed. He is retired and can be available 24 hr. He comes daily and then goes home at night due to his sleep apnea and their dog. Will await therapy evaluations and work on discharge needs. Social Work Anticipated Follow Up Needs: HH/OP, Support Group  Clinical Impression Very pleasant female who realizes she will have pain and is trying to push through this to make progress and get home. Her husband is very involved and supportive he is here daily and will be her caregiver fi needed.  Mod/i and not need 24 hr care at discharge. Will see if would benefit from seeing neuro-psych while here and work on discharge needs.  Lucy Chrisupree, Nilda Keathley G 01/23/2018, 3:51 PM

## 2018-01-23 NOTE — H&P (Signed)
Physical Medicine and Rehabilitation Admission H&P     HPI: Jade Price is a 68 year old right-handed female with history of hyperlipidemia, chronic pain. Per chart review she lives with her spouse who is a retired Development worker, community. Independent prior to admission. Multilevel home. Spouse uses a cane himself. Presented 01/15/2018 low back pain radiating to lower extremities. X-rays and imaging revealed spinal stenosis and scoliosis with foraminal stenosis of lateral listhesis L3-4 stenosis with radiculopathy. Plans were arranged for 2 part procedure and underwent L5-S1 anterior lumbar interbody fusion, L1-2, 2-3, 3-4, 4-5 anterior lateral lumbar interbody fusion 01/15/2018 per Dr. Venetia Maxon as well as second part of fusion 01/19/2018. Hospital course pain management. Acute blood loss anemia 5.5 and transfuse 2 units pack red blood cells with latest hemoglobin 7.8. TLSO back brace when out of bed applied in sitting position. Therapy evaluations completed with recommendations of physical medicine rehabilitation consult. Patient was admitted for a comprehensive rehabilitation program.  Review of Systems  Constitutional: Negative for chills and fever.  HENT: Negative for hearing loss.   Eyes: Negative for blurred vision and double vision.  Respiratory: Negative for shortness of breath.   Cardiovascular: Negative for chest pain and leg swelling.  Gastrointestinal: Positive for constipation. Negative for nausea and vomiting.       GERD  Genitourinary: Negative for dysuria, flank pain and hematuria.  Musculoskeletal: Positive for back pain and myalgias.  Skin: Negative for rash.  Psychiatric/Behavioral:       Anxiety  All other systems reviewed and are negative.  Past Medical History:  Diagnosis Date  . Anxiety    situitional  . Arthritis   . Back pain 02/2017  . Complication of anesthesia   . Degenerative lumbar spinal stenosis   . Disc displacement, lumbar   . GERD (gastroesophageal reflux  disease)   . High cholesterol   . Hip pain 02/2017  . Leg pain    While walking  . Lumbar radiculopathy   . Osteopenia 10/01/2017  . PONV (postoperative nausea and vomiting)   . RLS (restless legs syndrome)   . Sciatic notch pain, right   . Scoliosis   . Spondylolisthesis    Lumber region  . Staph infection 2012   Finger   Past Surgical History:  Procedure Laterality Date  . ABDOMINAL EXPOSURE N/A 01/15/2018   Procedure: ABDOMINAL EXPOSURE;  Surgeon: Nada Libman, MD;  Location: [facility] OR;  Service: Vascular;  Laterality: N/A;  . ANTERIOR LATERAL LUMBAR FUSION 4 LEVELS Right 01/15/2018   Procedure: Right Anterior Lateral Lumbar Interbody Fusion Lumbar One-Two, Two-Three, Three-Four, and Four-Five;  Surgeon: Maeola Harman, MD;  Location: [facility] OR;  Service: Neurosurgery;  Laterality: Right;  Right Anterior Lateral Lumbar Interbody Fusion Lumbar One-Two, Two-Three, Three-Four, and Four-Five   . ANTERIOR LUMBAR FUSION N/A 01/15/2018   Procedure: Lumbar Five to Sacral One  Anterior lumbar interbody fusion;  Surgeon: Maeola Harman, MD;  Location: [facility] OR;  Service: Neurosurgery;  Laterality: N/A;  Lumbar Five to Sacral One  Anterior lumbar interbody fusion  . APPLICATION OF INTRAOPERATIVE CT SCAN N/A 01/19/2018   Procedure: APPLICATION OF INTRAOPERATIVE CT SCAN;  Surgeon: Maeola Harman, MD;  Location: [facility] OR;  Service: Neurosurgery;  Laterality: N/A;  . COLONOSCOPY    . FINGER SURGERY Right    infection  . POSTERIOR LUMBAR FUSION 4 LEVEL N/A 01/19/2018   Procedure: Thoracic Eight to pelvis fixation with Airo;  Surgeon: Maeola Harman, MD;  Location: [facility] OR;  Service: Neurosurgery;  Laterality: N/A;  Family History  Problem Relation Age of Onset  . Cancer Mother   . Alzheimer's disease Mother    Social History:  reports that she has never smoked. She has never used smokeless tobacco. She reports previous alcohol use. She reports that she does not use drugs. Allergies:  Allergies  Allergen  Reactions  . Gabapentin Other (See Comments)    Depression within 2 days   Medications Prior to Admission  Medication Sig Dispense Refill  . bismuth subsalicylate (PEPTO BISMOL) 262 MG chewable tablet Chew 524 mg by mouth as needed for indigestion or diarrhea or loose stools.    Mar[place] Kitchen HYDROcodone-acetaminophen (NORCO/VICODIN) 5-325 MG tablet Take 1 tablet by mouth every 6 (six) hours as needed for moderate pain.    Mar[place] Kitchen omeprazole (PRILOSEC OTC) 20 MG tablet Take 40 mg by mouth daily.    Mar[place] Kitchen Phenyleph-Doxylamine-DM-APAP (NYQUIL SEVERE COLD/FLU) 5-6.25-10-325 MG/15ML LIQD Take by mouth.    Mar[place] Kitchen rOPINIRole (REQUIP) 4 MG tablet Take 4 mg by mouth at bedtime.     . naproxen sodium (ALEVE) 220 MG tablet Take 660 mg by mouth 3 (three) times daily as needed (for pain or headache).       Drug Regimen Review Drug regimen was reviewed and remains appropriate with no significant issues identified  Home: Home Living Family/patient expects to be discharged to:: Private residence Living Arrangements: Spouse/significant other Available Help at Discharge: Family, Available 24 hours/day Type of Home: House Home Access: Stairs to enter [employer] of Steps: 2 Entrance Stairs-Rails: None Home Layout: Multi-level, Able to live on main level with bedroom/bathroom Bathroom Shower/Tub: Psychologist, counselling, Door [employer] Toilet: Standard Bathroom Accessibility: Yes Home Equipment: Information systems manager - built in, Environmental consultant - 2 wheels, Hand held shower head  Lives With: Spouse   Functional History: Prior Function Level of Independence: Independent Comments: drives, retired, husband reports she has been refusing to use RW, but likely needed to PTA.  No h/o falls. Husband is a primary care/OBGYN doc  Functional Status:  Mobility: Bed Mobility Overal bed mobility: Needs Assistance Bed Mobility: Rolling, Sidelying to Sit Rolling: Supervision Sidelying to sit: Min guard Sit to sidelying: Min assist General bed  mobility comments: hob flat, exiting on R side. no use of rails. emphasis on log roll technique to maintain back precuations.  states bed at home is very high, was using a step stool. her son is bring a twin be and setting it up in their dining room and it will be lower and easier for her to use Transfers Overall transfer level: Needs assistance Equipment used: Rolling walker (2 wheeled) Transfers: Sit to/from Stand, Stand Pivot Transfers Sit to Stand: Min guard Stand pivot transfers: Min guard General transfer comment: Min assist to help steady pt for balance, stabilize RW during fast transition of hands.   Ambulation/Gait Ambulation/Gait assistance: Min assist Gait Distance (Feet): 100 Feet Assistive device: Rolling walker (2 wheeled) Gait Pattern/deviations: Step-through pattern, Decreased weight shift to right, Decreased step length - right General Gait Details: Pt needed min assist, especially as she fatigued and R leg began to drag more than initially.  Pt's knees continue to be flexed and soft as she fatigues as well, pt is heavily dependent on RW support in standing to prevent falling.  Gait velocity: decreased Gait velocity interpretation: <1.8 ft/sec, indicate of risk for recurrent falls    ADL: ADL Overall ADL's : Needs assistance/impaired Eating/Feeding: Set up, Sitting Eating/Feeding Details (indicate cue type and reason): encouragement to self feed, education of positoining  Grooming: Set up, Supervision/safety, Sitting Upper Body Bathing: Set up, Sitting Lower Body Bathing: Maximal assistance, Sitting/lateral leans Lower Body Bathing Details (indicate cue type and reason): decreased reach to B feet, cannot complete figure 4 technique, pain limiting  Upper Body Dressing : Minimal assistance, Sitting Upper Body Dressing Details (indicate cue type and reason): able to don initial portion of brace but required assistance to locate and pull velcro straps Lower Body Dressing:  Maximal assistance, Sitting/lateral leans Lower Body Dressing Details (indicate cue type and reason): attempted crossing legs over knees seated eob. improvement with LLE but still unable. reports she was introduced to A/E and plans on using that for LB ADLs Toilet Transfer: Min guard, RW, Ambulation, Regular Toilet, Grab bars Toilet Transfer Details (indicate cue type and reason): heavy reliance on grab bars for sit/stand,stand/sit Toileting- Clothing Manipulation and Hygiene: Sit to/from stand, Min guard Toileting - Clothing Manipulation Details (indicate cue type and reason): pt. able to perform front peri care in standing. cues not to "twist" to flush toilet Functional mobility during ADLs: Min guard, Rolling walker General ADL Comments: continues to be limited by pain, nausea today  Cognition: Cognition Overall Cognitive Status: Within Functional Limits for tasks assessed Orientation Level: Oriented X4 Cognition Arousal/Alertness: Awake/alert Behavior During Therapy: WFL for tasks assessed/performed Overall Cognitive Status: Within Functional Limits for tasks assessed Area of Impairment: Memory, Attention Orientation Level: Time(reports 5pm) Current Attention Level: Sustained Memory: Decreased short-term memory General Comments: Better today after blood products yesterday.   Physical Exam: Blood pressure 107/66, pulse (!) 109, temperature 99.2 F (37.3 C), temperature source Oral, resp. rate 16, height 5\' 5"  (1.651 m), weight 65.8 kg, SpO2 100 %. Physical Exam  Vitals reviewed. Constitutional: She is oriented to person, place, and time. She appears well-developed.  HENT:  Head: Normocephalic.  Eyes: Pupils are equal, round, and reactive to light.  Neck: Normal range of motion.  Cardiovascular: Normal rate and regular rhythm. Exam reveals no friction rub.  No murmur heard. Respiratory: Effort normal. No respiratory distress. She has no wheezes. She has no rales.  GI: Soft. She  exhibits no distension. There is no abdominal tenderness. There is no rebound.  Musculoskeletal:        General: No edema.     Comments: Low and mid back tender. In discomfort in chair  Neurological: She is alert and oriented to person, place, and time. No cranial nerve deficit.  Patient is alert. She is a bit anxious. Follows full commands. Provides her name, age and date of birth. UE 4-5/5 in UE's. LE: 3+ HF, KE, 4/5 ADF/PF. No focal sensory deficits. DTR's 2+ LLE and 1+ RLE.   Skin: Skin is warm. No erythema.  Back incision cdi  Psychiatric: She has a normal mood and affect. Her behavior is normal.    Results for orders placed or performed during the hospital encounter of 01/15/18 (from the past 48 hour(s))  Urinalysis, Routine w reflex microscopic     Status: None   Collection Time: 01/21/18 12:40 PM  Result Value Ref Range   Color, Urine YELLOW YELLOW   APPearance CLEAR CLEAR   Specific Gravity, Urine 1.017 1.005 - 1.030   pH 6.0 5.0 - 8.0   Glucose, UA NEGATIVE NEGATIVE mg/dL   Hgb urine dipstick NEGATIVE NEGATIVE   Bilirubin Urine NEGATIVE NEGATIVE   Ketones, ur NEGATIVE NEGATIVE mg/dL   Protein, ur NEGATIVE NEGATIVE mg/dL   Nitrite NEGATIVE NEGATIVE   Leukocytes, UA NEGATIVE NEGATIVE    Comment:  Performed at [facility], [address] [place], [place] [address]  CBC     Status: Abnormal   Collection Time: 01/21/18  2:08 PM  Result Value Ref Range   WBC 16.6 (H) 4.0 - 10.5 K/uL   RBC 1.85 (L) 3.87 - 5.11 MIL/uL   Hemoglobin 5.5 (LL) 12.0 - 15.0 g/dL    Comment: REPEATED TO VERIFY THIS CRITICAL RESULT HAS VERIFIED AND BEEN CALLED TO HARRIS,T RN BY AMANDA LEONARD ON 01 15 2020 AT 1454, AND HAS BEEN READ BACK.     HCT 17.0 (L) 36.0 - 46.0 %   MCV 91.9 80.0 - 100.0 fL   MCH 29.7 26.0 - 34.0 pg   MCHC 32.4 30.0 - 36.0 g/dL   RDW 25.0 53.9 - 76.7 %   Platelets 309 150 - 400 K/uL   nRBC 0.1 0.0 - 0.2 %    Comment: Performed at [facility], [address] [place], [place] [address]  Type and screen [facility]     Status: None   Collection Time: 01/21/18  3:20 PM  Result Value Ref Range   ABO/RH(D) O POS    Antibody Screen NEG    Sample Expiration 01/24/2018    Unit Number [id number]    Blood Component Type RED CELLS,LR    Unit division 00    Status of Unit ISSUED,FINAL    Transfusion Status OK TO TRANSFUSE    Crossmatch Result Compatible    Unit Number [id number]    Blood Component Type RBC LR PHER1    Unit division 00    Status of Unit ISSUED,FINAL    Transfusion Status OK TO TRANSFUSE    Crossmatch Result      Compatible Performed at [facility], [address] [place], [place] [address]   Prepare RBC     Status: None   Collection Time: 01/21/18  3:20 PM  Result Value Ref Range   Order Confirmation      ORDER PROCESSED BY BLOOD BANK Performed at [facility], [address] [place], [place] [address]   CBC with Differential/Platelet     Status: Abnormal   Collection Time: 01/22/18  7:52 AM  Result Value Ref Range   WBC 10.7 (H) 4.0 - 10.5 K/uL   RBC 2.67 (L) 3.87 - 5.11 MIL/uL   Hemoglobin 7.8 (L) 12.0 - 15.0 g/dL    Comment: REPEATED TO VERIFY POST TRANSFUSION SPECIMEN    HCT 24.2 (L) 36.0 - 46.0 %   MCV 90.6 80.0 - 100.0 fL   MCH 29.2 26.0 - 34.0 pg   MCHC 32.2 30.0 - 36.0 g/dL   RDW 41.7 40.8 - 14.4 %   Platelets 297 150 - 400 K/uL   nRBC 0.3 (H) 0.0 - 0.2 %   Neutrophils Relative % 77 %   Neutro Abs 8.3 (H) 1.7 - 7.7 K/uL   Lymphocytes Relative 13 %   Lymphs Abs 1.4 0.7 - 4.0 K/uL   Monocytes Relative 7 %   Monocytes Absolute 0.7 0.1 - 1.0 K/uL   Eosinophils Relative 2 %   Eosinophils Absolute 0.2 0.0 - 0.5 K/uL   Basophils Relative 0 %   Basophils Absolute 0.0 0.0 - 0.1 K/uL   Immature Granulocytes 1 %   Abs Immature Granulocytes 0.07 0.00 - 0.07 K/uL    Comment: Performed at [facility], [address], [place], [place] [address]   Dg Chest [place]  1  View  Result Date: 01/21/2018 CLINICAL DATA:  Fever today. EXAM: PORTABLE CHEST 1 VIEW COMPARISON:  Limited correlation made with scoliosis radiographs 01/15/2018. FINDINGS: 1505 hours. The heart size and mediastinal contours are stable. There is stable asymmetric elevation of the right hemidiaphragm. There is a new ill defined nodular density at the right lung base, not seen on recent spine radiographs and therefore favored to reflect subsegmental atelectasis. A small nodule is difficult to exclude. There is no confluent airspace opacity, pleural effusion or pneumothorax. Interval thoracolumbar fusion noted, incompletely visualized. IMPRESSION: Probable focal atelectasis at the right lung base in this recently postoperative patient. No confluent airspace opacity to suggest pneumonia. Two-view chest radiographic follow-up recommended when the patient is able. Electronically Signed   By: Carey Bullocks M.D.   On: 01/21/2018 15:23       Medical Problem List and Plan: 1.  Decreased functional mobility secondary to  Lumbar and thoracic spine spinal stenosis with radiculopathy status post L5-S1 anterior longitudinal interbody fusion as well as L1-L5 X LI F1 09/27/2018, T8 through ilium decompression and posterior spinal fusion 01/19/2018  -admit to inpatient rehab 2.  DVT Prophylaxis/Anticoagulation: SCDs. Check vascular study 3. Pain Management: using oxycodone and dilaudid q2 prn on acute  -dc dilaudid  -continue Robaxin and oxycodone needed.   -add MS contin 15mg  q12 for consistent pain control  -kpad, ice 4. Mood:   Ativan 0.5 milligrams every 4 hours as needed.Provide emotional support 5. Neuropsych: This patient is capable of making decisions on her own behalf. 6. Skin/Wound Care:  Routine skin checks 7. Fluids/Electrolytes/Nutrition:  Routine in and out's with follow-up chemistries 8. Acute blood loss anemia. Follow-up CBC 9. Constipation. Laxative assistance    Post Admission Physician  Evaluation: 1. Functional deficits secondary  to lumbar stenosis with radiculopathy and neurogenic claudication, s/p decompression and fusion. 2. Patient is admitted to receive collaborative, interdisciplinary care between the physiatrist, rehab nursing staff, and therapy team. 3. Patient's level of medical complexity and substantial therapy needs in context of that medical necessity cannot be provided at a lesser intensity of care such as a SNF. 4. Patient has experienced substantial functional loss from his/her baseline which was documented above under the "Functional History" and "Functional Status" headings.  Judging by the patient's diagnosis, physical exam, and functional history, the patient has potential for functional progress which will result in measurable gains while on inpatient rehab.  These gains will be of substantial and practical use upon discharge  in facilitating mobility and self-care at the household level. 5. Physiatrist will provide 24 hour management of medical needs as well as oversight of the therapy plan/treatment and provide guidance as appropriate regarding the interaction of the two. 6. The Preadmission Screening has been reviewed and patient status is unchanged unless otherwise stated above. 7. 24 hour rehab nursing will assist with bladder management, bowel management, safety, skin/wound care, disease management, medication administration, pain management and patient education  and help integrate therapy concepts, techniques,education, etc. 8. PT will assess and treat for/with: Lower extremity strength, range of motion, stamina, balance, functional mobility, safety, adaptive techniques and equipment, NMR, pain control, LSO don/doff.   Goals are: mod I. 9. OT will assess and treat for/with: ADL's, functional mobility, safety, upper extremity strength, adaptive techniques and equipment, NMR, pain control, don/doff ofLSO.   Goals are: mod I to set up. Therapy may proceed with  showering this patient. 10. SLP will assess and treat for/with: n/a.  Goals are: n/a. 11.  Case Management and Social Worker will assess and treat for psychological issues and discharge planning. 12. Team conference will be held weekly to assess progress toward goals and to determine barriers to discharge. 13. Patient will receive at least 3 hours of therapy per day at least 5 days per week. 14. ELOS: 7 -10 days       15. Prognosis:  excellent   I have personally performed a face to face diagnostic evaluation of this patient and formulated the key components of the plan.  Additionally, I have personally reviewed laboratory data, imaging studies, as well as relevant notes and concur with the physician assistant's documentation above.  Ranelle OysterZachary T. Fortune Torosian, MD, FAAPMR    Mcarthur Rossettianiel J Angiulli, PA-C 01/23/2018

## 2018-01-23 NOTE — Progress Notes (Signed)
Inpatient Rehabilitation-Admissions Coordinator   [facility] met with pt at the bedside who still wants to pursue CIR. Noted medical approval for rehab today based on DC order placed in chart this AM. [facility] will plan to admit pt to CIR today. Per pt request, AC called her husband to discuss plans, with pt wanting husband to sign consent forms. Will meet with husband at noon for paperwork prior to transfer to CIR.   Please call if questions.   Jhonnie Garner, OTR/L  Rehab Admissions Coordinator  [phone] 01/23/2018 10:26 AM

## 2018-01-23 NOTE — Progress Notes (Addendum)
Subjective: Patient reports "I think I'm doing better, just hurting this morning"  Objective: Vital signs in last 24 hours: Temp:  [98.3 F (36.8 C)-100 F (37.8 C)] 99.9 F (37.7 C) (01/17 0400) Pulse Rate:  [87-108] 87 (01/17 0400) BP: (101-127)/(55-72) 112/72 (01/17 0400) SpO2:  [94 %-97 %] 95 % (01/17 0400)  Intake/Output from previous day: 01/16 0701 - 01/17 0700 In: 360 [P.O.:360] Out: -  Intake/Output this shift: No intake/output data recorded.  Alert, conversant. Difficulty getting into a comfortable position at present. Incisions without erythema, swelling, or drainage left abdomen, right side, and thoraco-lumbar. Belly soft, nondistended, tender only at incision. Passing gas, reporting no discomfort or bloating. Good strength all extremities.  Lab Results: Recent Labs    01/21/18 1408 01/22/18 0752  WBC 16.6* 10.7*  HGB 5.5* 7.8*  HCT 17.0* 24.2*  PLT 309 297   BMET No results for input(s): NA, K, CL, CO2, GLUCOSE, BUN, CREATININE, CALCIUM in the last 72 hours.  Studies/Results: Dg Chest Port 1 View  Result Date: 01/21/2018 CLINICAL DATA:  Fever today. EXAM: PORTABLE CHEST 1 VIEW COMPARISON:  Limited correlation made with scoliosis radiographs 01/15/2018. FINDINGS: 1505 hours. The heart size and mediastinal contours are stable. There is stable asymmetric elevation of the right hemidiaphragm. There is a new ill defined nodular density at the right lung base, not seen on recent spine radiographs and therefore favored to reflect subsegmental atelectasis. A small nodule is difficult to exclude. There is no confluent airspace opacity, pleural effusion or pneumothorax. Interval thoracolumbar fusion noted, incompletely visualized. IMPRESSION: Probable focal atelectasis at the right lung base in this recently postoperative patient. No confluent airspace opacity to suggest pneumonia. Two-view chest radiographic follow-up recommended when the patient is able. Electronically  Signed   By: Carey Bullocks M.D.   On: 01/21/2018 15:23    Assessment/Plan: Improving  LOS: 8 days  Pt and husband have agreed to proceed with CIR here. Hopeful to transfer soon. Will continue to work on pain mgt. Dilaudid helpful through the day, but decreased dosing at night proves for a painful slow start to the day.   Georgiann Cocker 01/23/2018, 7:43 AM   Transfer to Rehab when bed is available.

## 2018-01-23 NOTE — Care Management Note (Signed)
Case Management Note  Patient Details  Name: Jade Price MRN: [id number] Date of Birth: 20-Dec-1950  Subjective/Objective:   68 y.o. female admitted on 01/15/18 for elective L5-S1 anterior lumbar interbody fusion and R anterior lateral lumbar interbody fusion L1-5.  Pt underwent a second phase of surgery on 01/19/18 T8-pelvis fusion.  PTA, pt independent, lives with spouse.                Action/Plan: PT/OT recommending CIR, and consult in progress.    Expected Discharge Date:  01/23/18               Expected Discharge Plan:  IP Rehab Facility  In-House Referral:     Discharge planning Services  CM Consult  Post Acute Care Choice:  IP Rehab Choice offered to:     DME Arranged:    DME Agency:     HH Arranged:    HH Agency:     Status of Service:  Completed, signed off  If discussed at [employer] of Stay Meetings, dates discussed:    Additional Comments:  01/23/18 J. Timtohy Broski, RN, BSN Pt medically stable for dc, and has been accepted for admission to [facility] today.    Quintella Baton, RN, BSN  Trauma/Neuro ICU Case Manager [phone]

## 2018-01-23 NOTE — Progress Notes (Signed)
PMR Admission Coordinator Pre-Admission Assessment  Patient: Jade Price is an 68 y.o., female MRN: [id number] DOB: 11-Jul-1950 Height: 5\' 5"  (165.1 cm) Weight: 65.8 kg                                                                                                                                                  Insurance Information HMO:     PPO:      PCP:      IPA:      80/20:      OTHER: no HMO PRIMARY: Medicare a and b      Policy#: [id number]      Subscriber: pt Benefits:  Phone #: passport online     Name: 01/21/2018 Eff. Date: 02/07/2017     Deduct: $1408      Out of Pocket Max: none      Life Max: none CIR: 100%      SNF: 20 full days Outpatient: 80%     Co-Pay: 20% Home Health: 100%      Co-Pay: none DME: 80%     Co-Pay: 20% Providers: pt choice  SECONDARY: BCBS supplement      Policy#: [id number]      Subscriber: pt  Medicaid Application Date:       Case Manager:  Disability Application Date:       Case Worker:   Emergency Contact Information         Contact Information    Name Relation Home Work Mobile   Puello,STEVE; DR Spouse   [phone]     Current Medical History  Patient Admitting Diagnosis: Lumbar and thoracic spinal stenosis  S/p L5-S1 anterior fusion as well as L1-L5 XLIF, T8 through ilium decompression posterior fusion  History of Present Illness:Jade Price a 68 y.o.right handedfemalewith history of hyperlipidemia, chronic back pain. Presented 01/15/2018 with low back pain radiating to the lower extremities. X-rays and imaging revealed spinal stenosis and scoliosis with foraminal stenosis of lateral listhesis L3-4 stenosis with radiculopathy. Underwent L5-S1 anterior lumbar interbody fusion, L1-2, 2-3, 3-4, 4-5 anterior lateral lumbar interbody fusion 01/15/2018 per Dr. Venetia Maxon as well as second part of fusion 01/19/2018. Hospital course pain management. TLSO back brace and out of bed applied in sitting position. Hgb 5.5 on 1/16 and  transfused 2 units PRBCS. Fevers with  CXR probable focal atelectasis at the right lung base. No confluent airspace opacity to suggest PNA. UA negative. Patient encouraged to use incentive spirometer more frequently.   Past Medical History      Past Medical History:  Diagnosis Date  . Anxiety    situitional  . Arthritis   . Back pain 02/2017  . Complication of anesthesia   . Degenerative lumbar spinal stenosis   . Disc displacement, lumbar   . GERD (gastroesophageal reflux disease)   . High  cholesterol   . Hip pain 02/2017  . Leg pain    While walking  . Lumbar radiculopathy   . Osteopenia 10/01/2017  . PONV (postoperative nausea and vomiting)   . RLS (restless legs syndrome)   . Sciatic notch pain, right   . Scoliosis   . Spondylolisthesis    Lumber region  . Staph infection 2012   Finger    Family History  family history includes Alzheimer's disease in her mother; Cancer in her mother.  Prior Rehab/Hospitalizations:  Has the patient had major surgery during 100 days prior to admission? No  Current Medications   Current Facility-Administered Medications:  .  0.9 %  sodium chloride infusion, 250 mL, Intravenous, Continuous, Maeola Harman, MD .  acetaminophen (TYLENOL) tablet 650 mg, 650 mg, Oral, Q4H PRN, 650 mg at 01/22/18 1600 **OR** acetaminophen (TYLENOL) suppository 650 mg, 650 mg, Rectal, Q4H PRN, Maeola Harman, MD .  alum & mag hydroxide-simeth (MAALOX/MYLANTA) 200-200-20 MG/5ML suspension 30 mL, 30 mL, Oral, Q6H PRN, Maeola Harman, MD .  bisacodyl (DULCOLAX) suppository 10 mg, 10 mg, Rectal, Daily PRN, Maeola Harman, MD, 10 mg at 01/23/18 0933 .  bismuth subsalicylate (PEPTO BISMOL) chewable tablet 524 mg, 524 mg, Oral, PRN, Maeola Harman, MD .  [COMPLETED] 6 CHG cloth bath night before surgery, , , Once **AND** 6 CHG cloth bath AM of surgery, , , Once **AND** [COMPLETED] Chlorhexidine Gluconate Cloth 2 % PADS 6 each, 6 each, Topical, Once,  6 each at 01/19/18 0635 **AND** Chlorhexidine Gluconate Cloth 2 % PADS 6 each, 6 each, Topical, Once, Maeola Harman, MD .  dextrose 5 % and 0.45 % NaCl with KCl 20 mEq/L infusion, , Intravenous, Continuous, Maeola Harman, MD, Stopped at 01/20/18 1943 .  diazepam (VALIUM) tablet 5 mg, 5 mg, Oral, Q6H PRN, Maeola Harman, MD, 5 mg at 01/23/18 0933 .  docusate sodium (COLACE) capsule 100 mg, 100 mg, Oral, BID, Maeola Harman, MD, 100 mg at 01/23/18 0933 .  HYDROmorphone (DILAUDID) tablet 4 mg, 4 mg, Oral, Q2H PRN, Maeola Harman, MD, 4 mg at 01/23/18 0651 .  LORazepam (ATIVAN) tablet 0.5 mg, 0.5 mg, Oral, Q4H PRN, Maeola Harman, MD, 0.5 mg at 01/23/18 0933 .  menthol-cetylpyridinium (CEPACOL) lozenge 3 mg, 1 lozenge, Oral, PRN **OR** phenol (CHLORASEPTIC) mouth spray 1 spray, 1 spray, Mouth/Throat, PRN, Maeola Harman, MD .  methocarbamol (ROBAXIN) tablet 500 mg, 500 mg, Oral, Q6H PRN, 500 mg at 01/23/18 0500 **OR** methocarbamol (ROBAXIN) 500 mg in dextrose 5 % 50 mL IVPB, 500 mg, Intravenous, Q6H PRN, Maeola Harman, MD, Last Rate: 100 mL/hr at 01/19/18 2127, 500 mg at 01/19/18 2127 .  [DISCONTINUED] ondansetron (ZOFRAN) tablet 4 mg, 4 mg, Oral, Q6H PRN **OR** ondansetron (ZOFRAN) injection 4 mg, 4 mg, Intravenous, Q6H PRN, Maeola Harman, MD, 4 mg at 01/22/18 0143 .  ondansetron (ZOFRAN) tablet 4 mg, 4 mg, Oral, Q6H PRN **OR** ondansetron (ZOFRAN) injection 4 mg, 4 mg, Intravenous, Q6H PRN, Maeola Harman, MD .  oxyCODONE (Oxy IR/ROXICODONE) immediate release tablet 5-10 mg, 5-10 mg, Oral, Q3H PRN, Maeola Harman, MD, 10 mg at 01/23/18 0933 .  pantoprazole (PROTONIX) EC tablet 40 mg, 40 mg, Oral, QHS, Maeola Harman, MD, 40 mg at 01/22/18 2208 .  polyethylene glycol (MIRALAX / GLYCOLAX) packet 17 g, 17 g, Oral, Daily PRN, Maeola Harman, MD .  promethazine (PHENERGAN) injection 12.5 mg, 12.5 mg, Intravenous, Q6H PRN, Maeola Harman, MD, 12.5 mg at 01/21/18 1130 .  rOPINIRole (REQUIP) tablet 4 mg, 4 mg,  Oral, Lynder ParentsQHS,  Stern, Joseph, MD, 4 mg at 01/22/18 2208 .  sodium chloride flush (NS) 0.9 % injection 3 mL, 3 mL, Intravenous, Q12H, Maeola HarmanStern, Joseph, MD, 3 mL at 01/23/18 [phone] .  sodium chloride flush (NS) 0.9 % injection 3 mL, 3 mL, Intravenous, PRN, Maeola HarmanStern, Joseph, MD .  sodium phosphate (FLEET) 7-19 GM/118ML enema 1 enema, 1 enema, Rectal, Once PRN, Maeola HarmanStern, Joseph, MD .  zolpidem Remus Loffler(AMBIEN) tablet 5 mg, 5 mg, Oral, QHS PRN, Maeola HarmanStern, Joseph, MD, 5 mg at 01/16/18 2118  Patients Current Diet:     Diet Order                  Diet - low sodium heart healthy         Diet regular Room service appropriate? Yes; Fluid consistency: Thin  Diet effective now               Precautions / Restrictions Precautions Precautions: Fall, Back Precaution Booklet Issued: Yes (comment) Precaution Comments: handout provided previously, reviewed with patinet and required cueing to recall twisting  Spinal Brace: Thoracolumbosacral orthotic Restrictions Weight Bearing Restrictions: No   Has the patient had 2 or more falls or a fall with injury in the past year?No  Prior Activity Level Community (5-7x/wk): independent; active; drives  Journalist, newspaperHome Assistive Devices / Equipment Home Assistive Devices/Equipment: Eyeglasses, Other (Comment), Built-in shower seat Home Equipment: Shower seat - built in, MariannaWalker - 2 wheels, Hand held shower head  Prior Device Use: Indicate devices/aids used by the patient prior to current illness, exacerbation or injury? None of the above  Prior Functional Level Prior Function Level of Independence: Independent Comments: drives, retired, husband reports she has been refusing to use RW, but likely needed to PTA.  No h/o falls. Husband is a primary care/OBGYN doc  Self Care: Did the patient need help bathing, dressing, using the toilet or eating?  Independent  Indoor Mobility: Did the patient need assistance with walking from room to room (with or without device)?  Independent  Stairs: Did the patient need assistance with internal or external stairs (with or without device)? Independent  Functional Cognition: Did the patient need help planning regular tasks such as shopping or remembering to take medications? Independent  Current Functional Level Cognition  Overall Cognitive Status: Within Functional Limits for tasks assessed Current Attention Level: Sustained Orientation Level: Oriented X4 General Comments: Better today after blood products yesterday.     Extremity Assessment (includes Sensation/Coordination)  Upper Extremity Assessment: Generalized weakness  Lower Extremity Assessment: Defer to PT evaluation RLE Deficits / Details: right leg continues to have numbness and weakness, difficulty WB through the leg with signs of instability in WB.  RLE Sensation: decreased light touch, history of peripheral neuropathy    ADLs  Overall ADL's : Needs assistance/impaired Eating/Feeding: Set up, Sitting Eating/Feeding Details (indicate cue type and reason): encouragement to self feed, education of positoining Grooming: Set up, Supervision/safety, Sitting Upper Body Bathing: Set up, Sitting Lower Body Bathing: Maximal assistance, Sitting/lateral leans Lower Body Bathing Details (indicate cue type and reason): decreased reach to B feet, cannot complete figure 4 technique, pain limiting  Upper Body Dressing : Minimal assistance, Sitting Upper Body Dressing Details (indicate cue type and reason): able to don initial portion of brace but required assistance to locate and pull velcro straps Lower Body Dressing: Maximal assistance, Sitting/lateral leans Lower Body Dressing Details (indicate cue type and reason): attempted crossing legs over knees seated eob. improvement with LLE but still unable. reports  she was introduced to A/E and plans on using that for LB ADLs Toilet Transfer: Min guard, RW, Ambulation, Regular Toilet, Grab bars Toilet Transfer  Details (indicate cue type and reason): heavy reliance on grab bars for sit/stand,stand/sit Toileting- ArchitectClothing Manipulation and Hygiene: Sit to/from stand, Min guard Toileting - Clothing Manipulation Details (indicate cue type and reason): pt. able to perform front peri care in standing. cues not to "twist" to flush toilet Functional mobility during ADLs: Min guard, Rolling walker General ADL Comments: continues to be limited by pain, nausea today    Mobility  Overal bed mobility: Needs Assistance Bed Mobility: Rolling, Sidelying to Sit Rolling: Supervision Sidelying to sit: Min guard Sit to sidelying: Min assist General bed mobility comments: hob flat, exiting on R side. no use of rails. emphasis on log roll technique to maintain back precuations.  states bed at home is very high, was using a step stool. her son is bring a twin be and setting it up in their dining room and it will be lower and easier for her to use    Transfers  Overall transfer level: Needs assistance Equipment used: Rolling walker (2 wheeled) Transfers: Sit to/from Stand, Stand Pivot Transfers Sit to Stand: Min guard Stand pivot transfers: Min guard General transfer comment: Min assist to help steady pt for balance, stabilize RW during fast transition of hands.      Ambulation / Gait / Stairs / Wheelchair Mobility  Ambulation/Gait Ambulation/Gait assistance: Editor, commissioningMin assist Gait Distance (Feet): 100 Feet Assistive device: Rolling walker (2 wheeled) Gait Pattern/deviations: Step-through pattern, Decreased weight shift to right, Decreased step length - right General Gait Details: Pt needed min assist, especially as she fatigued and R leg began to drag more than initially.  Pt's knees continue to be flexed and soft as she fatigues as well, pt is heavily dependent on RW support in standing to prevent falling.  Gait velocity: decreased Gait velocity interpretation: <1.8 ft/sec, indicate of risk for recurrent falls     Posture / Balance Dynamic Sitting Balance Sitting balance - Comments: Unable to remove both hands from bed and maintain balance.  Balance Overall balance assessment: Needs assistance Sitting-balance support: Feet supported, Bilateral upper extremity supported Sitting balance-Leahy Scale: Poor Sitting balance - Comments: Unable to remove both hands from bed and maintain balance.  Standing balance support: Bilateral upper extremity supported Standing balance-Leahy Scale: Poor Standing balance comment: needs support from RW and therapist.     Special needs/care consideration BiPAP/CPAP n/a CPM : NA Continuous Drip IV n/a Dialysis: no        Days: no Life Vest: no Oxygen: no, on RA Special Bed: no Trach Size: no Wound Vac (area): no      Location: no Skin abdomen with surgical incision with honeycomb dressing; Back with surgical incisions and honey comb dressings Bowel mgmt: continent; constipation; LBM: PTA. None since.  Bladder mgmt: continent Diabetic mgmt n/a Anxious   Previous Home Environment Living Arrangements: Spouse/significant other  Lives With: Spouse Available Help at Discharge: Family, Available 24 hours/day Type of Home: House Home Layout: Multi-level, Able to live on main level with bedroom/bathroom Home Access: Stairs to enter Entrance Stairs-Rails: None Entrance Stairs-Number of Steps: 2 Bathroom Shower/Tub: Psychologist, counsellingWalk-in shower, Sport and exercise psychologistDoor Bathroom Toilet: Standard Bathroom Accessibility: Yes How Accessible: Accessible via walker Home Care Services: No  Discharge Living Setting Plans for Discharge Living Setting: Patient's home, Lives with (comment)(spouse) Type of Home at Discharge: House Discharge Home Layout: Multi-level, Able to live on main level  with bedroom/bathroom Discharge Home Access: Stairs to enter Entrance Stairs-Rails: None Entrance Stairs-Number of Steps: 2 Discharge Bathroom Shower/Tub: Walk-in shower Discharge Bathroom Toilet:  Standard Discharge Bathroom Accessibility: Yes How Accessible: Accessible via walker Does the patient have any problems obtaining your medications?: No  Social/Family/Support Systems Patient Roles: Spouse, Parent Contact Information: spouse, Dr. Caroleen Hamman Anticipated Caregiver: spouse Anticipated Caregiver's Contact Information: [phone] cell Ability/Limitations of Caregiver: none Caregiver Availability: 24/7 Discharge Plan Discussed with Primary Caregiver: Yes Is Caregiver In Agreement with Plan?: Yes Does Caregiver/Family have Issues with Lodging/Transportation while Pt is in Rehab?: No  Goals/Additional Needs Patient/Family Goal for Rehab: Mod I with PT and OT Expected length of stay: ELOS 7 to 10 days Pt/Family Agrees to Admission and willing to participate: Yes Program Orientation Provided & Reviewed with Pt/Caregiver Including Roles  & Responsibilities: Yes  Decrease burden of Care through IP rehab admission: n/a  Possible need for SNF placement upon discharge: not anticipated  Patient Condition: This patient's medical and functional status has changed since the consult dated: 01/21/2018 in which the Rehabilitation Physician determined and documented that the patient's condition is appropriate for intensive rehabilitative care in an inpatient rehabilitation facility. See "History of Present Illness" (above) for medical update. Functional changes are: improvement in transfers from Min A to Min G and improvement from 3 feet ambulation with Min A to 100 feet Min A. Pt has improved in UB dressing from Mod A to Min A and improved in LB dressing from Total A to Max A. Patient's medical and functional status update has been discussed with the Rehabilitation physician and patient remains appropriate for inpatient rehabilitation. Will admit to inpatient rehab today.  Preadmission Screen Completed By: Ottie Glazier RN MSN with updates by  Nanine Means, 01/23/2018 12:12  PM ______________________________________________________________________   Discussed status with Dr. Riley Kill on 01/23/18 at 12:11PM and received telephone approval for admission today.  Admission Coordinator: Ottie Glazier RN MSN with updates by  Nanine Means, time 12:11PM/Date 01/23/18.           Cosigned by: Ranelle Oyster, MD at 01/23/2018 12:22 PM  Revision History

## 2018-01-23 NOTE — Progress Notes (Signed)
Physical Therapy Treatment Patient Details Name: Jade Price MRN: [id number] DOB: 1950-10-18 Today's Date: 01/23/2018    History of Present Illness 68 y.o. female admitted on 01/15/18 for elective L5-S1 anterior lumbar interbody fusion and R anterior lateral lumbar interbody fusion L1-5. 2nd part of fusion on 01/19/2018 per pt. Pt with significant PMH of RLS, lumbar radiculopathy, arthritis, and anxiety.  Pt underwent a second phase of surgery on 01/19/18 T8-pelvis fusion.      PT Comments    Pt was able to continue to ambulate down the hallway with min to min guard assist overall for mobility.  She is reporting significant R LE pain during gait, but has been up most of the morning.  She needed cues for upright posture during gait as she flexes more forward putting more weight on her hands as she fatigues.  She is due to d/c to CIR later today.  PT to follow acutely until d/c confirmed.    Follow Up Recommendations  Supervision for mobility/OOB;CIR     Equipment Recommendations  Rolling walker with 5" wheels    Recommendations for Other Services   NA     Precautions / Restrictions Precautions Precautions: Fall;Back Required Braces or Orthoses: Spinal Brace Spinal Brace: Thoracolumbosacral orthotic(pt has difficulty donning without assistance)    Mobility  Bed Mobility Overal bed mobility: Needs Assistance Bed Mobility: Rolling;Sidelying to Sit Rolling: Supervision Sidelying to sit: Min assist     Sit to sidelying: Mod assist General bed mobility comments: Supervision for log roll to EOB, using bed rail for leverage, min assist to pull to sitting with left hand (from R side lying), Mod assist to help lift both feet back into bed to return to side lying.   Transfers Overall transfer level: Needs assistance Equipment used: Rolling walker (2 wheeled) Transfers: Sit to/from Stand Sit to Stand: Min guard Stand pivot transfers: Min guard       General transfer comment: Min guard  assist, verbal cues for safe hand placement (pt prefers both hands on RW during transitions up and down).   Ambulation/Gait Ambulation/Gait assistance: Min guard Gait Distance (Feet): 100 Feet Assistive device: Rolling walker (2 wheeled) Gait Pattern/deviations: Step-through pattern;Decreased step length - right;Decreased weight shift to right;Trunk flexed Gait velocity: decreased Gait velocity interpretation: <1.8 ft/sec, indicate of risk for recurrent falls General Gait Details: Pt more painful today as she was sitting up for 2 hours just before I came to work with her, so her gait pattern was much more painful.  As she fatigued cues for upright posture.            Balance Overall balance assessment: Needs assistance Sitting-balance support: Feet supported;Bilateral upper extremity supported Sitting balance-Leahy Scale: Poor Sitting balance - Comments: reliant on UE support in sitting EOB.    Standing balance support: Bilateral upper extremity supported Standing balance-Leahy Scale: Poor Standing balance comment: very reliant on UE support in standing.                             Cognition Arousal/Alertness: Awake/alert Behavior During Therapy: WFL for tasks assessed/performed Overall Cognitive Status: Impaired/Different from baseline Area of Impairment: Memory                               General Comments: Does not remeber working with OTA earlier today  Pertinent Vitals/Pain Pain Assessment: Faces Pain Score: 9  Faces Pain Scale: Hurts whole lot Pain Location: back and right leg Pain Descriptors / Indicators: Aching;Guarding;Grimacing Pain Intervention(s): Monitored during session;Limited activity within patient's tolerance;Repositioned           PT Goals (current goals can now be found in the care plan section) Acute Rehab PT Goals Patient Stated Goal: to go home and improve right leg pain Progress towards PT goals:  Progressing toward goals    Frequency    Min 5X/week      PT Plan Current plan remains appropriate       AM-PAC PT "6 Clicks" Mobility   Outcome Measure  Help needed turning from your back to your side while in a flat bed without using bedrails?: None Help needed moving from lying on your back to sitting on the side of a flat bed without using bedrails?: A Little Help needed moving to and from a bed to a chair (including a wheelchair)?: A Little Help needed standing up from a chair using your arms (e.g., wheelchair or bedside chair)?: A Little Help needed to walk in hospital room?: A Little Help needed climbing 3-5 steps with a railing? : A Lot 6 Click Score: 18    End of Session Equipment Utilized During Treatment: Back brace;Gait belt Activity Tolerance: Patient limited by pain;Patient limited by fatigue Patient left: in bed;with call bell/phone within reach;with family/visitor present   PT Visit Diagnosis: Muscle weakness (generalized) (M62.81);Difficulty in walking, not elsewhere classified (R26.2);Other symptoms and signs involving the nervous system (R29.898);Pain Pain - Right/Left: Right Pain - part of body: Leg     Time: 9211-9417 PT Time Calculation (min) (ACUTE ONLY): 23 min  Charges:  $Gait Training: 8-22 mins $Therapeutic Activity: 8-22 mins                    Jade Price, PT, DPT  Acute Rehabilitation [phone] pager #[phone] office   01/23/2018, 12:56 PM

## 2018-01-23 NOTE — Care Management Note (Signed)
[facility] Individual Statement of Services  Patient Name:  Jade Price  Date:  01/23/2018  Welcome to the [facility].  Our goal is to provide you with an individualized program based on your diagnosis and situation, designed to meet your specific needs.  With this comprehensive rehabilitation program, you will be expected to participate in at least 3 hours of rehabilitation therapies Monday-Friday, with modified therapy programming on the weekends.  Your rehabilitation program will include the following services:  Physical Therapy (PT), Occupational Therapy (OT), 24 hour per day rehabilitation nursing, Case Management (Social Worker), Rehabilitation Medicine, Nutrition Services and Pharmacy Services  Weekly team conferences will be held on Wednesday to discuss your progress.  Your Social Worker will talk with you frequently to get your input and to update you on team discussions.  Team conferences with you and your family in attendance may also be held.  Expected length of stay: 7-8 days Overall anticipated outcome: independent with device  Depending on your progress and recovery, your program may change. Your Social Worker will coordinate services and will keep you informed of any changes. Your Social Worker's name and contact numbers are listed  below.  The following services may also be recommended but are not provided by the [facility]:   Driving Evaluations  Home Health Rehabiltiation Services  Outpatient Rehabilitation Services    Arrangements will be made to provide these services after discharge if needed.  Arrangements include referral to agencies that provide these services.  Your insurance has been verified to be:  Medicare & BCBS Your primary doctor is:  Rachelle HoraRachel Mc Gee  Pertinent information will be shared with your doctor and your insurance company.  Social Worker:  Dossie DerBecky Heidemarie Goodnow, SW [phone] or ([phone])  [phone]  Information discussed with and copy given to patient by: Lucy Chrisupree, Jerlyn Pain G, 01/23/2018, 3:54 PM

## 2018-01-23 NOTE — Progress Notes (Signed)
Pt arrived to unit via bed.

## 2018-01-23 NOTE — Progress Notes (Signed)
Occupational Therapy Treatment Patient Details Name: Ameliah Upton MRN: [id number] DOB: Jan 12, 1950 Today's Date: 01/23/2018    History of present illness 68 y.o. female admitted on 01/15/18 for elective L5-S1 anterior lumbar interbody fusion and R anterior lateral lumbar interbody fusion L1-5. 2nd part of fusion on 01/19/2018 per pt. Pt with significant PMH of RLS, lumbar radiculopathy, arthritis, and anxiety.  Pt underwent a second phase of surgery on 01/19/18 T8-pelvis fusion.     OT comments  Pt. Making solid gains with skilled OT. Able to complete bed mobility, and toileting tasks.  Plans for A/E for LB ADLs.  Eager for continued therapy.    Follow Up Recommendations  Supervision/Assistance - 24 hour;CIR    Equipment Recommendations  3 in 1 bedside commode    Recommendations for Other Services Rehab consult    Precautions / Restrictions Precautions Precautions: Fall;Back Required Braces or Orthoses: Spinal Brace Spinal Brace: Thoracolumbosacral orthotic       Mobility Bed Mobility Overal bed mobility: Needs Assistance Bed Mobility: Rolling;Sidelying to Sit Rolling: Supervision Sidelying to sit: Min guard       General bed mobility comments: hob flat, exiting on R side. no use of rails. emphasis on log roll technique to maintain back precuations.  states bed at home is very high, was using a step stool. her son is bring a twin be and setting it up in their dining room and it will be lower and easier for her to use  Transfers Overall transfer level: Needs assistance Equipment used: Rolling walker (2 wheeled) Transfers: Sit to/from [employer] Sit to Stand: Min guard Stand pivot transfers: Min guard            Balance                                           ADL either performed or assessed with clinical judgement   ADL Overall ADL's : Needs assistance/impaired                 Upper Body Dressing : Minimal  assistance;Sitting Upper Body Dressing Details (indicate cue type and reason): able to don initial portion of brace but required assistance to locate and pull velcro straps Lower Body Dressing: Maximal assistance;Sitting/lateral leans Lower Body Dressing Details (indicate cue type and reason): attempted crossing legs over knees seated eob. improvement with LLE but still unable. reports she was introduced to A/E and plans on using that for LB ADLs Toilet Transfer: Min guard;RW;Ambulation;Regular Toilet;Grab bars Toilet Transfer Details (indicate cue type and reason): heavy reliance on grab bars for sit/stand,stand/sit Toileting- Clothing Manipulation and Hygiene: Sit to/from stand;Min guard Toileting - Clothing Manipulation Details (indicate cue type and reason): pt. able to perform front peri care in standing. cues not to "twist" to flush toilet     Functional mobility during ADLs: Min guard;Rolling walker       Vision       Perception     Praxis      Cognition Arousal/Alertness: Awake/alert Behavior During Therapy: WFL for tasks assessed/performed Overall Cognitive Status: Within Functional Limits for tasks assessed                                          Exercises     Shoulder Instructions  General Comments  pts. Husband is a retired Development worker, communityphysician. (obgyn).  Pt. Has 4 children and 5 grandchildren.  Interested in having her hair done properly.        Pertinent Vitals/ Pain       Pain Assessment: 0-10 Pain Score: 9  Pain Location: back Pain Descriptors / Indicators: Aching Pain Intervention(s): Monitored during session;Repositioned  Home Living                                          Prior Functioning/Environment              Frequency  Min 2X/week        Progress Toward Goals  OT Goals(current goals can now be found in the care plan section)  Progress towards OT goals: Progressing toward goals     Plan Discharge  plan remains appropriate;Frequency remains appropriate    Co-evaluation                 AM-PAC OT "6 Clicks" Daily Activity     Outcome Measure   Help from another person eating meals?: None Help from another person taking care of personal grooming?: A Little Help from another person toileting, which includes using toliet, bedpan, or urinal?: Total Help from another person bathing (including washing, rinsing, drying)?: A Lot Help from another person to put on and taking off regular upper body clothing?: A Little Help from another person to put on and taking off regular lower body clothing?: A Lot 6 Click Score: 15    End of Session Equipment Utilized During Treatment: Gait belt;Rolling walker;Back brace  OT Visit Diagnosis: Unsteadiness on feet (R26.81);Other abnormalities of gait and mobility (R26.89);Muscle weakness (generalized) (M62.81);Pain Pain - Right/Left: Right Pain - part of body: Leg;Hip   Activity Tolerance Patient tolerated treatment well   Patient Left in chair;with call bell/phone within reach   Nurse Communication          Time: [id number] OT Time Calculation (min): 19 min  Charges: OT General Charges $OT Visit: 1 Visit OT Treatments $Self Care/Home Management : 8-22 mins   Robet LeuMorris, Leon Goodnow Lorraine, COTA/L 01/23/2018, 10:07 AM

## 2018-01-23 NOTE — H&P (Signed)
Physical Medicine and Rehabilitation Admission H&P       HPI: Jade Price is a 68 year old right-handed female with history of hyperlipidemia, chronic pain. Per chart review she lives with her spouse who is a retired Development worker, community. Independent prior to admission. Multilevel home. Spouse uses a cane himself. Presented 01/15/2018 low back pain radiating to lower extremities. X-rays and imaging revealed spinal stenosis and scoliosis with foraminal stenosis of lateral listhesis L3-4 stenosis with radiculopathy. Plans were arranged for 2 part procedure and underwent L5-S1 anterior lumbar interbody fusion, L1-2, 2-3, 3-4, 4-5 anterior lateral lumbar interbody fusion 01/15/2018 per Dr. Venetia Maxon as well as second part of fusion 01/19/2018. Hospital course pain management. Acute blood loss anemia 5.5 and transfuse 2 units pack red blood cells with latest hemoglobin 7.8. TLSO back brace when out of bed applied in sitting position. Therapy evaluations completed with recommendations of physical medicine rehabilitation consult. Patient was admitted for a comprehensive rehabilitation program.   Review of Systems  Constitutional: Negative for chills and fever.  HENT: Negative for hearing loss.   Eyes: Negative for blurred vision and double vision.  Respiratory: Negative for shortness of breath.   Cardiovascular: Negative for chest pain and leg swelling.  Gastrointestinal: Positive for constipation. Negative for nausea and vomiting.       GERD  Genitourinary: Negative for dysuria, flank pain and hematuria.  Musculoskeletal: Positive for back pain and myalgias.  Skin: Negative for rash.  Psychiatric/Behavioral:       Anxiety  All other systems reviewed and are negative.       Past Medical History:  Diagnosis Date  . Anxiety      situitional  . Arthritis    . Back pain 02/2017  . Complication of anesthesia    . Degenerative lumbar spinal stenosis    . Disc displacement, lumbar    . GERD  (gastroesophageal reflux disease)    . High cholesterol    . Hip pain 02/2017  . Leg pain      While walking  . Lumbar radiculopathy    . Osteopenia 10/01/2017  . PONV (postoperative nausea and vomiting)    . RLS (restless legs syndrome)    . Sciatic notch pain, right    . Scoliosis    . Spondylolisthesis      Lumber region  . Staph infection 2012    Finger         Past Surgical History:  Procedure Laterality Date  . ABDOMINAL EXPOSURE N/A 01/15/2018    Procedure: ABDOMINAL EXPOSURE;  Surgeon: Nada Libman, MD;  Location: [facility] OR;  Service: Vascular;  Laterality: N/A;  . ANTERIOR LATERAL LUMBAR FUSION 4 LEVELS Right 01/15/2018    Procedure: Right Anterior Lateral Lumbar Interbody Fusion Lumbar One-Two, Two-Three, Three-Four, and Four-Five;  Surgeon: Maeola Harman, MD;  Location: [facility] OR;  Service: Neurosurgery;  Laterality: Right;  Right Anterior Lateral Lumbar Interbody Fusion Lumbar One-Two, Two-Three, Three-Four, and Four-Five   . ANTERIOR LUMBAR FUSION N/A 01/15/2018    Procedure: Lumbar Five to Sacral One  Anterior lumbar interbody fusion;  Surgeon: Maeola Harman, MD;  Location: [facility] OR;  Service: Neurosurgery;  Laterality: N/A;  Lumbar Five to Sacral One  Anterior lumbar interbody fusion  . APPLICATION OF INTRAOPERATIVE CT SCAN N/A 01/19/2018    Procedure: APPLICATION OF INTRAOPERATIVE CT SCAN;  Surgeon: Maeola Harman, MD;  Location: [facility] OR;  Service: Neurosurgery;  Laterality: N/A;  . COLONOSCOPY      . FINGER SURGERY Right  infection  . POSTERIOR LUMBAR FUSION 4 LEVEL N/A 01/19/2018    Procedure: Thoracic Eight to pelvis fixation with Airo;  Surgeon: Maeola Harman, MD;  Location: [facility] OR;  Service: Neurosurgery;  Laterality: N/A;         Family History  Problem Relation Age of Onset  . Cancer Mother    . Alzheimer's disease Mother      Social History:  reports that she has never smoked. She has never used smokeless tobacco. She reports previous alcohol use. She reports that she  does not use drugs. Allergies:  Allergies  Allergen Reactions  . Gabapentin Other (See Comments)      Depression within 2 days          Medications Prior to Admission  Medication Sig Dispense Refill  . bismuth subsalicylate (PEPTO BISMOL) 262 MG chewable tablet Chew 524 mg by mouth as needed for indigestion or diarrhea or loose stools.      Marland Kitchen HYDROcodone-acetaminophen (NORCO/VICODIN) 5-325 MG tablet Take 1 tablet by mouth every 6 (six) hours as needed for moderate pain.      Marland Kitchen omeprazole (PRILOSEC OTC) 20 MG tablet Take 40 mg by mouth daily.      Marland Kitchen Phenyleph-Doxylamine-DM-APAP (NYQUIL SEVERE COLD/FLU) 5-6.25-10-325 MG/15ML LIQD Take by mouth.      Marland Kitchen rOPINIRole (REQUIP) 4 MG tablet Take 4 mg by mouth at bedtime.       . naproxen sodium (ALEVE) 220 MG tablet Take 660 mg by mouth 3 (three) times daily as needed (for pain or headache).           Drug Regimen Review Drug regimen was reviewed and remains appropriate with no significant issues identified   Home: Home Living Family/patient expects to be discharged to:: Private residence Living Arrangements: Spouse/significant other Available Help at Discharge: Family, Available 24 hours/day Type of Home: House Home Access: Stairs to enter [employer] of Steps: 2 Entrance Stairs-Rails: None Home Layout: Multi-level, Able to live on main level with bedroom/bathroom Bathroom Shower/Tub: Psychologist, counselling, Door [employer] Toilet: Standard Bathroom Accessibility: Yes Home Equipment: Information systems manager - built in, Environmental consultant - 2 wheels, Hand held shower head  Lives With: Spouse   Functional History: Prior Function Level of Independence: Independent Comments: drives, retired, husband reports she has been refusing to use RW, but likely needed to PTA.  No h/o falls. Husband is a primary care/OBGYN doc   Functional Status:  Mobility: Bed Mobility Overal bed mobility: Needs Assistance Bed Mobility: Rolling, Sidelying to Sit Rolling:  Supervision Sidelying to sit: Min guard Sit to sidelying: Min assist General bed mobility comments: hob flat, exiting on R side. no use of rails. emphasis on log roll technique to maintain back precuations.  states bed at home is very high, was using a step stool. her son is bring a twin be and setting it up in their dining room and it will be lower and easier for her to use Transfers Overall transfer level: Needs assistance Equipment used: Rolling walker (2 wheeled) Transfers: Sit to/from Stand, Stand Pivot Transfers Sit to Stand: Min guard Stand pivot transfers: Min guard General transfer comment: Min assist to help steady pt for balance, stabilize RW during fast transition of hands.   Ambulation/Gait Ambulation/Gait assistance: Min assist Gait Distance (Feet): 100 Feet Assistive device: Rolling walker (2 wheeled) Gait Pattern/deviations: Step-through pattern, Decreased weight shift to right, Decreased step length - right General Gait Details: Pt needed min assist, especially as she fatigued and R leg began to drag  more than initially.  Pt's knees continue to be flexed and soft as she fatigues as well, pt is heavily dependent on RW support in standing to prevent falling.  Gait velocity: decreased Gait velocity interpretation: <1.8 ft/sec, indicate of risk for recurrent falls   ADL: ADL Overall ADL's : Needs assistance/impaired Eating/Feeding: Set up, Sitting Eating/Feeding Details (indicate cue type and reason): encouragement to self feed, education of positoining Grooming: Set up, Supervision/safety, Sitting Upper Body Bathing: Set up, Sitting Lower Body Bathing: Maximal assistance, Sitting/lateral leans Lower Body Bathing Details (indicate cue type and reason): decreased reach to B feet, cannot complete figure 4 technique, pain limiting  Upper Body Dressing : Minimal assistance, Sitting Upper Body Dressing Details (indicate cue type and reason): able to don initial portion of brace  but required assistance to locate and pull velcro straps Lower Body Dressing: Maximal assistance, Sitting/lateral leans Lower Body Dressing Details (indicate cue type and reason): attempted crossing legs over knees seated eob. improvement with LLE but still unable. reports she was introduced to A/E and plans on using that for LB ADLs Toilet Transfer: Min guard, RW, Ambulation, Regular Toilet, Grab bars Toilet Transfer Details (indicate cue type and reason): heavy reliance on grab bars for sit/stand,stand/sit Toileting- Clothing Manipulation and Hygiene: Sit to/from stand, Min guard Toileting - Clothing Manipulation Details (indicate cue type and reason): pt. able to perform front peri care in standing. cues not to "twist" to flush toilet Functional mobility during ADLs: Min guard, Rolling walker General ADL Comments: continues to be limited by pain, nausea today   Cognition: Cognition Overall Cognitive Status: Within Functional Limits for tasks assessed Orientation Level: Oriented X4 Cognition Arousal/Alertness: Awake/alert Behavior During Therapy: WFL for tasks assessed/performed Overall Cognitive Status: Within Functional Limits for tasks assessed Area of Impairment: Memory, Attention Orientation Level: Time(reports 5pm) Current Attention Level: Sustained Memory: Decreased short-term memory General Comments: Better today after blood products yesterday.    Physical Exam: Blood pressure 107/66, pulse (!) 109, temperature 99.2 F (37.3 C), temperature source Oral, resp. rate 16, height 5\' 5"  (1.651 m), weight 65.8 kg, SpO2 100 %. Physical Exam  Vitals reviewed. Constitutional: She is oriented to person, place, and time. She appears well-developed.  HENT:  Head: Normocephalic.  Eyes: Pupils are equal, round, and reactive to light.  Neck: Normal range of motion.  Cardiovascular: Normal rate and regular rhythm. Exam reveals no friction rub.  No murmur heard. Respiratory: Effort  normal. No respiratory distress. She has no wheezes. She has no rales.  GI: Soft. She exhibits no distension. There is no abdominal tenderness. There is no rebound.  Musculoskeletal:        General: No edema.     Comments: Low and mid back tender. In discomfort in chair  Neurological: She is alert and oriented to person, place, and time. No cranial nerve deficit.  Patient is alert. She is a bit anxious. Follows full commands. Provides her name, age and date of birth. UE 4-5/5 in UE's. LE: 3+ HF, KE, 4/5 ADF/PF. No focal sensory deficits. DTR's 2+ LLE and 1+ RLE.   Skin: Skin is warm. No erythema.  Back incision cdi  Psychiatric: She has a normal mood and affect. Her behavior is normal.      Lab Results Last 48 Hours        Results for orders placed or performed during the hospital encounter of 01/15/18 (from the past 48 hour(s))  Urinalysis, Routine w reflex microscopic     Status: None  Collection Time: 01/21/18 12:40 PM  Result Value Ref Range    Color, Urine YELLOW YELLOW    APPearance CLEAR CLEAR    Specific Gravity, Urine 1.017 1.005 - 1.030    pH 6.0 5.0 - 8.0    Glucose, UA NEGATIVE NEGATIVE mg/dL    Hgb urine dipstick NEGATIVE NEGATIVE    Bilirubin Urine NEGATIVE NEGATIVE    Ketones, ur NEGATIVE NEGATIVE mg/dL    Protein, ur NEGATIVE NEGATIVE mg/dL    Nitrite NEGATIVE NEGATIVE    Leukocytes, UA NEGATIVE NEGATIVE      Comment: Performed at [facility], [address] [place], [place] [address]  CBC     Status: Abnormal    Collection Time: 01/21/18  2:08 PM  Result Value Ref Range    WBC 16.6 (H) 4.0 - 10.5 K/uL    RBC 1.85 (L) 3.87 - 5.11 MIL/uL    Hemoglobin 5.5 (LL) 12.0 - 15.0 g/dL      Comment: REPEATED TO VERIFY THIS CRITICAL RESULT HAS VERIFIED AND BEEN CALLED TO [facility]ARR[facility],T RN BY AMANDA LEONARD ON 01 15 2020 AT 1454, AND HAS BEEN READ BACK.       HCT 17.0 (L) 36.0 - 46.0 %    MCV 91.9 80.0 - 100.0 fL    MCH 29.7 26.0 - 34.0 pg    MCHC 32.4 30.0 - 36.0  g/dL    RDW 96.213.3 95.211.5 - 84.115.5 %    Platelets 309 150 - 400 K/uL    nRBC 0.1 0.0 - 0.2 %      Comment: Performed at [facility], [address] [place], [place] [address]  Type and screen [facility]     Status: None    Collection Time: 01/21/18  3:20 PM  Result Value Ref Range    ABO/RH(D) O POS      Antibody Screen NEG      Sample Expiration 01/24/2018      Unit Number [id number]      Blood Component Type RED CELLS,LR      Unit division 00      Status of Unit ISSUED,FINAL      Transfusion Status OK TO TRANSFUSE      Crossmatch Result Compatible      Unit Number [id number]      Blood Component Type RBC LR PHER1      Unit division 00      Status of Unit ISSUED,FINAL      Transfusion Status OK TO TRANSFUSE      Crossmatch Result          Compatible Performed at [facility], [address] [place], [place] [address]    Prepare RBC     Status: None    Collection Time: 01/21/18  3:20 PM  Result Value Ref Range    Order Confirmation          ORDER PROCESSED BY BLOOD BANK Performed at [facility], [address] [place], [place] [address]    CBC with Differential/Platelet     Status: Abnormal    Collection Time: 01/22/18  7:52 AM  Result Value Ref Range    WBC 10.7 (H) 4.0 - 10.5 K/uL    RBC 2.67 (L) 3.87 - 5.11 MIL/uL    Hemoglobin 7.8 (L) 12.0 - 15.0 g/dL      Comment: REPEATED TO VERIFY POST TRANSFUSION SPECIMEN      HCT 24.2 (L) 36.0 - 46.0 %  MCV 90.6 80.0 - 100.0 fL    MCH 29.2 26.0 - 34.0 pg    MCHC 32.2 30.0 - 36.0 g/dL    RDW 27.6 14.7 - 09.2 %    Platelets 297 150 - 400 K/uL    nRBC 0.3 (H) 0.0 - 0.2 %    Neutrophils Relative % 77 %    Neutro Abs 8.3 (H) 1.7 - 7.7 K/uL    Lymphocytes Relative 13 %    Lymphs Abs 1.4 0.7 - 4.0 K/uL    Monocytes Relative 7 %    Monocytes Absolute 0.7 0.1 - 1.0 K/uL    Eosinophils Relative 2 %    Eosinophils Absolute 0.2 0.0 - 0.5 K/uL    Basophils Relative 0 %    Basophils  Absolute 0.0 0.0 - 0.1 K/uL    Immature Granulocytes 1 %    Abs Immature Granulocytes 0.07 0.00 - 0.07 K/uL      Comment: Performed at [facility], [address] [place], [place] [address]       Imaging Results (Last 48 hours)  Dg Chest Port 1 View   Result Date: 01/21/2018 CLINICAL DATA:  Fever today. EXAM: PORTABLE CHEST 1 VIEW COMPARISON:  Limited correlation made with scoliosis radiographs 01/15/2018. FINDINGS: 1505 hours. The heart size and mediastinal contours are stable. There is stable asymmetric elevation of the right hemidiaphragm. There is a new ill defined nodular density at the right lung base, not seen on recent spine radiographs and therefore favored to reflect subsegmental atelectasis. A small nodule is difficult to exclude. There is no confluent airspace opacity, pleural effusion or pneumothorax. Interval thoracolumbar fusion noted, incompletely visualized. IMPRESSION: Probable focal atelectasis at the right lung base in this recently postoperative patient. No confluent airspace opacity to suggest pneumonia. Two-view chest radiographic follow-up recommended when the patient is able. Electronically Signed   By: Carey Bullocks M.D.   On: 01/21/2018 15:23             Medical Problem List and Plan: 1.  Decreased functional mobility secondary to  Lumbar and thoracic spine spinal stenosis with radiculopathy status post L5-S1 anterior longitudinal interbody fusion as well as L1-L5 X LI F1 09/27/2018, T8 through ilium decompression and posterior spinal fusion 01/19/2018             -admit to inpatient rehab 2.  DVT Prophylaxis/Anticoagulation: SCDs. Check vascular study 3. Pain Management: using oxycodone and dilaudid q2 prn on acute             -dc dilaudid             -continue Robaxin and oxycodone needed.              -add MS contin 15mg  q12 for consistent pain control             -kpad, ice 4. Mood:   Ativan 0.5 milligrams every 4 hours as needed.Provide emotional  support 5. Neuropsych: This patient is capable of making decisions on her own behalf. 6. Skin/Wound Care:  Routine skin checks 7. Fluids/Electrolytes/Nutrition:  Routine in and out's with follow-up chemistries 8. Acute blood loss anemia. Follow-up CBC 9. Constipation. Laxative assistance       Post Admission Physician Evaluation: 1. Functional deficits secondary  to lumbar stenosis with radiculopathy and neurogenic claudication, s/p decompression and fusion. 2. Patient is admitted to receive collaborative, interdisciplinary care between the physiatrist, rehab nursing staff, and therapy team. 3. Patient's level of medical complexity and substantial  therapy needs in context of that medical necessity cannot be provided at a lesser intensity of care such as a SNF. 4. Patient has experienced substantial functional loss from his/her baseline which was documented above under the "Functional History" and "Functional Status" headings.  Judging by the patient's diagnosis, physical exam, and functional history, the patient has potential for functional progress which will result in measurable gains while on inpatient rehab.  These gains will be of substantial and practical use upon discharge  in facilitating mobility and self-care at the household level. 5. Physiatrist will provide 24 hour management of medical needs as well as oversight of the therapy plan/treatment and provide guidance as appropriate regarding the interaction of the two. 6. The Preadmission Screening has been reviewed and patient status is unchanged unless otherwise stated above. 7. 24 hour rehab nursing will assist with bladder management, bowel management, safety, skin/wound care, disease management, medication administration, pain management and patient education  and help integrate therapy concepts, techniques,education, etc. 8. PT will assess and treat for/with: Lower extremity strength, range of motion, stamina, balance, functional  mobility, safety, adaptive techniques and equipment, NMR, pain control, LSO don/doff.   Goals are: mod I. 9. OT will assess and treat for/with: ADL's, functional mobility, safety, upper extremity strength, adaptive techniques and equipment, NMR, pain control, don/doff ofLSO.   Goals are: mod I to set up. Therapy may proceed with showering this patient. 10. SLP will assess and treat for/with: n/a.  Goals are: n/a. 11. Case Management and Social Worker will assess and treat for psychological issues and discharge planning. 12. Team conference will be held weekly to assess progress toward goals and to determine barriers to discharge. 13. Patient will receive at least 3 hours of therapy per day at least 5 days per week. 14. ELOS: 7 -10 days       15. Prognosis:  excellent     I have personally performed a face to face diagnostic evaluation of this patient and formulated the key components of the plan.  Additionally, I have personally reviewed laboratory data, imaging studies, as well as relevant notes and concur with the physician assistant's documentation above.   Ranelle Oyster, MD, [place] [facility]     Mcarthur Rossetti Angiulli, PA-C 01/23/2018  The patient's status has not changed. The original post admission physician evaluation remains appropriate, and any changes from the pre-admission screening or documentation from the acute chart are noted above.  Ranelle Oyster, MD 01/23/2018

## 2018-01-23 NOTE — Progress Notes (Signed)
Erick Colace, MD  Physician  Physical Medicine and Rehabilitation  Consult Note  Signed  Date of Service:  01/20/2018 2:16 PM       Related encounter: Admission (Discharged) from 01/15/2018 in [place] [facility]      Signed      Expand All Collapse All         Physical Medicine and Rehabilitation Consult Reason for Consult:  Decreased functional mobility Referring Physician:  Dr. Venetia Maxon   HPI: Jade Price is a 68 y.o.right handed female with history of hyperlipidemia, chronic back pain. Per chart review patient lives with spouse. Independent prior to admission. Multilevel home with bedroom on main level. Spouse uses a cane.Presented 01/15/2018 with low back pain radiating to the lower extremities. X-rays and imaging revealed spinal stenosis and scoliosis with foraminal stenosis of lateral listhesis L3-4 stenosis with radiculopathy. Underwent L5-S1 anterior lumbar interbody fusion, L1-2, 2-3, 3-4, 4-5 anterior lateral lumbar interbody fusion 01/15/2018 per Dr. Venetia Maxon as well as second part of fusion 01/19/2018. Hospital course pain management. TLSO back brace and out of bed applied in sitting position. Therapy evaluations completed with recommendations of physical medicine rehabilitation consult.   Review of Systems  Constitutional: Negative for chills and fever.  HENT: Negative for hearing loss.   Eyes: Negative for blurred vision and double vision.  Respiratory: Negative for cough and shortness of breath.   Cardiovascular: Negative for chest pain, palpitations and leg swelling.  Gastrointestinal: Positive for constipation. Negative for nausea.       GERD  Genitourinary: Negative for dysuria, flank pain and hematuria.  Musculoskeletal: Positive for back pain and myalgias.  Skin: Negative for rash.  Neurological: Positive for sensory change.  Psychiatric/Behavioral:       Anxiety  All other systems reviewed and are negative.      Past  Medical History:  Diagnosis Date  . Anxiety    situitional  . Arthritis   . Back pain 02/2017  . Complication of anesthesia   . Degenerative lumbar spinal stenosis   . Disc displacement, lumbar   . GERD (gastroesophageal reflux disease)   . High cholesterol   . Hip pain 02/2017  . Leg pain    While walking  . Lumbar radiculopathy   . Osteopenia 10/01/2017  . PONV (postoperative nausea and vomiting)   . RLS (restless legs syndrome)   . Sciatic notch pain, right   . Scoliosis   . Spondylolisthesis    Lumber region  . Staph infection 2012   Finger        Past Surgical History:  Procedure Laterality Date  . COLONOSCOPY    . FINGER SURGERY Right    infection        Family History  Problem Relation Age of Onset  . Cancer Mother   . Alzheimer's disease Mother    Social History:  reports that she has never smoked. She has never used smokeless tobacco. She reports previous alcohol use. She reports that she does not use drugs. Allergies:       Allergies  Allergen Reactions  . Gabapentin Other (See Comments)    Depression within 2 days         Medications Prior to Admission  Medication Sig Dispense Refill  . bismuth subsalicylate (PEPTO BISMOL) 262 MG chewable tablet Chew 524 mg by mouth as needed for indigestion or diarrhea or loose stools.    Marland Kitchen HYDROcodone-acetaminophen (NORCO/VICODIN) 5-325 MG tablet Take 1 tablet by mouth every 6 (  six) hours as needed for moderate pain.    Marland Kitchen omeprazole (PRILOSEC OTC) 20 MG tablet Take 40 mg by mouth daily.    Marland Kitchen Phenyleph-Doxylamine-DM-APAP (NYQUIL SEVERE COLD/FLU) 5-6.25-10-325 MG/15ML LIQD Take by mouth.    Marland Kitchen rOPINIRole (REQUIP) 4 MG tablet Take 4 mg by mouth at bedtime.     . naproxen sodium (ALEVE) 220 MG tablet Take 660 mg by mouth 3 (three) times daily as needed (for pain or headache).       Home: Home Living Family/patient expects to be discharged to:: Private residence Living  Arrangements: Spouse/significant other Available Help at Discharge: Family, Available 24 hours/day Type of Home: House Home Access: Stairs to enter [employer] of Steps: 2 Entrance Stairs-Rails: None Home Layout: Multi-level, Able to live on main level with bedroom/bathroom Bathroom Shower/Tub: Psychologist, counselling, Door [employer] Toilet: Standard Home Equipment: Information systems manager - built in, Environmental consultant - 2 wheels, Hand held shower head  Functional History: Prior Function Level of Independence: Independent Comments: drives, retired, husband reports she has been refusing to use RW, but likely needed to PTA.  No h/o falls. Husband is a primary care/OBGYN doc Functional Status:  Mobility: Bed Mobility Overal bed mobility: Needs Assistance Bed Mobility: Rolling, Sidelying to Sit Rolling: Min guard Sidelying to sit: Min guard Sit to sidelying: Min assist General bed mobility comments: OOB in recliner upon entry Transfers Overall transfer level: Needs assistance Equipment used: Rolling walker (2 wheeled) Transfers: Sit to/from Stand, [employer] Transfers Sit to Stand: Min assist Stand pivot transfers: Min assist General transfer comment: deferred due to pain, per PT min assist for transfers Ambulation/Gait Ambulation/Gait assistance: Min assist Gait Distance (Feet): 3 Feet Assistive device: Rolling walker (2 wheeled) Gait Pattern/deviations: Step-to pattern, Antalgic General Gait Details: Pt with painful WB through her right leg, Knee buckling noted in stance phase.   Gait velocity: decreased  ADL: ADL Overall ADL's : Needs assistance/impaired Eating/Feeding: Set up, Sitting Eating/Feeding Details (indicate cue type and reason): encouragement to self feed, education of positoining Grooming: Set up, Supervision/safety, Sitting Upper Body Bathing: Set up, Sitting Lower Body Bathing: Maximal assistance, Sitting/lateral leans Lower Body Bathing Details (indicate cue type and reason):  decreased reach to B feet, cannot complete figure 4 technique, pain limiting  Upper Body Dressing : Moderate assistance, Sitting Upper Body Dressing Details (indicate cue type and reason): decreased unsupported sitting balance and tolerance Lower Body Dressing: Total assistance, Sit to/from stand Lower Body Dressing Details (indicate cue type and reason): min A sit<>stand; cannot cross her legs to get to her feet Toilet Transfer: Minimal assistance, RW Toilet Transfer Details (indicate cue type and reason): pt unable d/t pain, PT reports min assist  Toileting- Clothing Manipulation and Hygiene: Moderate assistance Toileting - Clothing Manipulation Details (indicate cue type and reason): min A sit<>stand General ADL Comments: limited by increased pain, reliant on B UE support or trunk support seated; unable to complete figure 4 technique; encouargement required to engage in self care tasks   Cognition: Cognition Overall Cognitive Status: Impaired/Different from baseline Orientation Level: Oriented X4 Cognition Arousal/Alertness: Awake/alert Behavior During Therapy: Impulsive, Restless, Anxious(pain ) Overall Cognitive Status: Impaired/Different from baseline Area of Impairment: Memory, Attention, Orientation Orientation Level: Time(reports 5pm) Current Attention Level: Sustained Memory: Decreased short-term memory General Comments: patient reports increased difficulty recalling since surgery; short blessed test completed: 9/28 (questionable impairment with memory, attention, sequencing)   Blood pressure 128/83, pulse (!) 107, temperature 99.4 F (37.4 C), temperature source Axillary, resp. rate 16, height 5\' 5"  (  1.651 m), weight 65.8 kg, SpO2 100 %. Physical Exam  Constitutional: She is oriented to person, place, and time. She appears well-developed.  Lips are pale, conjunctiva pale  HENT:  Head: Normocephalic and atraumatic.  Eyes: Pupils are equal, round, and reactive to light. EOM  are normal. No scleral icterus.  Neck: No JVD present.  Cardiovascular: Normal rate and regular rhythm.  No murmur heard. Respiratory: Effort normal and breath sounds normal. No stridor. No respiratory distress. She has no wheezes.  GI: Soft. Bowel sounds are normal. She exhibits no distension. There is no abdominal tenderness.  Musculoskeletal:        General: Tenderness present. No edema.  Neurological: She is alert and oriented to person, place, and time.  Patient is alert and very anxious. She does provide her name and age. Exam limited by patient's pain and tolerance.  Motor strength is 4+ bilateral deltoid bicep tricep grip 4- bilateral hip flexor knee extensor ankle dorsiflexor Sensation intact light touch bilateral upper lower limb   Skin: Skin is warm and dry. She is not diaphoretic.  Psychiatric: She has a normal mood and affect.    LabResultsLast24Hours  No results found for this or any previous visit (from the past 24 hour(s)).   ImagingResults(Last48hours)  No results found.     Assessment/Plan: Diagnosis: Lumbar and thoracic spine spinal stenosis is post L5-S1 anterior longitudinal interbody fusion as well as L1-L5 XLIF 01/15/2018, T8 through ilium decompression posterior spinal fusion 01/19/2018 1. Does the need for close, 24 hr/day medical supervision in concert with the patient's rehab needs make it unreasonable for this patient to be served in a less intensive setting? Yes 2. Co-Morbidities requiring supervision/potential complications: Low-grade fever, acute on chronic pain 3. Due to bladder management, bowel management, safety, skin/wound care, disease management, medication administration, pain management and patient education, does the patient require 24 hr/day rehab nursing? Yes 4. Does the patient require coordinated care of a physician, rehab nurse, PT (1-2 hrs/day, 5 days/week) and OT (1-2 hrs/day, 5 days/week) to address physical and functional  deficits in the context of the above medical diagnosis(es)? Yes Addressing deficits in the following areas: balance, endurance, locomotion, strength, transferring, bowel/bladder control, bathing, dressing, feeding, grooming, toileting, cognition and psychosocial support 5. Can the patient actively participate in an intensive therapy program of at least 3 hrs of therapy per day at least 5 days per week? Yes 6. The potential for patient to make measurable gains while on inpatient rehab is good 7. Anticipated functional outcomes upon discharge from inpatient rehab are modified independent  with PT, supervision and min assist with OT, n/a with SLP. 8. Estimated rehab length of stay to reach the above functional goals is: 7 to 10 days 9. Anticipated D/C setting: Home 10. Anticipated post D/C treatments: HH therapy 11. Overall Rehab/Functional Prognosis: excellent  RECOMMENDATIONS: This patient's condition is appropriate for continued rehabilitative care in the following setting: CIR Patient has agreed to participate in recommended program. Potentially Note that insurance prior authorization may be required for reimbursement for recommended care.  Comment: Patient not ready yet, needs additional work-up of low-grade temps, needs to be able to tolerate therapy, keep patient up in chair 3 hours/day may be 90 minutes twice a day "I have personally performed a face to face diagnostic evaluation of this patient.  Additionally, I have reviewed and concur with the physician assistant's documentation above."  Erick Colace M.D. [facility] FAAPM&R (Sports Med, Neuromuscular Med) Diplomate Am Board  of Electrodiagnostic Med  Lynnae PrudeDaniel J Angiulli, PA-C 01/20/2018        Revision History                        Routing History

## 2018-01-24 ENCOUNTER — Inpatient Hospital Stay (HOSPITAL_COMMUNITY): Payer: Medicare Other

## 2018-01-24 ENCOUNTER — Other Ambulatory Visit: Payer: Self-pay

## 2018-01-24 ENCOUNTER — Inpatient Hospital Stay (HOSPITAL_COMMUNITY): Payer: Medicare Other | Admitting: Physical Therapy

## 2018-01-24 DIAGNOSIS — D62 Acute posthemorrhagic anemia: Secondary | ICD-10-CM

## 2018-01-24 DIAGNOSIS — M5416 Radiculopathy, lumbar region: Secondary | ICD-10-CM

## 2018-01-24 DIAGNOSIS — E876 Hypokalemia: Secondary | ICD-10-CM

## 2018-01-24 DIAGNOSIS — M7989 Other specified soft tissue disorders: Secondary | ICD-10-CM

## 2018-01-24 LAB — CBC WITH DIFFERENTIAL/PLATELET
Abs Immature Granulocytes: 0.14 10*3/uL — ABNORMAL HIGH (ref 0.00–0.07)
Basophils Absolute: 0 10*3/uL (ref 0.0–0.1)
Basophils Relative: 0 %
Eosinophils Absolute: 0.3 10*3/uL (ref 0.0–0.5)
Eosinophils Relative: 4 %
HCT: 26.2 % — ABNORMAL LOW (ref 36.0–46.0)
Hemoglobin: 8.1 g/dL — ABNORMAL LOW (ref 12.0–15.0)
Immature Granulocytes: 2 %
Lymphocytes Relative: 18 %
Lymphs Abs: 1.4 10*3/uL (ref 0.7–4.0)
MCH: 28 pg (ref 26.0–34.0)
MCHC: 30.9 g/dL (ref 30.0–36.0)
MCV: 90.7 fL (ref 80.0–100.0)
Monocytes Absolute: 0.7 10*3/uL (ref 0.1–1.0)
Monocytes Relative: 9 %
Neutro Abs: 5.2 10*3/uL (ref 1.7–7.7)
Neutrophils Relative %: 67 %
Platelets: 383 10*3/uL (ref 150–400)
RBC: 2.89 MIL/uL — ABNORMAL LOW (ref 3.87–5.11)
RDW: 14 % (ref 11.5–15.5)
WBC: 7.7 10*3/uL (ref 4.0–10.5)
nRBC: 0.3 % — ABNORMAL HIGH (ref 0.0–0.2)

## 2018-01-24 LAB — COMPREHENSIVE METABOLIC PANEL
ALT: 25 U/L (ref 0–44)
AST: 30 U/L (ref 15–41)
Albumin: 2.3 g/dL — ABNORMAL LOW (ref 3.5–5.0)
Alkaline Phosphatase: 42 U/L (ref 38–126)
Anion gap: 11 (ref 5–15)
BUN: 6 mg/dL — ABNORMAL LOW (ref 8–23)
CO2: 27 mmol/L (ref 22–32)
Calcium: 7.9 mg/dL — ABNORMAL LOW (ref 8.9–10.3)
Chloride: 100 mmol/L (ref 98–111)
Creatinine, Ser: 0.52 mg/dL (ref 0.44–1.00)
GFR calc Af Amer: >60 mL/min (ref >60)
GFR calc non Af Amer: >60 mL/min (ref >60)
Glucose, Bld: 119 mg/dL — ABNORMAL HIGH (ref 70–99)
Potassium: 3.1 mmol/L — ABNORMAL LOW (ref 3.5–5.1)
Sodium: 138 mmol/L (ref 135–145)
Total Bilirubin: 0.7 mg/dL (ref 0.3–1.2)
Total Protein: 5.4 g/dL — ABNORMAL LOW (ref 6.5–8.1)

## 2018-01-24 MED ORDER — FERROUS SULFATE 325 (65 FE) MG PO TABS
325.0000 mg | ORAL_TABLET | Freq: Two times a day (BID) | ORAL | Status: DC
  Administered 2018-01-24 – 2018-01-30 (×13): 325 mg via ORAL
  Filled 2018-01-24 (×13): qty 1

## 2018-01-24 MED ORDER — POTASSIUM CHLORIDE CRYS ER 20 MEQ PO TBCR
20.0000 meq | EXTENDED_RELEASE_TABLET | Freq: Every day | ORAL | Status: DC
  Administered 2018-01-24 – 2018-01-30 (×7): 20 meq via ORAL
  Filled 2018-01-24 (×7): qty 1

## 2018-01-24 MED ORDER — BOOST / RESOURCE BREEZE PO LIQD CUSTOM
1.0000 | Freq: Two times a day (BID) | ORAL | Status: DC
  Administered 2018-01-24 – 2018-01-30 (×12): 1 via ORAL

## 2018-01-24 NOTE — Progress Notes (Addendum)
Bilateral lower extremity venous duplex completed - Preliminary results - Negative for DVT. Probable left Baker's cyst. Full results in Chart review CV Proc Toma Deiters, RVS 01/24/2018 9:21 AM

## 2018-01-24 NOTE — Progress Notes (Signed)
Nutrition Brief Note  Patient identified on the Malnutrition Screening Tool (MST) Report.  68 year old female who PM significant for HLD, chronic pain. Pt presented with spinal stenosis and scoliosis and underwent several fusion surgeries.  Spoke with pt at bedside who reports appetite is good and has been improving. Pt reports eating 100% of juice, 100% of blueberry muffin, 100% of fruit, and 50% of eggs this morning for breakfast.  Pt reports that her UBW is 160 lbs and that she lost some weight last summer due to poor appetite related to pain. Current weight is 154.54 lbs. Weight up 9 lbs over the last month.  Pt reports that she does not like Ensure Enlive and would like to receive [employer] as she has had it before and enjoys it. RD to order per pt request.  Wt Readings from Last 15 Encounters:  01/23/18 70.1 kg  01/15/18 65.8 kg  01/05/18 67.4 kg  12/29/17 66.2 kg    Body mass index is 25.72 kg/m. Patient meets criteria for overweight based on current BMI.   Current diet order is Regular, patient is consuming approximately 75% of meals at this time. Labs and medications reviewed.   No nutrition interventions warranted at this time. Pt asked to receive Boost Breeze oral nutrition supplements due to preference. RD to order. If nutrition issues arise, please consult RD.    Earma Reading, MS, RD, LDN Inpatient Clinical Dietitian Pager: [phone] Weekend/After Hours: [phone]

## 2018-01-24 NOTE — Progress Notes (Signed)
[facility] PHYSICAL MEDICINE & REHABILITATION PROGRESS NOTE   Subjective/Complaints: Had a fair night. Still struggling with pain. No worse than on other floor. Ready to get started with therapies today.  ROS: Patient denies fever, rash, sore throat, blurred vision, nausea, vomiting, diarrhea, cough, shortness of breath or chest pain, joint or back pain, headache, or mood change.    Objective:   Vas [place] Lower Extremity Venous (dvt)  Result Date: 01/24/2018  Lower Venous Study Indications: Edema.  Performing Technologist: Toma Deiters RVS  Examination Guidelines: A complete evaluation includes B-mode imaging, spectral Doppler, color Doppler, and power Doppler as needed of all accessible portions of each vessel. Bilateral testing is considered an integral part of a complete examination. Limited examinations for reoccurring indications may be performed as noted.  Right Venous Findings: +---------+---------------+---------+-----------+----------+-------+          CompressibilityPhasicitySpontaneityPropertiesSummary +---------+---------------+---------+-----------+----------+-------+ CFV      Full           Yes      Yes                          +---------+---------------+---------+-----------+----------+-------+ SFJ      Full                                                 +---------+---------------+---------+-----------+----------+-------+ FV Prox  Full           Yes      Yes                          +---------+---------------+---------+-----------+----------+-------+ FV Mid   Full                                                 +---------+---------------+---------+-----------+----------+-------+ FV DistalFull           Yes      Yes                          +---------+---------------+---------+-----------+----------+-------+ PFV      Full           Yes      Yes                          +---------+---------------+---------+-----------+----------+-------+  POP      Full           Yes      Yes                          +---------+---------------+---------+-----------+----------+-------+ PTV      Full                                                 +---------+---------------+---------+-----------+----------+-------+ PERO     Full                                                 +---------+---------------+---------+-----------+----------+-------+  Left Venous Findings: +---------+---------------+---------+-----------+----------+-------+          CompressibilityPhasicitySpontaneityPropertiesSummary +---------+---------------+---------+-----------+----------+-------+ CFV      Full           Yes      Yes                          +---------+---------------+---------+-----------+----------+-------+ SFJ      Full                                                 +---------+---------------+---------+-----------+----------+-------+ FV Prox  Full           Yes      Yes                          +---------+---------------+---------+-----------+----------+-------+ FV Mid   Full                                                 +---------+---------------+---------+-----------+----------+-------+ FV DistalFull           Yes      Yes                          +---------+---------------+---------+-----------+----------+-------+ PFV      Full           Yes      Yes                          +---------+---------------+---------+-----------+----------+-------+ POP      Full           Yes      Yes                          +---------+---------------+---------+-----------+----------+-------+ PTV      Full                                                 +---------+---------------+---------+-----------+----------+-------+ PERO     Full                                                 +---------+---------------+---------+-----------+----------+-------+ There is an area of mixed echoes in the popliteal fossa  measuring 3.78 cm X 1.30 cm consistent with a possible Baker's cyst    Summary: Right: There is no evidence of deep vein thrombosis in the lower extremity. No cystic structure found in the popliteal fossa. Left: There is no evidence of deep vein thrombosis in the lower extremity. A cystic structure is found in the popliteal fossa.  *See table(s) above for measurements and observations.    Preliminary    Recent Labs    01/22/18 0752 01/24/18 0620  WBC 10.7* 7.7  HGB 7.8* 8.1*  HCT 24.2* 26.2*  PLT 297 383   Recent Labs    01/24/18 0620  NA  138  K 3.1*  CL 100  CO2 27  GLUCOSE 119*  BUN 6*  CREATININE 0.52  CALCIUM 7.9*    Intake/Output Summary (Last 24 hours) at 01/24/2018 1019 Last data filed at 01/24/2018 1002 Gross per 24 hour  Intake 240 ml  Output -  Net 240 ml     Physical Exam: Vital Signs Blood pressure 128/72, pulse (!) 103, temperature 99.9 F (37.7 C), resp. rate 16, weight 70.1 kg, SpO2 96 %. Constitutional: appears slightly uncomfortable . Vital signs reviewed. HEENT: EOMI, oral membranes moist Neck: supple Cardiovascular: RRR without murmur. No JVD    Respiratory: CTA Bilaterally without wheezes or rales. Normal effort    GI: BS +, non-tender, non-distended  Musculoskeletal:  General: No edema.  Comments: Low and mid back tender. In discomfort in chair Neurological: She is alertand oriented to person, place, and time. Nocranial nerve deficit. Patient is alert. She is a bit anxious. Follows full commands. Provides her name, age and date of birth. UE 4-5/5 in UE's. LE: 3+ HF, KE, 4/5 ADF/PF---motor exam unchanged. No focal sensory deficits. DTR's 2+ LLE and 1+ RLE---stable Skin: Skin iswarm. Noerythema. Back incision remains cdi Psychiatric: She has a normal mood and affect. Herbehavior is normal.    Assessment/Plan: 1. Functional deficits secondary to lumbar stenosis with radiculopathy/neurogenic claudication which require 3+ hours  per day of interdisciplinary therapy in a comprehensive inpatient rehab setting.  Physiatrist is providing close team supervision and 24 hour management of active medical problems listed below.  Physiatrist and rehab team continue to assess barriers to discharge/monitor patient progress toward functional and medical goals  Care Tool:  Bathing              Bathing assist       Upper Body Dressing/Undressing Upper body dressing   What is the patient wearing?: Hospital gown only    Upper body assist Assist Level: Minimal Assistance - Patient > 75%    Lower Body Dressing/Undressing Lower body dressing      What is the patient wearing?: Hospital gown only     Lower body assist Assist for lower body dressing: Minimal Assistance - Patient > 75%     Toileting Toileting    Toileting assist Assist for toileting: Minimal Assistance - Patient > 75%     Transfers Chair/bed transfer  Transfers assist     Chair/bed transfer assist level: Minimal Assistance - Patient > 75%     Locomotion Ambulation   Ambulation assist      Assist level: Minimal Assistance - Patient > 75% Assistive device: Walker-rolling Max distance: 25   Walk 10 feet activity   Assist     Assist level: Minimal Assistance - Patient > 75% Assistive device: Walker-rolling   Walk 50 feet activity   Assist Walk 50 feet with 2 turns activity did not occur: Safety/medical concerns         Walk 150 feet activity   Assist Walk 150 feet activity did not occur: Safety/medical concerns         Walk 10 feet on uneven surface  activity   Assist Walk 10 feet on uneven surfaces activity did not occur: Safety/medical concerns         Wheelchair     Assist               Wheelchair 50 feet with 2 turns activity    Assist            Wheelchair 150 feet  activity     Assist           Medical Problem List and Plan: 1.Decreased functional  mobilitysecondary to Lumbar and thoracic spine spinal stenosis with radiculopathy status post L5-S1 anterior longitudinal interbody fusion as well as L1-L5 X LI F1 09/27/2018, T8 through ilium decompression and posterior spinal fusion 01/19/2018 -beginning therapies today 2. DVT Prophylaxis/Anticoagulation: SCDs. Check vascular study 3. Pain Management:using oxycodone and dilaudid q2 prn on acute -now off dilaudid -continue Robaxin and oxycodone needed. -added MS contin 15mg  q12 for consistent pain control -kpad, ice  -described options/plan with patient, she understands 4. Mood:Ativan 0.5 milligrams every 4 hours as needed.Provide emotional support 5. Neuropsych: This patientiscapable of making decisions on herown behalf. 6. Skin/Wound Care:Routine skin checks, local care to back 7. Fluids/Electrolytes/Nutrition:encourage PO  -I personally reviewed the patient's labs today.    -potassium 3.1---begin supplement 20meq daily 8. Acute blood loss anemia. hgb 8.1 1/18 (up from 7.8)  -Fe++ supplement  -recheck next week 9. Constipation. Laxative assistance    LOS: 1 days A FACE TO FACE EVALUATION WAS PERFORMED  Ranelle OysterZachary T Cortni Tays 01/24/2018, 10:19 AM

## 2018-01-24 NOTE — Plan of Care (Signed)
  Problem: Consults Goal: RH GENERAL PATIENT EDUCATION Description See Patient Education module for education specifics. Outcome: Progressing   Problem: RH BOWEL ELIMINATION Goal: RH STG MANAGE BOWEL WITH ASSISTANCE Description STG Manage Bowel with min  Assistance.  Outcome: Progressing Goal: RH STG MANAGE BOWEL W/MEDICATION W/ASSISTANCE Description STG Manage Bowel with Medication with min Assistance.  Outcome: Progressing   Problem: RH SKIN INTEGRITY Goal: RH STG MAINTAIN SKIN INTEGRITY WITH ASSISTANCE Description STG Maintain Skin Integrity With min  Assistance.  Outcome: Progressing   Problem: RH SAFETY Goal: RH STG ADHERE TO SAFETY PRECAUTIONS W/ASSISTANCE/DEVICE Description STG Adhere to Safety Precautions With min Assistance/Device.  Outcome: Progressing   Problem: RH PAIN MANAGEMENT Goal: RH STG PAIN MANAGED AT OR BELOW PT'S PAIN GOAL Description Pt will report pain of less than 3 out of 10 with use of PRN pain medicaitons  Outcome: Progressing   Problem: RH KNOWLEDGE DEFICIT GENERAL Goal: RH STG INCREASE KNOWLEDGE OF SELF CARE AFTER HOSPITALIZATION Description Pt will be able to verbalize when to use soft lumbar corsett, be able to apply corsett without assistance.  Adhere to proper transfer techniques without cuing. Manage and maintain adequate pain control to perform daily activities.    Outcome: Progressing

## 2018-01-24 NOTE — Progress Notes (Signed)
Occupational Therapy Session Note  Patient Details  Name: Jade Price MRN: [id number] Date of Birth: 05-03-1950  Today's Date: 01/24/2018 OT Individual Time: 7494-4967 OT Individual Time Calculation (min): 54 min    Short Term Goals: Week 1:  OT Short Term Goal 1 (Week 1): n/a d/t ELOS  Skilled Therapeutic Interventions/Progress Updates:  1;1. Pt received in bed. Pt reporting would like to receive pain medication at 1530 as scheduled and RN alerted accordingly. Pt completes seated UB theres with 2# dowel rod 2x15 shoulder flex/ext, horizontal ab/adduct, press, chest press, circles and elbow flex/ext with demo cueing. Pt propels w/c aroud gift shop and back to room with S and VC for tight steering pattern around desplays for BUE endurance and strenghtening during favorite shopping leisure activity. Exited sessionw iht pt seate din bed, call light in reach and exit alarm on  Therapy Documentation Precautions:  Precautions Precautions: Fall, Back Precaution Booklet Issued: Yes (comment) Precaution Comments: handout provided previously, reviewed with patinet and required cueing to recall twisting  Required Braces or Orthoses: Spinal Brace Spinal Brace: Thoracolumbosacral orthotic Restrictions Weight Bearing Restrictions: No General:   Vital Signs: Therapy Vitals Temp: 99.3 F (37.4 C) Temp Source: Oral Pulse Rate: (!) 109 Resp: 18 BP: (!) 141/68 Patient Position (if appropriate): Lying Oxygen Therapy O2 Device: Room Air Pain: Pain Assessment Pain Score: 8  ADL: ADL Grooming: Setup Where Assessed-Grooming: Sitting at sink Upper Body Bathing: Setup Where Assessed-Upper Body Bathing: Shower Lower Body Bathing: Moderate assistance Where Assessed-Lower Body Bathing: Shower Upper Body Dressing: Setup Where Assessed-Upper Body Dressing: Edge of bed Lower Body Dressing: Maximal assistance Where Assessed-Lower Body Dressing: Edge of bed Toileting: Minimal assistance Toilet  Transfer: Minimal assistance Toilet Transfer Method: Event organiser: Minimal assistance Film/video editor Method: Designer, industrial/product: Information systems manager with back Vision   Perception    Praxis Praxis: Intact Exercises:   Other Treatments:     Therapy/Group: Individual Therapy  Shon Hale 01/24/2018, 4:06 PM

## 2018-01-24 NOTE — Evaluation (Signed)
Occupational Therapy Assessment and Plan  Patient Details  Name: Jade Price MRN: [id number] Date of Birth: April 23, 1950  OT Diagnosis: abnormal posture, acute pain, lumbago (low back pain) and muscle weakness (generalized) Rehab Potential:   ELOS: 7-10   Today's Date: 01/24/2018 OT Individual Time: 1045-1200 OT Individual Time Calculation (min): 75 min     Problem List:  Patient Active Problem List   Diagnosis Date Noted  . Radiculopathy 01/23/2018  . Status post surgery 01/15/2018  . Idiopathic scoliosis of thoracolumbar region 01/15/2018    Past Medical History:  Past Medical History:  Diagnosis Date  . Anxiety    situitional  . Arthritis   . Back pain 02/2017  . Complication of anesthesia   . Degenerative lumbar spinal stenosis   . Disc displacement, lumbar   . GERD (gastroesophageal reflux disease)   . High cholesterol   . Hip pain 02/2017  . Leg pain    While walking  . Lumbar radiculopathy   . Osteopenia 10/01/2017  . PONV (postoperative nausea and vomiting)   . RLS (restless legs syndrome)   . Sciatic notch pain, right   . Scoliosis   . Spondylolisthesis    Lumber region  . Staph infection 2012   Finger   Past Surgical History:  Past Surgical History:  Procedure Laterality Date  . ABDOMINAL EXPOSURE N/A 01/15/2018   Procedure: ABDOMINAL EXPOSURE;  Surgeon: Serafina Mitchell, MD;  Location: [place];  Service: Vascular;  Laterality: N/A;  . ANTERIOR LATERAL LUMBAR FUSION 4 LEVELS Right 01/15/2018   Procedure: Right Anterior Lateral Lumbar Interbody Fusion Lumbar One-Two, Two-Three, Three-Four, and Four-Five;  Surgeon: Erline Levine, MD;  Location: [place];  Service: Neurosurgery;  Laterality: Right;  Right Anterior Lateral Lumbar Interbody Fusion Lumbar One-Two, Two-Three, Three-Four, and Four-Five   . ANTERIOR LUMBAR FUSION N/A 01/15/2018   Procedure: Lumbar Five to Sacral One  Anterior lumbar interbody fusion;  Surgeon: Erline Levine, MD;  Location: [place];  Service:  Neurosurgery;  Laterality: N/A;  Lumbar Five to Sacral One  Anterior lumbar interbody fusion  . APPLICATION OF INTRAOPERATIVE CT SCAN N/A 01/19/2018   Procedure: APPLICATION OF INTRAOPERATIVE CT SCAN;  Surgeon: Erline Levine, MD;  Location: [place];  Service: Neurosurgery;  Laterality: N/A;  . COLONOSCOPY    . FINGER SURGERY Right    infection  . POSTERIOR LUMBAR FUSION 4 LEVEL N/A 01/19/2018   Procedure: Thoracic Eight to pelvis fixation with Airo;  Surgeon: Erline Levine, MD;  Location: [place];  Service: Neurosurgery;  Laterality: N/A;    Assessment & Plan Clinical Impression:Jade Price is a 68 year old right-handed female with history of hyperlipidemia, chronic pain. Per chart review she lives with her spouse who is a retired Engineer, drilling. Independent prior to admission. Multilevel home. Spouse uses a cane himself. Presented 01/15/2018 low back pain radiating to lower extremities. X-rays and imaging revealed spinal stenosis and scoliosis with foraminal stenosis of lateral listhesis L3-4 stenosis with radiculopathy. Plans were arranged for 2 part procedure and underwent L5-S1 anterior lumbar interbody fusion, L1-2, 2-3, 3-4, 4-5 anterior lateral lumbar interbody fusion 01/15/2018 per Dr. Vertell Limber as well as second part of fusion 01/19/2018. Hospital course pain management. Acute blood loss anemia 5.5 and transfuse 2 units pack red blood cells with latest hemoglobin 7.8. TLSO back brace when out of bed applied in sitting position. Therapy evaluations completed with recommendations of physical medicine rehabilitation consult. Patient was admitted for a comprehensive rehabilitation program.  .    Patient currently requires min-mod  with basic self-care skills secondary to muscle weakness, decreased cardiorespiratoy endurance, decreased safety awareness and decreased memory and decreased sitting balance, decreased standing balance, decreased postural control, hemiplegia, decreased balance strategies and difficulty  maintaining precautions.  Prior to hospitalization, patient could complete BADL/IADL with independent .  Patient will benefit from skilled intervention to increase independence with basic self-care skills prior to discharge home with care partner.  Anticipate patient will require intermittent supervision and follow up home health.  OT - End of Session Activity Tolerance: Tolerates 30+ min activity with multiple rests Endurance Deficit: Yes OT Assessment Rehab Potential (ACUTE ONLY): Good OT Patient demonstrates impairments in the following area(s): Balance;Endurance;Motor;Pain;Safety OT Basic ADL's Functional Problem(s): Grooming;Bathing;Dressing;Toileting OT Advanced ADL's Functional Problem(s): Simple Meal Preparation OT Transfers Functional Problem(s): Toilet;Tub/Shower OT Additional Impairment(s): None OT Plan OT Intensity: Minimum of 1-2 x/day, 45 to 90 minutes OT Frequency: 5 out of 7 days OT Duration/Estimated Length of Stay: 7-10 OT Treatment/Interventions: Balance/vestibular training;Community reintegration;Disease mangement/prevention;Neuromuscular re-education;Patient/family education;Self Care/advanced ADL retraining;Therapeutic Exercise;UE/LE Coordination activities;Wheelchair propulsion/positioning;Cognitive remediation/compensation;Discharge planning;DME/adaptive equipment instruction;Functional mobility training;Pain management;Psychosocial support;Skin care/wound managment;Therapeutic Activities;UE/LE Strength taining/ROM;Visual/perceptual remediation/compensation OT Self Feeding Anticipated Outcome(s): no goal OT Basic Self-Care Anticipated Outcome(s): MOD I OT Toileting Anticipated Outcome(s): MOD I OT Bathroom Transfers Anticipated Outcome(s): MOD I OT Recommendation Patient destination: Home Follow Up Recommendations: Home health OT Equipment Recommended: 3 in 1 bedside comode;Tub/shower seat   Skilled Therapeutic Intervention 1;1. Pt received sidelying in bed with  66/10 pain in back radiating down LE. Pt reports already receiving medication and willing to shower. Pt educated on role/purpose of OT, CIR, ELOS and POC. Pt supine>sitting with CGA for trunk elevation. Pt ambulates with RW with MIN A to transfer into shower. Pt bathes sit to stand level with A to wash buttocks and B feet. Pt dresses EOB with A to thread LLE and don B socks. Pt able to don shirt and brace with set up. Pt grooms at sink in seated for oral care, brushing hair and face washing. OT educates pt on AE   OT Evaluation Precautions/Restrictions  Precautions Precautions: Fall;Back Precaution Booklet Issued: Yes (comment) Precaution Comments: handout provided previously, reviewed with patinet and required cueing to recall twisting  Required Braces or Orthoses: Spinal Brace Spinal Brace: Thoracolumbosacral orthotic Restrictions Weight Bearing Restrictions: No General Chart Reviewed: Yes Vital Signs  Pain Pain Assessment Pain Scale: 0-10 Pain Score: 7  Pain Type: Chronic pain Pain Location: Back(radiating on RLE) Pain Orientation: Lower Pain Descriptors / Indicators: Aching;Constant Pain Frequency: Constant Pain Onset: On-going Patients Stated Pain Goal: 2 Pain Intervention(s): Medication (See eMAR);Heat applied Multiple Pain Sites: No Home Living/Prior Functioning Home Living Family/patient expects to be discharged to:: Private residence Living Arrangements: Spouse/significant other Available Help at Discharge: Family, Available 24 hours/day Type of Home: House Home Access: Stairs to enter Technical brewer of Steps: 1+1 Entrance Stairs-Rails: None Home Layout: Multi-level, Able to live on main level with bedroom/bathroom Bathroom Shower/Tub: Gaffer, Door [employer] Toilet: Standard Bathroom Accessibility: Yes  Lives With: Spouse Prior Function Level of Independence: Independent with basic ADLs, Independent with transfers, Independent with gait  Able to  Take Stairs?: Yes Driving: Yes Vocation: Retired Comments: drives, retired, husband reports she has been refusing to use RW, but likely needed to PTA.  No h/o falls. Husband is a primary care/OBGYN doc ADL ADL Grooming: Setup Where Assessed-Grooming: Sitting at sink Upper Body Bathing: Setup Where Assessed-Upper Body Bathing: Shower Lower Body Bathing: Moderate assistance Where Assessed-Lower Body Bathing: Shower Upper Body Dressing:  Setup Where Assessed-Upper Body Dressing: Edge of bed Lower Body Dressing: Maximal assistance Where Assessed-Lower Body Dressing: Edge of bed Toileting: Minimal assistance Toilet Transfer: Minimal assistance Toilet Transfer Method: Magazine features editor: Minimal assistance Social research officer, government Method: Print production planner with back Vision Baseline Vision/History: Wears glasses Wears Glasses: Reading only Patient Visual Report: No change from baseline Vision Assessment?: No apparent visual deficits Perception  Perception: Within Functional Limits Praxis Praxis: Intact Cognition Orientation Level: Person Year: 2020 Month: January Day of Week: Correct Memory: Impaired Memory Impairment: Decreased recall of new information Immediate Memory Recall: Sock;Blue;Bed Memory Recall: Sock;Blue;Bed Memory Recall Sock: With Cue Memory Recall Blue: With Cue Memory Recall Bed: With Cue Sensation Sensation Light Touch: Appears Intact Proprioception: Appears Intact Coordination Gross Motor Movements are Fluid and Coordinated: Yes Fine Motor Movements are Fluid and Coordinated: Yes Motor  Motor Motor - Skilled Clinical Observations: generalized weakness Mobility  Bed Mobility Rolling Right: Minimal Assistance - Patient > 75% Rolling Left: Minimal Assistance - Patient > 75% Right Sidelying to Sit: Minimal Assistance - Patient > 75%  Trunk/Postural Assessment  Cervical Assessment Cervical Assessment:  Within Functional Limits Thoracic Assessment Thoracic Assessment: Exceptions to WFL(TLSO) Lumbar Assessment Lumbar Assessment: Exceptions to WFL(TLSO) Postural Control Postural Control: Deficits on evaluation Righting Reactions: delayed  Balance Dynamic Sitting Balance Sitting balance - Comments: reliant on UB support during bathing and dressing Dynamic Standing Balance Dynamic Standing - Comments: MIN A withRW Extremity/Trunk Assessment RUE Assessment RUE Assessment: Within Functional Limits LUE Assessment LUE Assessment: Within Functional Limits     Refer to Care Plan for Long Term Goals  Recommendations for other services: Therapeutic Recreation  Pet therapy, Kitchen group and Outing/community reintegration   Discharge Criteria: Patient will be discharged from OT if patient refuses treatment 3 consecutive times without medical reason, if treatment goals not met, if there is a change in medical status, if patient makes no progress towards goals or if patient is discharged from hospital.  The above assessment, treatment plan, treatment alternatives and goals were discussed and mutually agreed upon: by patient  Tonny Branch 01/24/2018, 11:00 AM

## 2018-01-24 NOTE — Progress Notes (Signed)
Physical Therapy Session Note  Patient Details  Name: [name] MRN: [id number] Date of Birth: Jun 21, 1950  Today's Date: 01/24/2018 PT Individual Time: 3149-7026 PT Individual Time Calculation (min): 55 min   Short Term Goals: Week 1:  PT Short Term Goal 1 (Week 1): = LTG  Skilled Therapeutic Interventions/Progress Updates:   Pt received supine in bed and agreeable to PT. Supine>sit transfer with CGA assist and min cues to maintain back precautions. PT applied TLSO in sitting. Stand pivot transfer to [facility] with min assist and min cues for proper UE placement tim improve safety. PT instructed pt in [place] mobility through hall with min cues for increased use of the LUE to maintain straight path and navigation of obstacles through hall. Pt instructed in car transfer with min assist to small SUV height. Education for use of sedan or small SUV to improve safety of car transfer upon d/c. Gait training instructed by PT x 33f with RW min assist and min cues for step length and AD management. Stair negotiation training x 4 steps with BUE support and min assist from PT. Moderate cues for step to gait pattern, posture and UE placement to improve safety. Pt returned to room and performed stand pivot transfer to bed with min assist. Sit>supine completed with min assist to control the RLE and left supine in bed with call bell in reach and all needs met.        Therapy Documentation Precautions:  Precautions Precautions: Fall, Back Precaution Booklet Issued: Yes (comment) Precaution Comments: handout provided previously, reviewed with patinet and required cueing to recall twisting  Required Braces or Orthoses: Spinal Brace Spinal Brace: Thoracolumbosacral orthotic Restrictions Weight Bearing Restrictions: No Pain: Pain Assessment Pain Score: 7  Pain Type: Chronic pain Pain Location: Back(radiating on RLE)  See MAR   Therapy/Group: Individual Therapy  ALorie Phenix1/18/2020, 1:59 PM

## 2018-01-25 ENCOUNTER — Inpatient Hospital Stay (HOSPITAL_COMMUNITY): Payer: Medicare Other

## 2018-01-25 MED ORDER — TRAZODONE HCL 50 MG PO TABS
50.0000 mg | ORAL_TABLET | Freq: Every evening | ORAL | Status: DC | PRN
  Administered 2018-01-25 – 2018-01-29 (×3): 50 mg via ORAL
  Filled 2018-01-25 (×3): qty 1

## 2018-01-25 MED ORDER — METHOCARBAMOL 750 MG PO TABS
750.0000 mg | ORAL_TABLET | Freq: Four times a day (QID) | ORAL | Status: DC | PRN
  Administered 2018-01-25 – 2018-01-30 (×15): 750 mg via ORAL
  Filled 2018-01-25 (×16): qty 1

## 2018-01-25 NOTE — Progress Notes (Signed)
[facility] MEDICINE [facility]ABILITATION PROGRESS NOTE   Subjective/Complaints: Patient still struggling with her pain to a certain extent.  Had spasms in increased pain after therapy yesterday.  Sleep is affected.  Pain is no worse than it has been, however  ROS: Patient denies fever, rash, sore throat, blurred vision, nausea, vomiting, diarrhea, cough, shortness of breath or chest pain,  headache, or mood change.    Objective:   Vas [place] Lower Extremity Venous (dvt)  Result Date: 01/24/2018  Lower Venous Study Indications: Edema.  Performing Technologist: Toma Deiters RVS  Examination Guidelines: A complete evaluation includes B-mode imaging, spectral Doppler, color Doppler, and power Doppler as needed of all accessible portions of each vessel. Bilateral testing is considered an integral part of a complete examination. Limited examinations for reoccurring indications may be performed as noted.  Right Venous Findings: +---------+---------------+---------+-----------+----------+-------+          CompressibilityPhasicitySpontaneityPropertiesSummary +---------+---------------+---------+-----------+----------+-------+ CFV      Full           Yes      Yes                          +---------+---------------+---------+-----------+----------+-------+ SFJ      Full                                                 +---------+---------------+---------+-----------+----------+-------+ FV Prox  Full           Yes      Yes                          +---------+---------------+---------+-----------+----------+-------+ FV Mid   Full                                                 +---------+---------------+---------+-----------+----------+-------+ FV DistalFull           Yes      Yes                          +---------+---------------+---------+-----------+----------+-------+ PFV      Full           Yes      Yes                           +---------+---------------+---------+-----------+----------+-------+ POP      Full           Yes      Yes                          +---------+---------------+---------+-----------+----------+-------+ PTV      Full                                                 +---------+---------------+---------+-----------+----------+-------+ PERO     Full                                                 +---------+---------------+---------+-----------+----------+-------+  Left Venous Findings: +---------+---------------+---------+-----------+----------+-------+          CompressibilityPhasicitySpontaneityPropertiesSummary +---------+---------------+---------+-----------+----------+-------+ CFV      Full           Yes      Yes                          +---------+---------------+---------+-----------+----------+-------+ SFJ      Full                                                 +---------+---------------+---------+-----------+----------+-------+ FV Prox  Full           Yes      Yes                          +---------+---------------+---------+-----------+----------+-------+ FV Mid   Full                                                 +---------+---------------+---------+-----------+----------+-------+ FV DistalFull           Yes      Yes                          +---------+---------------+---------+-----------+----------+-------+ PFV      Full           Yes      Yes                          +---------+---------------+---------+-----------+----------+-------+ POP      Full           Yes      Yes                          +---------+---------------+---------+-----------+----------+-------+ PTV      Full                                                 +---------+---------------+---------+-----------+----------+-------+ PERO     Full                                                  +---------+---------------+---------+-----------+----------+-------+ There is an area of mixed echoes in the popliteal fossa measuring 3.78 cm X 1.30 cm consistent with a possible Baker's cyst    Summary: Right: There is no evidence of deep vein thrombosis in the lower extremity. No cystic structure found in the popliteal fossa. Left: There is no evidence of deep vein thrombosis in the lower extremity. A cystic structure is found in the popliteal fossa.  *See table(s) above for measurements and observations.    Preliminary    Recent Labs    01/24/18 0620  WBC 7.7  HGB 8.1*  HCT 26.2*  PLT 383   Recent Labs    01/24/18 0620  NA 138  K 3.1*  CL  100  CO2 27  GLUCOSE 119*  BUN 6*  CREATININE 0.52  CALCIUM 7.9*    Intake/Output Summary (Last 24 hours) at 01/25/2018 0948 Last data filed at 01/25/2018 0900 Gross per 24 hour  Intake 960 ml  Output -  Net 960 ml     Physical Exam: Vital Signs Blood pressure 121/70, pulse 93, temperature 98.8 F (37.1 C), temperature source Oral, resp. rate 16, weight 70.1 kg, SpO2 97 %. Constitutional: No distress . Vital signs reviewed. HEENT: EOMI, oral membranes moist Neck: supple Cardiovascular: RRR without murmur. No JVD    Respiratory: CTA Bilaterally without wheezes or rales. Normal effort    GI: BS +, non-tender, non-distended  Musculoskeletal:  General: No edema.  Comments: Low and mid back remain tender.  Appears more comfortable than yesterday Neurological: She is alertand oriented to person, place, and time. Nocranial nerve deficit. Patient is alert. She is a bit anxious. Follows full commands. Provides her name, age and date of birth. UE 4-5/5 in UE's. LE: 3+ HF, KE, 4/5 ADF/PF--- motor exam unchanged. No focal sensory deficits. DTR's 2+ LLE and 1+ RLE--- no changeskin: Skin iswarm. Noerythema. Back incision remains cdi Psychiatric: Pleasant and appropriate.    Assessment/Plan: 1. Functional deficits secondary to  lumbar stenosis with radiculopathy/neurogenic claudication which require 3+ hours per day of interdisciplinary therapy in a comprehensive inpatient rehab setting.  Physiatrist is providing close team supervision and 24 hour management of active medical problems listed below.  Physiatrist and rehab team continue to assess barriers to discharge/monitor patient progress toward functional and medical goals  Care Tool:  Bathing    Body parts bathed by patient: Right arm, Left arm, Chest, Abdomen, Front perineal area, Face, Left upper leg, Right upper leg   Body parts bathed by helper: Buttocks, Left lower leg, Right lower leg     Bathing assist Assist Level: Minimal Assistance - Patient > 75%     Upper Body Dressing/Undressing Upper body dressing   What is the patient wearing?: Bra, Orthosis    Upper body assist Assist Level: Set up assist    Lower Body Dressing/Undressing Lower body dressing      What is the patient wearing?: Pants     Lower body assist Assist for lower body dressing: Moderate Assistance - Patient 50 - 74%     Toileting Toileting    Toileting assist Assist for toileting: Contact Guard/Touching assist     Transfers Chair/bed transfer  Transfers assist     Chair/bed transfer assist level: Minimal Assistance - Patient > 75%     Locomotion Ambulation   Ambulation assist      Assist level: Minimal Assistance - Patient > 75% Assistive device: Walker-rolling Max distance: 3970ft   Walk 10 feet activity   Assist     Assist level: Minimal Assistance - Patient > 75% Assistive device: Walker-rolling   Walk 50 feet activity   Assist Walk 50 feet with 2 turns activity did not occur: Safety/medical concerns  Assist level: Minimal Assistance - Patient > 75% Assistive device: Walker-rolling    Walk 150 feet activity   Assist Walk 150 feet activity did not occur: Safety/medical concerns         Walk 10 feet on uneven surface   activity   Assist Walk 10 feet on uneven surfaces activity did not occur: Safety/medical concerns         Wheelchair     Assist        Wheelchair assist level: Supervision/Verbal  cueing Max wheelchair distance: 171ft    Wheelchair 50 feet with 2 turns activity    Assist        Assist Level: Supervision/Verbal cueing   Wheelchair 150 feet activity     Assist     Assist Level: Supervision/Verbal cueing     Medical Problem List and Plan: 1.Decreased functional mobilitysecondary to 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 DVT Prophylaxis/Anticoagulation: SCDs. Check vascular study 3. Pain Management:using oxycodone and dilaudid q2 prn on acute -now off dilaudid -continue Robaxin and oxycodone needed. Increase Robaxin to 750 mg every 6 as needed -continue MS contin 15mg  q12 for more consistent pain control -kpad, ice    4. Mood:Ativan 0.5 milligrams every 4 hours as needed.Provide emotional support 5. Neuropsych: This patientiscapable of making decisions on herown behalf. 6. Skin/Wound Care:Routine skin checks, local care to back 7. Fluids/Electrolytes/Nutrition:encourage PO  -I personally reviewed the patient's labs today.    -potassium 3.1--- continue K. Dur daily 8. Acute blood loss anemia. hgb 8.1 1/18 (up from 7.8)  -Fe++ supplement  -recheck next week 9. Constipation. Laxative assistance    LOS: 2 days A FACE TO FACE EVALUATION WAS PERFORMED  Ranelle Oyster 01/25/2018, 9:48 AM

## 2018-01-25 NOTE — Progress Notes (Signed)
Physical Therapy Session Note  Patient Details  Name: Jade Price MRN: [id number] Date of Birth: 05-12-1950  Today's Date: 01/25/2018 PT Individual Time: 1400-1510 PT Individual Time Calculation (min): 70 min   Short Term Goals: Week 1:  PT Short Term Goal 1 (Week 1): = LTG  Skilled Therapeutic Interventions/Progress Updates:    Pt supine in bed upon PT arrival, agreeable to therapy tx and reports pain 7/10 in back. Pt transferred to sitting and donned TLSO with set up assist. Pt performed sit<>stand with min assist and RW, ambulated into bathroom and performed toileting tasks with supervision. Pt requesting to take a shower this session. Pt ambulated to shower with RW and min assist, pt required min assist for standing balance while performing clothing management. Pt performed bathing with supervision/set up assist, long handel shower brush used to each legs and feet. Pt performed sit<>stand in shower with supervision to wash backside. Pt performed stand pivot out of shower to w/c with min assist. Pt donned clean clothes with supervision/min assist. Pt transported to the gym. Pt ambulated 2 x 60 ft this session working on activity tolerance and endurance. Pt transported to dayroom and transfer to nustep with min assist. Pt used nustep x 8 minutes on workload 4 for global strengthening and endurance. Pt transported back to room and brushed teeth while seated at sink. Pt transferred to bed min assist with RW and left supine with needs in reach and bed alarm set.    Therapy Documentation Precautions:  Precautions Precautions: Fall, Back Precaution Booklet Issued: Yes (comment) Precaution Comments: handout provided previously, reviewed with patinet and required cueing to recall twisting  Required Braces or Orthoses: Spinal Brace Spinal Brace: Thoracolumbosacral orthotic Restrictions Weight Bearing Restrictions: No    Therapy/Group: Individual Therapy  Cresenciano Genre, PT, DPT 01/25/2018,  1:01 PM

## 2018-01-25 NOTE — Plan of Care (Signed)
  Problem: Consults Goal: RH GENERAL PATIENT EDUCATION Description See Patient Education module for education specifics. 01/25/2018 1442 by Kalman Shan, LPN Outcome: Progressing 01/25/2018 1442 by Doran Durand A, LPN Outcome: Progressing   Problem: RH BOWEL ELIMINATION Goal: RH STG MANAGE BOWEL WITH ASSISTANCE Description STG Manage Bowel with min  Assistance.  01/25/2018 1442 by Kalman Shan, LPN Outcome: Progressing 01/25/2018 1442 by Kalman Shan, LPN Outcome: Progressing Goal: RH STG MANAGE BOWEL W/MEDICATION W/ASSISTANCE Description STG Manage Bowel with Medication with min Assistance.  01/25/2018 1442 by Kalman Shan, LPN Outcome: Progressing 01/25/2018 1442 by Kalman Shan, LPN Outcome: Progressing   Problem: RH SKIN INTEGRITY Goal: RH STG MAINTAIN SKIN INTEGRITY WITH ASSISTANCE Description STG Maintain Skin Integrity With min  Assistance.  01/25/2018 1442 by Kalman Shan, LPN Outcome: Progressing 01/25/2018 1442 by Doran Durand A, LPN Outcome: Progressing   Problem: RH SAFETY Goal: RH STG ADHERE TO SAFETY PRECAUTIONS W/ASSISTANCE/DEVICE Description STG Adhere to Safety Precautions With min Assistance/Device.  01/25/2018 1442 by Kalman Shan, LPN Outcome: Progressing 01/25/2018 1442 by Kalman Shan, LPN Outcome: Progressing   Problem: RH PAIN MANAGEMENT Goal: RH STG PAIN MANAGED AT OR BELOW PT'S PAIN GOAL Description Pt will report pain of less than 3 out of 10 with use of PRN pain medicaitons  01/25/2018 1442 by Kalman Shan, LPN Outcome: Progressing 01/25/2018 1442 by Doran Durand A, LPN Outcome: Progressing   Problem: RH KNOWLEDGE DEFICIT GENERAL Goal: RH STG INCREASE KNOWLEDGE OF SELF CARE AFTER HOSPITALIZATION Description Pt will be able to verbalize when to use soft lumbar corsett, be able to apply corsett without assistance.  Adhere to proper transfer techniques without cuing. Manage and  maintain adequate pain control to perform daily activities.    01/25/2018 1442 by Kalman Shan, LPN Outcome: Progressing 01/25/2018 1442 by Kalman Shan, LPN Outcome: Progressing

## 2018-01-26 ENCOUNTER — Inpatient Hospital Stay (HOSPITAL_COMMUNITY): Payer: Medicare Other | Admitting: Physical Therapy

## 2018-01-26 ENCOUNTER — Inpatient Hospital Stay (HOSPITAL_COMMUNITY): Payer: Medicare Other | Admitting: Occupational Therapy

## 2018-01-26 DIAGNOSIS — M961 Postlaminectomy syndrome, not elsewhere classified: Secondary | ICD-10-CM

## 2018-01-26 DIAGNOSIS — K5901 Slow transit constipation: Secondary | ICD-10-CM

## 2018-01-26 MED ORDER — SENNOSIDES-DOCUSATE SODIUM 8.6-50 MG PO TABS
2.0000 | ORAL_TABLET | Freq: Two times a day (BID) | ORAL | Status: DC
  Administered 2018-01-26 – 2018-01-30 (×9): 2 via ORAL
  Filled 2018-01-26 (×9): qty 2

## 2018-01-26 MED ORDER — FLEET ENEMA 7-19 GM/118ML RE ENEM
1.0000 | ENEMA | Freq: Every day | RECTAL | Status: DC | PRN
  Administered 2018-01-27: 1 via RECTAL
  Filled 2018-01-26: qty 1

## 2018-01-26 MED ORDER — OXYCODONE HCL ER 10 MG PO T12A
10.0000 mg | EXTENDED_RELEASE_TABLET | Freq: Two times a day (BID) | ORAL | Status: DC
  Administered 2018-01-26 – 2018-01-27 (×3): 10 mg via ORAL
  Filled 2018-01-26 (×4): qty 1

## 2018-01-26 NOTE — Progress Notes (Addendum)
Physical Therapy Session Note  Patient Details  Name: Jade MaskKathy Waibel MRN: [id number] Date of Birth: 12/25/1950  Today's Date: 01/26/2018 PT Individual Time: 1421-1535 PT Individual Time Calculation (min): 74 min   Short Term Goals: Week 1:  PT Short Term Goal 1 (Week 1): = LTG  Skilled Therapeutic Interventions/Progress Updates:  Pt received in bathroom in care of RN. Pt with continent void & manages clothing without assistance, ambulating to w/c with RW & CGA without LSO donned. Therapist educated pt & RN on need to don LSO sitting although one order states pt can ambulate to bathroom without brace - asked PA (Pam) to clarify. Pt completes hand hygiene at sink from w/c level with set up assist. Pt dons LSO & sweater with set up assist, maintaining back precautions. Transported pt to gym via w/c dependent assist for time management. Pt completes sit<>stand with CGA and ambulates 200 ft with RW & CGA with decreased gait speed. Pt reports she has 1 platform + 1 platform step to access house without rails. Pt negotiates single step (3") multiple times  with RW & CGA with cuing for sequencing and technique. Pt reports she plans to ride home in a friend's escalade and only has acces to tall SUV/trucks unless her daughter can transport her in her [employer] Niecevan - asked pt to get seat measurements and see if she can use her daughter's [employer] Niecevan. Pt completed car transfer at elevated van seat height (still not as tall as SUV) with RW & CGA with cuing to sit then place BLE in/out of car. Pt uses UE to assist LE in/out of car. Provided pt with ginger ale & crackers at her request and encouraged her to consume food when taking pain medication. Pt propels w/c back to room with BUE & supervision with 2 rest breaks 2/2 fatigue. Discussed HHPT f/u and DME recommendations, as well as supervision goals. At end of session pt left sitting in w/c in room with chair alarm donned, call bell & needs in reach, husband present to supervise.   Pt  reports 8/10 back pain but is premedicated. Reviewed pain medication schedule with pt & husband.  Reviewed back precautions with pt recalling 3/3 without assistance.  Therapy Documentation Precautions:  Precautions Precautions: Fall, Back Precaution Booklet Issued: Yes (comment) Precaution Comments: handout provided previously, reviewed with patinet and required cueing to recall twisting  Required Braces or Orthoses: Spinal Brace Spinal Brace: Thoracolumbosacral orthotic Restrictions Weight Bearing Restrictions: No    Therapy/Group: Individual Therapy  Sandi MariscalVictoria M Mckenzey Parcell 01/26/2018, 3:42 PM

## 2018-01-26 NOTE — Plan of Care (Signed)
  Problem: Consults Goal: RH GENERAL PATIENT EDUCATION Description See Patient Education module for education specifics. Outcome: Progressing   Problem: RH BOWEL ELIMINATION Goal: RH STG MANAGE BOWEL WITH ASSISTANCE Description STG Manage Bowel with min  Assistance.  Outcome: Progressing Goal: RH STG MANAGE BOWEL W/MEDICATION W/ASSISTANCE Description STG Manage Bowel with Medication with min Assistance.  Outcome: Progressing   Problem: RH SKIN INTEGRITY Goal: RH STG MAINTAIN SKIN INTEGRITY WITH ASSISTANCE Description STG Maintain Skin Integrity With min  Assistance.  Outcome: Progressing   Problem: RH SAFETY Goal: RH STG ADHERE TO SAFETY PRECAUTIONS W/ASSISTANCE/DEVICE Description STG Adhere to Safety Precautions With min Assistance/Device.  Outcome: Progressing   Problem: RH PAIN MANAGEMENT Goal: RH STG PAIN MANAGED AT OR BELOW PT'S PAIN GOAL Description Pt will report pain of less than 3 out of 10 with use of PRN pain medicaitons  Outcome: Progressing   Problem: RH KNOWLEDGE DEFICIT GENERAL Goal: RH STG INCREASE KNOWLEDGE OF SELF CARE AFTER HOSPITALIZATION Description Pt will be able to verbalize when to use soft lumbar corsett, be able to apply corsett without assistance.  Adhere to proper transfer techniques without cuing. Manage and maintain adequate pain control to perform daily activities.    Outcome: Progressing   

## 2018-01-26 NOTE — Progress Notes (Signed)
Inpatient Rehabilitation  Patient information reviewed and entered into eRehab system by Didier Brandenburg M. Momina Hunton, M.A., CCC/SLP, PPS Coordinator.  Information including medical coding, functional ability and quality indicators will be reviewed and updated through discharge.    Per nursing patient was given "Data Collection Information Summary" for Patients in Inpatient Rehabilitation Facilities with attached "Privacy Act Statement-Health Care Records" upon admission.   

## 2018-01-26 NOTE — IPOC Note (Addendum)
Overall Plan of Care [facility]) Patient Details Name: Jade Price MRN: [id number] DOB: Feb 24, 1950  Admitting Diagnosis: <principal problem not specified>  Hospital Problems: Active Problems:   Radiculopathy     Functional Problem List: Nursing Medication Management, Bowel, Pain  PT Balance, Endurance, Motor, Pain, Safety  OT Balance, Endurance, Motor, Pain, Safety  SLP    TR   Activity tolerance, functional mobility, balance, safety, pain, anxiety/stress         Basic ADL's: OT Grooming, Bathing, Dressing, Toileting     Advanced  ADL's: OT Simple Meal Preparation     Transfers: PT Bed Mobility, Bed to Chair, Car, Occupational psychologist, Research scientist (life sciences): PT Stairs, Psychologist, prison and probation services, Ambulation     Additional Impairments: OT None  SLP        TR  community integration    Anticipated Outcomes Item Anticipated Outcome  Self Feeding no goal  Swallowing      Basic self-care  MOD I  Toileting  MOD I   Bathroom Transfers MOD I  Bowel/Bladder  to have regular BMs while in rehand  Transfers  mod I  Locomotion  mod I  Communication     Cognition     Pain  pain less than 3 out of 10 while in rehab  Safety/Judgment  to remain fall free while in rehab   Therapy Plan: PT Intensity: Minimum of 1-2 x/day ,45 to 90 minutes PT Frequency: 5 out of 7 days PT Duration Estimated Length of Stay: 5-7 days OT Intensity: Minimum of 1-2 x/day, 45 to 90 minutes OT Frequency: 5 out of 7 days OT Duration/Estimated Length of Stay: 7-10  TR Duration/ELOS:  10 Days TR Frequency:  Min 1 time >45 minutes for community reintegration        Team Interventions: Nursing Interventions Patient/Family Education, Bowel Management, Pain Management  PT interventions Ambulation/gait training, Firefighter, Fish farm manager, Neuromuscular re-education, Museum/gallery curator, UE/LE Strength taining/ROM, Wheelchair propulsion/positioning, Designer, jewellery, Discharge planning, Pain management, Therapeutic Activities, UE/LE Coordination activities, Functional electrical stimulation, Cognitive remediation/compensation, Functional mobility training, Patient/family education, Splinting/orthotics, Therapeutic Exercise  OT Interventions Warden/ranger, Community reintegration, Disease mangement/prevention, Neuromuscular re-education, Patient/family education, Self Care/advanced ADL retraining, Therapeutic Exercise, UE/LE Coordination activities, Wheelchair propulsion/positioning, Cognitive remediation/compensation, Discharge planning, DME/adaptive equipment instruction, Functional mobility training, Pain management, Psychosocial support, Skin care/wound managment, Therapeutic Activities, UE/LE Strength taining/ROM, Visual/perceptual remediation/compensation  SLP Interventions    TR Interventions   functional mobility, therapeutic activities, UE/LE strength/coordination, w/c mobility, community reintegration, pt/family education, adaptive equipment instruction/use, discharge planning, psychosocial support  SW/CM Interventions Discharge Planning, Psychosocial Support, Patient/Family Education   Barriers to Discharge MD  Medical stability and Wound care  Nursing      PT      OT      SLP      SW       Team Discharge Planning: Destination: PT-Home ,OT- Home , SLP-  Projected Follow-up: PT-Home health PT, OT-  Home health OT, SLP-  Projected Equipment Needs: PT-To be determined, OT- 3 in 1 bedside comode, Tub/shower seat, SLP-  Equipment Details: PT- , OT-  Patient/family involved in discharge planning: PT- Patient,  OT-Patient, Family member/caregiver, SLP-   MD ELOS: 7-10d Medical Rehab Prognosis:  Good Assessment:  68 year old right-handed female with history of hyperlipidemia, chronic pain. Per chart review she lives with her spouse who is a retired Development worker, community. Independent prior to admission. Multilevel home. Spouse uses a cane  himself. Presented 01/15/2018 low back  pain radiating to lower extremities. X-rays and imaging revealed spinal stenosis and scoliosis with foraminal stenosis of lateral listhesis L3-4 stenosis with radiculopathy. Plans were arranged for 2 part procedure and underwent L5-S1 anterior lumbar interbody fusion, L1-2, 2-3, 3-4, 4-5 anterior lateral lumbar interbody fusion 01/15/2018 per Dr. Venetia Maxon as well as second part of fusion 01/19/2018. Hospital course pain management. Acute blood loss anemia 5.5 and transfuse 2 units pack red blood cells with latest hemoglobin 7.8. TLSO back brace when out of bed applied in sitting position  Now requiring 24/7 Rehab RN,MD, as well as CIR level PT, OT  Treatment team will focus on ADLs and mobility with goals set at Mod I   See Team Conference Notes for weekly updates to the plan of care

## 2018-01-26 NOTE — Progress Notes (Signed)
[facility] PHYSICAL MEDICINE & REHABILITATION PROGRESS NOTE   Subjective/Complaints: No BM since pre op tried dulc, on senna 1 BID Pain still an issue , Morphine nauseates her does not work as well as Oxy  ROS: Patient deniesCP, SOB, N/V/D   Objective:   No results found. Recent Labs    01/24/18 0620  WBC 7.7  HGB 8.1*  HCT 26.2*  PLT 383   Recent Labs    01/24/18 0620  NA 138  K 3.1*  CL 100  CO2 27  GLUCOSE 119*  BUN 6*  CREATININE 0.52  CALCIUM 7.9*    Intake/Output Summary (Last 24 hours) at 01/26/2018 0930 Last data filed at 01/25/2018 2149 Gross per 24 hour  Intake 240 ml  Output 2 ml  Net 238 ml     Physical Exam: Vital Signs Blood pressure (!) 99/51, pulse 93, temperature 98 F (36.7 C), temperature source Oral, resp. rate 18, weight 70.1 kg, SpO2 97 %. Constitutional: No distress . Vital signs reviewed. HEENT: EOMI, oral membranes moist Neck: supple Cardiovascular: RRR without murmur. No JVD    Respiratory: CTA Bilaterally without wheezes or rales. Normal effort    GI: BS +, non-tender, non-distended  Musculoskeletal:  General: No edema.  Comments: Low and mid back remain tender.  Appears more comfortable than yesterday Neurological: She is alertand oriented to person, place, and time. Nocranial nerve deficit. Patient is alert. She is a bit anxious. Follows full commands. Provides her name, age and date of birth. UE 4-5/5 in UE's. LE: 3+ HF, KE, 4/5 ADF/PF--- motor exam unchanged. No focal sensory deficits. DTR's 2+ LLE and 1+ RLE--- no changeskin: Skin iswarm. Noerythema. Back incision remains cdi Psychiatric: Pleasant and appropriate.    Assessment/Plan: 1. Functional deficits secondary to lumbar stenosis with radiculopathy/neurogenic claudication which require 3+ hours per day of interdisciplinary therapy in a comprehensive inpatient rehab setting.  Physiatrist is providing close team supervision and 24 hour management of  active medical problems listed below.  Physiatrist and rehab team continue to assess barriers to discharge/monitor patient progress toward functional and medical goals  Care Tool:  Bathing    Body parts bathed by patient: Right arm, Left arm, Chest, Abdomen, Front perineal area, Face, Left upper leg, Right upper leg   Body parts bathed by helper: Buttocks, Left lower leg, Right lower leg     Bathing assist Assist Level: Minimal Assistance - Patient > 75%     Upper Body Dressing/Undressing Upper body dressing   What is the patient wearing?: Pull over shirt    Upper body assist Assist Level: Set up assist    Lower Body Dressing/Undressing Lower body dressing      What is the patient wearing?: Pants     Lower body assist Assist for lower body dressing: Moderate Assistance - Patient 50 - 74%     Toileting Toileting    Toileting assist Assist for toileting: Contact Guard/Touching assist     Transfers Chair/bed transfer  Transfers assist     Chair/bed transfer assist level: Minimal Assistance - Patient > 75%     Locomotion Ambulation   Ambulation assist      Assist level: Minimal Assistance - Patient > 75% Assistive device: Walker-rolling Max distance: 60 ft   Walk 10 feet activity   Assist     Assist level: Minimal Assistance - Patient > 75% Assistive device: Walker-rolling   Walk 50 feet activity   Assist Walk 50 feet with 2 turns activity did not  occur: Safety/medical concerns  Assist level: Minimal Assistance - Patient > 75% Assistive device: Walker-rolling    Walk 150 feet activity   Assist Walk 150 feet activity did not occur: Safety/medical concerns         Walk 10 feet on uneven surface  activity   Assist Walk 10 feet on uneven surfaces activity did not occur: Safety/medical concerns         Wheelchair     Assist        Wheelchair assist level: Supervision/Verbal cueing Max wheelchair distance: 13950ft     Wheelchair 50 feet with 2 turns activity    Assist        Assist Level: Supervision/Verbal cueing   Wheelchair 150 feet activity     Assist     Assist Level: Supervision/Verbal cueing     Medical Problem List and Plan: 1.Decreased functional mobilitysecondary to 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 DVT Prophylaxis/Anticoagulation: SCDs. Check vascular study 3. Pain Management:using oxycodone 5-10mg  q3h prn -now off dilaudid -continue Robaxin and oxycodone needed. Increase Robaxin to 750 mg every 6 as needed -discontinue MS contin 15mg  [facility]  BID -kpad, ice    4. Mood:Ativan 0.5 milligrams every 4 hours as needed.Provide emotional support 5. Neuropsych: This patientiscapable of making decisions on herown behalf. 6. Skin/Wound Care:Routine skin checks, local care to back 7. Fluids/Electrolytes/Nutrition:encourage PO  -I personally reviewed the patient's labs today.    -potassium 3.1--- continue K. Dur 20meq daily 8. Acute blood loss anemia. hgb 8.1 1/18 (up from 7.8)  -Fe++ supplement  -recheck this wk 9. Constipation. Laxative assistance, no BM since admit, no result with dulc supp, will add fleets enema, increase senna to 2 tab BID    LOS: 3 days A FACE TO FACE EVALUATION WAS PERFORMED  Erick Colacendrew E Tobin Witucki 01/26/2018, 9:30 AM

## 2018-01-26 NOTE — Progress Notes (Signed)
Occupational Therapy Session Note  Patient Details  Name: Jade Price MRN: [id number] Date of Birth: [birth year]  Today's Date: 01/26/2018 OT Individual Time: 6834-1962  & 1100-1205 OT Individual Time Calculation (min): 58 min & 65 min   Short Term Goals: Week 1:  OT Short Term Goal 1 (Week 1): n/a d/t ELOS  Skilled Therapeutic Interventions/Progress Updates:    session 1 - patient in bed upon arrival, states that she had a terrible night with pain and did not sleep but she is agreeable to shower and change clothes - bed mobility with min A, SPT to from bed, shower seat, toilet with RW CG/steadying assist, ambulation in room with RW CG A.  Bathing completed with LHBS - requires min A for feet and buttocks, UB dressing and TLSO with set up/CS, LB dressing mod A, toileting with CG A,  Grooming/eating in seated position I.   Reviewed pain control options - provided ice pack in side lying position with good results.  Pain and fatigue limited activity this session.   Patient returned to bed for rest with bed alarm set and call bell in reach  Session 2 - Patient in bed but agreeable to attend therapy session.  Pain persists and patient states that she feels "out of it" due to the pain meds.  She states that the MD is adjusting medication today.  TLSO donned with set up assist.  Bed mobility and functional transfers to toilet completed with CG A, Toileting CG, Grooming at w/c level Independent.   Reviewed use of Assistive devices for LB dressing and practiced with various length and style reachers. (She prefers the 53" reacher)  Patient has both tub shower and shower stall options at home - reviewed DME and process for each option.  Reviewed options for toilet height/DME and grab bars in shower and around toilet.   Basic HM techniques reviewed and demonstrated.  Patient is not feeling well enough to complete hands on practice today.  She returned to bed at close of session with call bell and alarm  set  Therapy Documentation Precautions:  Precautions Precautions: Fall, Back Precaution Booklet Issued: Yes (comment) Precaution Comments: handout provided previously, reviewed with patinet and required cueing to recall twisting  Required Braces or Orthoses: Spinal Brace Spinal Brace: Thoracolumbosacral orthotic Restrictions Weight Bearing Restrictions: No General:   Vital Signs:  Pain: Pain Assessment Pain Scale: 0-10 Pain Score: 8  Pain Location: Back Pain Orientation: Mid Pain Descriptors / Indicators: Heaviness;Operative site guarding Pain Onset: On-going Pain Intervention(s): Repositioned;Cold applied;Shower   Other Treatments:     Therapy/Group: Individual Therapy  Barrie Lyme 01/26/2018, 12:20 PM

## 2018-01-27 ENCOUNTER — Inpatient Hospital Stay (HOSPITAL_COMMUNITY): Payer: Medicare Other | Admitting: Physical Therapy

## 2018-01-27 ENCOUNTER — Inpatient Hospital Stay (HOSPITAL_COMMUNITY): Payer: Medicare Other | Admitting: Occupational Therapy

## 2018-01-27 LAB — BASIC METABOLIC PANEL
Anion gap: 7 (ref 5–15)
BUN: 8 mg/dL (ref 8–23)
CO2: 26 mmol/L (ref 22–32)
Calcium: 8.2 mg/dL — ABNORMAL LOW (ref 8.9–10.3)
Chloride: 100 mmol/L (ref 98–111)
Creatinine, Ser: 0.55 mg/dL (ref 0.44–1.00)
GFR calc Af Amer: >60 mL/min (ref >60)
GFR calc non Af Amer: >60 mL/min (ref >60)
Glucose, Bld: 125 mg/dL — ABNORMAL HIGH (ref 70–99)
Potassium: 3.9 mmol/L (ref 3.5–5.1)
Sodium: 133 mmol/L — ABNORMAL LOW (ref 135–145)

## 2018-01-27 LAB — CBC
HCT: 25.9 % — ABNORMAL LOW (ref 36.0–46.0)
Hemoglobin: 8.2 g/dL — ABNORMAL LOW (ref 12.0–15.0)
MCH: 28.5 pg (ref 26.0–34.0)
MCHC: 31.7 g/dL (ref 30.0–36.0)
MCV: 89.9 fL (ref 80.0–100.0)
Platelets: 554 10*3/uL — ABNORMAL HIGH (ref 150–400)
RBC: 2.88 MIL/uL — ABNORMAL LOW (ref 3.87–5.11)
RDW: 13.6 % (ref 11.5–15.5)
WBC: 9.5 10*3/uL (ref 4.0–10.5)
nRBC: 0 % (ref 0.0–0.2)

## 2018-01-27 MED ORDER — PANTOPRAZOLE SODIUM 40 MG PO TBEC
40.0000 mg | DELAYED_RELEASE_TABLET | Freq: Every day | ORAL | Status: DC
  Administered 2018-01-28 – 2018-01-30 (×3): 40 mg via ORAL
  Filled 2018-01-27 (×3): qty 1

## 2018-01-27 MED ORDER — OXYCODONE HCL ER 15 MG PO T12A
15.0000 mg | EXTENDED_RELEASE_TABLET | Freq: Two times a day (BID) | ORAL | Status: DC
  Administered 2018-01-27 – 2018-01-30 (×7): 15 mg via ORAL
  Filled 2018-01-27 (×7): qty 1

## 2018-01-27 NOTE — Progress Notes (Signed)
Physical Therapy Session Note  Patient Details  Name: Jade Price MRN: [id number] Date of Birth: Oct 11, 1950  Today's Date: 01/27/2018 PT Individual Time: 7564-3329 PT Individual Time Calculation (min): 73 min   Short Term Goals: Week 1:  PT Short Term Goal 1 (Week 1): = LTG  Skilled Therapeutic Interventions/Progress Updates:   Pt received sitting on EOB following nurse-assisted transfer to the bathroom. Pt reports "25/10" pain and nurse states that she has taken all pain medications at this time. Pt dons pants with assistance from therapist to thread pant legs, then able to pull up with CGA in standing. Pt transfers bed<>w/c with CGA. Pt wheeled to gym for time management. Pt ambulates 50 ft with RW and states she feels "burning" pain in low back/buttocks during ambulation but does not worsen, rest break was provided. Pt performs seated LE exercises: marching, LAQ with 2# ankle weights, and hamstring curls with orange theraband. Pt performs standing tolerance and balance activity at high table with pipe tree design. Pt reports need to use bathroom, so therapist wheeled pt back to room and assisted in transfer to toilet. Pt voids bladder but unable to have bowel movement, performs self-hygiene tasks with supervision. Pt wheeled to day room and utilized kinetron while seated in w/c for LE strengthening. Pt performs w/c mobility 100 ft back to room with supervision. Pt left sitting up in w/c with chair alarm donned and all needs in reach.   Pt reports pain "has leveled off" at end of session.   Therapy Documentation Precautions:  Precautions Precautions: Fall, Back Precaution Booklet Issued: Yes (comment) Precaution Comments: handout provided previously, reviewed with patinet and required cueing to recall twisting  Required Braces or Orthoses: Spinal Brace Spinal Brace: Thoracolumbosacral orthotic Restrictions Weight Bearing Restrictions: No   Therapy/Group: Individual Therapy  Treyshon Buchanon 01/27/2018, 12:16 PM

## 2018-01-27 NOTE — Progress Notes (Signed)
Occupational Therapy Session Note  Patient Details  Name: Jade Price MRN: [id number] Date of Birth: 1950/02/20  Today's Date: 01/27/2018 OT Individual Time: 7846-9629 and 1300-1423 OT Individual Time Calculation (min): 32 min and 83 min   Short Term Goals: Week 1:  OT Short Term Goal 1 (Week 1): n/a d/t ELOS  Skilled Therapeutic Interventions/Progress Updates:    1) Treatment session with focus on LB dressing, toilet transfers, and education on energy conservation strategies.  Pt received upright in w/c reporting not feeling well.  Pt with c/o pain in back, premedicated, and LPN arrived during session to provide Maalox for heartburn.  Engaged in LB dressing with pt demonstrating ability to don and doff socks with use of reacher and sock aid.  Pt reports need to toilet.  Completed stand pivot transfers w/c <> toilet with CGA and completed toileting tasks including hygiene and clothing management with CGA.  Engaged in discussion regarding home making tasks as pt reports she typically did "everything" around the house and was starting to worry about her husband's ability to complete house hold tasks.  Educated on energy conservation strategies during IADLs and maintaining back precautions during functional tasks.  Pt left upright in w/c with seat belt alarm on and all needs in reach.  2) Treatment session with focus on ADL retraining and functional transfers.  Pt reports pain in back is "easing off", reporting 7/10.  Pt completed bed mobility with Min assist and cues to not utilize bed rails to simulate home environment.  Pt donned LSO and ambulated to bathroom with RW and CGA.  Pt completed bathing in shower with min cues for adherence to back precautions while engaging in education regarding use of AE and various technique to increase independence.  Pt able to complete dressing with setup assist and CGA when standing to pull underwear and pants over hips.  Pt utilized reacher and sockaid for LB  dressing.  Able to don LSO with setup assist.  Engaged in bed mobility and toilet transfers in ADL apt.  Educated on bed mobility with pt able to complete sit > sidelying with min assist and CGA for sidelying to sitting.  Discussed recommendation for 3 in 1 over toilet in home bathroom and grab bar for shower, with pt in agreement.  Returned to room and discussed recommendations with pt's husband and requested that he take some pictures of home bathroom.  Pt transferred back to bed as she reports fatigue and "overwhelmed".  LPN arriving as pt's husband with questions about lab work.    Therapy Documentation Precautions:  Precautions Precautions: Fall, Back Precaution Booklet Issued: Yes (comment) Precaution Comments: handout provided previously, reviewed with patinet and required cueing to recall twisting  Required Braces or Orthoses: Spinal Brace Spinal Brace: Thoracolumbosacral orthotic Restrictions Weight Bearing Restrictions: No Pain: Pain Assessment Pain Scale: 0-10 Pain Score: 8  Pain Location: Back Pain Orientation: Lower Pain Frequency: Constant Patients Stated Pain Goal: 6 Pain Intervention(s): Medication (See eMAR)   Therapy/Group: Individual Therapy  [name] 01/27/2018, 12:32 PM

## 2018-01-27 NOTE — Progress Notes (Signed)
[facility] PHYSICAL MEDICINE & REHABILITATION PROGRESS NOTE   Subjective/Complaints:  Slept poorly due to pain, 1 BM recorded , pt states she did not receive enema  ROS: Patient deniesCP, SOB, N/V/D   Objective:   No results found. Recent Labs    01/27/18 0802  WBC 9.5  HGB 8.2*  HCT 25.9*  PLT 554*   Recent Labs    01/27/18 0802  NA 133*  K 3.9  CL 100  CO2 26  GLUCOSE 125*  BUN 8  CREATININE 0.55  CALCIUM 8.2*    Intake/Output Summary (Last 24 hours) at 01/27/2018 0856 Last data filed at 01/26/2018 1900 Gross per 24 hour  Intake 360 ml  Output -  Net 360 ml     Physical Exam: Vital Signs Blood pressure 129/70, pulse (!) 107, temperature 98.5 F (36.9 C), temperature source Oral, resp. rate 15, weight 70.1 kg, SpO2 96 %. Constitutional: No distress . Vital signs reviewed. HEENT: EOMI, oral membranes moist Neck: supple Cardiovascular: RRR without murmur. No JVD    Respiratory: CTA Bilaterally without wheezes or rales. Normal effort    GI: BS +, non-tender, non-distended  Musculoskeletal:  General: No edema.  Comments: Low and mid back remain tender.  Appears more comfortable than yesterday Neurological: She is alertand oriented to person, place, and time. Nocranial nerve deficit. Patient is alert. She is a bit anxious. Follows full commands. Provides her name, age and date of birth. UE 4-5/5 in UE's. LE: 3+ HF, KE, 4/5 ADF/PF--- motor exam unchanged. No focal sensory deficits. DTR's 2+ LLE and 1+ RLE--- no changeskin: Skin iswarm. Noerythema. Back incision remains cdi Psychiatric: Pleasant and appropriate.    Assessment/Plan: 1. Functional deficits secondary to lumbar stenosis with radiculopathy/neurogenic claudication which require 3+ hours per day of interdisciplinary therapy in a comprehensive inpatient rehab setting.  Physiatrist is providing close team supervision and 24 hour management of active medical problems listed  below.  Physiatrist and rehab team continue to assess barriers to discharge/monitor patient progress toward functional and medical goals  Care Tool:  Bathing    Body parts bathed by patient: Right arm, Left arm, Chest, Abdomen, Front perineal area, Face, Left upper leg, Right upper leg   Body parts bathed by helper: Buttocks, Left lower leg, Right lower leg     Bathing assist Assist Level: Minimal Assistance - Patient > 75%     Upper Body Dressing/Undressing Upper body dressing   What is the patient wearing?: Pull over shirt    Upper body assist Assist Level: Set up assist    Lower Body Dressing/Undressing Lower body dressing      What is the patient wearing?: Pants, Underwear/pull up     Lower body assist Assist for lower body dressing: Moderate Assistance - Patient 50 - 74%     Toileting Toileting    Toileting assist Assist for toileting: Contact Guard/Touching assist     Transfers Chair/bed transfer  Transfers assist     Chair/bed transfer assist level: Minimal Assistance - Patient > 75%     Locomotion Ambulation   Ambulation assist      Assist level: Contact Guard/Touching assist Assistive device: Walker-rolling Max distance: 200 ft    Walk 10 feet activity   Assist     Assist level: Contact Guard/Touching assist Assistive device: Walker-rolling   Walk 50 feet activity   Assist Walk 50 feet with 2 turns activity did not occur: Safety/medical concerns  Assist level: Contact Guard/Touching assist Assistive device: Walker-rolling  Walk 150 feet activity   Assist Walk 150 feet activity did not occur: Safety/medical concerns  Assist level: Contact Guard/Touching assist Assistive device: Walker-rolling    Walk 10 feet on uneven surface  activity   Assist Walk 10 feet on uneven surfaces activity did not occur: Safety/medical concerns         Wheelchair     Assist Will patient use wheelchair at discharge?: Yes Type of  Wheelchair: Manual    Wheelchair assist level: Supervision/Verbal cueing Max wheelchair distance: 14450ft    Wheelchair 50 feet with 2 turns activity    Assist        Assist Level: Supervision/Verbal cueing   Wheelchair 150 feet activity     Assist     Assist Level: Supervision/Verbal cueing     Medical Problem List and Plan: 1.Decreased functional mobilitysecondary to Lumbar and thoracic spine spinal stenosis with radiculopathy status post L5-S1 anterior longitudinal interbody fusion as well as L1-L5 X LI F1 09/27/2018, T8 through ilium decompression and posterior spinal fusion 01/19/2018 --Continue CIR therapies including PT, OT - minA in therapy yesterday, tam conf in am 2. DVT Prophylaxis/Anticoagulation: SCDs. Check vascular study 3. Pain Management:using oxycodone 5-10mg  q3h prn -now off dilaudid -continue Robaxin and oxycodone needed. Increase Robaxin to 750 mg every 6 as needed -tolerated OxyCR 10mg  BID, will increase to 15mg  -kpad, ice    4. Mood:Ativan 0.5 milligrams every 4 hours as needed.Provide emotional support 5. Neuropsych: This patientiscapable of making decisions on herown behalf. 6. Skin/Wound Care:Routine skin checks, local care to back 7. Fluids/Electrolytes/Nutrition:encourage PO  -I personally reviewed the patient's labs today.    -potassium 3.1--- continue K. Dur 20meq daily 8. Acute blood loss anemia. hgb 8.1 1/18 (up from 7.8), stable at 8.2 on1/21  -Fe++ supplement  -recheck this wk 9. Constipation. Laxative assistance, no BM since admit, no result with dulc supp, will add fleets enema- pt states she did not receive, has BS, increase senna to 2 tab BID    LOS: 4 days A FACE TO FACE EVALUATION WAS PERFORMED  Erick Colacendrew E Jessikah Dicker 01/27/2018, 8:56 AM

## 2018-01-28 ENCOUNTER — Inpatient Hospital Stay (HOSPITAL_COMMUNITY): Payer: Medicare Other | Admitting: Physical Therapy

## 2018-01-28 ENCOUNTER — Inpatient Hospital Stay (HOSPITAL_COMMUNITY): Payer: Medicare Other

## 2018-01-28 ENCOUNTER — Inpatient Hospital Stay (HOSPITAL_COMMUNITY): Payer: Medicare Other | Admitting: *Deleted

## 2018-01-28 MED ORDER — FLEET ENEMA 7-19 GM/118ML RE ENEM
1.0000 | ENEMA | Freq: Once | RECTAL | Status: AC
  Administered 2018-01-28: 1 via RECTAL
  Filled 2018-01-28: qty 1

## 2018-01-28 MED ORDER — PREGABALIN 25 MG PO CAPS
25.0000 mg | ORAL_CAPSULE | Freq: Three times a day (TID) | ORAL | Status: DC
  Administered 2018-01-28 – 2018-01-29 (×4): 25 mg via ORAL
  Filled 2018-01-28 (×4): qty 1

## 2018-01-28 NOTE — Progress Notes (Signed)
Occupational Therapy Session Note  Patient Details  Name: Jade Price MRN: [id number] Date of Birth: 21-Apr-1950  Today's Date: 01/28/2018 OT Individual Time: 1105-1200 OT Individual Time Calculation (min): 55 min    Short Term Goals: Week 1:  OT Short Term Goal 1 (Week 1): n/a d/t ELOS  Skilled Therapeutic Interventions/Progress Updates:    1:1. Pt received in bed with RT present finishing session. Pt agreeable to shower. Pt completes supine>sitting with bed rail with supervision. Pt completes donning brace with S and transfers into bathroom shower with RW with S. Pt bathes sit to stand with S and VC for BLT precautions. Pt completes dressing in w/c at sink using reacher and sock aide to don LB clothing with set up. OT dons teds. Pt completes step over shower transfer using posterior method with CGA and RW to simulate home set up. OT will review TTB transfer at later session.  Therapy Documentation Precautions:  Precautions Precautions: Fall, Back Precaution Booklet Issued: Yes (comment) Precaution Comments: handout provided previously, reviewed with patinet and required cueing to recall twisting  Required Braces or Orthoses: Spinal Brace Spinal Brace: Thoracolumbosacral orthotic Restrictions Weight Bearing Restrictions: No General:   Vital Signs:  Pain: Pain Assessment Pain Score: 8  ADL: ADL Grooming: Setup Where Assessed-Grooming: Sitting at sink Upper Body Bathing: Setup Where Assessed-Upper Body Bathing: Shower Lower Body Bathing: Moderate assistance Where Assessed-Lower Body Bathing: Shower Upper Body Dressing: Setup Where Assessed-Upper Body Dressing: Edge of bed Lower Body Dressing: Maximal assistance Where Assessed-Lower Body Dressing: Edge of bed Toileting: Minimal assistance Toilet Transfer: Minimal assistance Toilet Transfer Method: Event organiser: Minimal assistance Film/video editor Method: Designer, industrial/product:  Information systems manager with back Clinical research associate   Exercises:   Other Treatments:     Therapy/Group: Individual Therapy  Shon Hale 01/28/2018, 12:06 PM

## 2018-01-28 NOTE — Progress Notes (Signed)
[facility] PHYSICAL MEDICINE & REHABILITATION PROGRESS NOTE   Subjective/Complaints: BM on 1/21 Back pain improved (a little)  L>R LE pain is keeping her up Did not take trazodone  Has Back pain radiating down LLE   ROS: Patient deniesCP, SOB, N/V/D   Objective:   No results found. Recent Labs    01/27/18 0802  WBC 9.5  HGB 8.2*  HCT 25.9*  PLT 554*   Recent Labs    01/27/18 0802  NA 133*  K 3.9  CL 100  CO2 26  GLUCOSE 125*  BUN 8  CREATININE 0.55  CALCIUM 8.2*    Intake/Output Summary (Last 24 hours) at 01/28/2018 8676 Last data filed at 01/27/2018 1750 Gross per 24 hour  Intake 564 ml  Output -  Net 564 ml     Physical Exam: Vital Signs Blood pressure (!) 147/74, pulse 96, temperature 97.7 F (36.5 C), temperature source Oral, resp. rate 16, weight 70.1 kg, SpO2 100 %. Constitutional: No distress . Vital signs reviewed. HEENT: EOMI, oral membranes moist Neck: supple Cardiovascular: RRR without murmur. No JVD    Respiratory: CTA Bilaterally without wheezes or rales. Normal effort    GI: BS +, non-tender, non-distended  Musculoskeletal:  General: No edema.  Comments: Low and mid back remain tender.  Appears more comfortable than yesterday Neurological: She is alertand oriented to person, place, and time. Nocranial nerve deficit. Patient is alert. She is a bit anxious. Follows full commands. Provides her name, age and date of birth. UE 4-5/5 in UE's. LE: 3+ HF, KE, 4/5 ADF/PF--- motor exam unchanged. No focal sensory deficits. DTR's 2+ LLE and 1+ RLE--- no changeskin: Skin iswarm. Noerythema. Back incision remains cdi Psychiatric: Pleasant and appropriate.    Assessment/Plan: 1. Functional deficits secondary to lumbar stenosis with radiculopathy/neurogenic claudication which require 3+ hours per day of interdisciplinary therapy in a comprehensive inpatient rehab setting.  Physiatrist is providing close team supervision and 24 hour  management of active medical problems listed below.  Physiatrist and rehab team continue to assess barriers to discharge/monitor patient progress toward functional and medical goals  Care Tool:  Bathing    Body parts bathed by patient: Right arm, Left arm, Chest, Abdomen, Front perineal area, Face, Left upper leg, Right upper leg, Right lower leg, Left lower leg   Body parts bathed by helper: Buttocks     Bathing assist Assist Level: Minimal Assistance - Patient > 75%     Upper Body Dressing/Undressing Upper body dressing   What is the patient wearing?: Pull over shirt    Upper body assist Assist Level: Set up assist    Lower Body Dressing/Undressing Lower body dressing      What is the patient wearing?: Pants, Underwear/pull up, Orthosis     Lower body assist Assist for lower body dressing: Supervision/Verbal cueing     Toileting Toileting    Toileting assist Assist for toileting: Contact Guard/Touching assist     Transfers Chair/bed transfer  Transfers assist     Chair/bed transfer assist level: Contact Guard/Touching assist     Locomotion Ambulation   Ambulation assist      Assist level: Contact Guard/Touching assist Assistive device: Walker-rolling Max distance: 50 ft   Walk 10 feet activity   Assist     Assist level: Contact Guard/Touching assist Assistive device: Walker-rolling   Walk 50 feet activity   Assist Walk 50 feet with 2 turns activity did not occur: Safety/medical concerns  Assist level: Contact Guard/Touching assist Assistive  device: Walker-rolling    Walk 150 feet activity   Assist Walk 150 feet activity did not occur: Safety/medical concerns  Assist level: Contact Guard/Touching assist Assistive device: Walker-rolling    Walk 10 feet on uneven surface  activity   Assist Walk 10 feet on uneven surfaces activity did not occur: Safety/medical concerns         Wheelchair     Assist Will patient use  wheelchair at discharge?: Yes Type of Wheelchair: Manual    Wheelchair assist level: Supervision/Verbal cueing Max wheelchair distance: 100 ft    Wheelchair 50 feet with 2 turns activity    Assist        Assist Level: Supervision/Verbal cueing   Wheelchair 150 feet activity     Assist     Assist Level: Supervision/Verbal cueing     Medical Problem List and Plan: 1.Decreased functional mobilitysecondary to Lumbar and thoracic spine spinal stenosis with radiculopathy status post L5-S1 anterior longitudinal interbody fusion as well as L1-L5 X LI F1 09/27/2018, T8 through ilium decompression and posterior spinal fusion 01/19/2018 --Continue CIR therapies including PT, OT - minA in therapy yesterday, Team conference today please see physician documentation under team conference tab, met with team face-to-face to discuss problems,progress, and goals. Formulized individual treatment plan based on medical history, underlying problem and comorbidities. 2. DVT Prophylaxis/Anticoagulation: SCDs. Check vascular study 3. Pain Management:using oxycodone 5-'10mg'$  q3h prn -now off dilaudid -continue Robaxin and oxycodone needed. Increase Robaxin to 750 mg every 6 as needed -tolerated OxyCR '10mg'$  BID, will increase to '15mg'$  Add Lyrica '25mg'$  TID for LE  Radicular pain-kpad, ice    4. Mood:Ativan 0.5 milligrams every 4 hours as needed.Provide emotional support 5. Neuropsych: This patientiscapable of making decisions on herown behalf. 6. Skin/Wound Care:Routine skin checks, local care to back 7. Fluids/Electrolytes/Nutrition:encourage PO  -I personally reviewed the patient's labs today.    -potassium 3.1--- continue K. Dur 88mq daily 8. Acute blood loss anemia. hgb 8.1 1/18 (up from 7.8), stable at 8.2 on1/21  -Fe++ supplement  -recheck this wk 9. Constipation. Laxative assistance, Good  BM 1/21 after fleets no  result with dulc supp, senna  2 tab BID    LOS: 5 days A FACE TO FACE EVALUATION WAS PERFORMED  ACharlett Blake1/22/2020, 8:08 AM

## 2018-01-28 NOTE — Plan of Care (Signed)
  Problem: Consults Goal: RH GENERAL PATIENT EDUCATION Description See Patient Education module for education specifics. Outcome: Progressing   Problem: RH BOWEL ELIMINATION Goal: RH STG MANAGE BOWEL WITH ASSISTANCE Description STG Manage Bowel with min  Assistance.  Outcome: Progressing Goal: RH STG MANAGE BOWEL W/MEDICATION W/ASSISTANCE Description STG Manage Bowel with Medication with min Assistance.  Outcome: Progressing   Problem: RH SKIN INTEGRITY Goal: RH STG MAINTAIN SKIN INTEGRITY WITH ASSISTANCE Description STG Maintain Skin Integrity With min  Assistance.  Outcome: Progressing   Problem: RH SAFETY Goal: RH STG ADHERE TO SAFETY PRECAUTIONS W/ASSISTANCE/DEVICE Description STG Adhere to Safety Precautions With min Assistance/Device.  Outcome: Progressing   Problem: RH KNOWLEDGE DEFICIT GENERAL Goal: RH STG INCREASE KNOWLEDGE OF SELF CARE AFTER HOSPITALIZATION Description Pt will be able to verbalize when to use soft lumbar corsett, be able to apply corsett without assistance.  Adhere to proper transfer techniques without cuing. Manage and maintain adequate pain control to perform daily activities.    Outcome: Progressing    Pain remains uncontrolled and at a 8-10, lyrica added today.

## 2018-01-28 NOTE — Patient Care Conference (Signed)
Inpatient RehabilitationTeam Conference and Plan of Care Update Date: 01/28/2018   Time: 10:50 AM    Patient Name: Jade Price      Medical Record Number: [id number]  Date of Birth: 1950-02-11 Sex: Female         Room/Bed: 4W26C/4W26C-01 Payor Info: Payor: MEDICARE / Plan: MEDICARE PART A AND B / Product Type: *No Product type* /    Admitting Diagnosis: lumbar decompression  Admit Date/Time:  01/23/2018  2:27 PM Admission Comments: No comment available   Primary Diagnosis:  <principal problem not specified> Principal Problem: <principal problem not specified>  Patient Active Problem List   Diagnosis Date Noted  . Radiculopathy 01/23/2018  . Status post surgery 01/15/2018  . Idiopathic scoliosis of thoracolumbar region 01/15/2018    Expected Discharge Date: Expected Discharge Date: 01/31/18  Team Members Present: Physician leading conference: Dr. Claudette Laws Social Worker Present: Dossie Der, LCSW Nurse Present: Mason Jim, RN PT Present: Aleda Grana, PT OT Present: [name], OT     Current Status/Progress Goal Weekly Team Focus  Medical   back pain control improving with increased oxyCR,   improve pain control, monitor anemia  increase pain meds   Bowel/Bladder   continent of bowel & bladder, LBM 01/27/18 after sorbitol, senna X4 & enema  remain continent  assist as needed   Swallow/Nutrition/ Hydration             ADL's   CGA transfers and dressing, Min A bathing  Mod I goals, may downgrade to supervision overall  ADL retraining while adhering to back precautions, dynamic standing balance, bathroom transfers, IADLs   Mobility   CGA overall with RW, CGA platform step negotiation with RW  supervision overall with LRAD  transfers with focus on car transfer, balance, strengthening, endurance, gait, stair negotiation, d/c planning, pt education   Communication             Safety/Cognition/ Behavioral Observations            Pain   c/o pain 8-10/10, takes  all pain meds around the clock, has oxycontin 15mg  scheduled, tylenol, oxycodone 10 mg Q 3 hrs, & robaxin 750mg  prn with no relief last night  pain scale <6/10  assess & treat as needed   Skin   3 incisions with honeycomb dressings intact  no signs of infection  assess q shift      *See Care Plan and progress notes for long and short-term goals.     Barriers to Discharge  Current Status/Progress Possible Resolutions Date Resolved   Physician    Medical stability;Wound Care     progressing with therapy  med changes start pregabalin      Nursing  Other (comments)(pain management)               PT  Other (comments)  pt with elevated SUV for transport home              OT                  SLP                SW                Discharge Planning/Teaching Needs:  HOme with husband who can assist and is here daily to participate in therapies. Pt is having much pain and feels if this could be managed she could do better in her therapies      Team Discussion:  Goals to be downgraded to CGA due to will not reach mod/i level. Back pain better but still having leg pain and not sleeping. MD working on pain meds started on lyrica and prescribing sleep meds. Nursing working on her bowel issues. Pain limits her in her therapies.  Revisions to Treatment Plan:  DC 1/25    Continued Need for Acute Rehabilitation Level of Care: The patient requires daily medical management by a physician with specialized training in physical medicine and rehabilitation for the following conditions: Daily direction of a multidisciplinary physical rehabilitation program to ensure safe treatment while eliciting the highest outcome that is of practical value to the patient.: Yes Daily medical management of patient stability for increased activity during participation in an intensive rehabilitation regime.: Yes Daily analysis of laboratory values and/or radiology reports with any subsequent need for medication adjustment  of medical intervention for : Neurological problems   I attest that I was present, lead the team conference, and concur with the assessment and plan of the team.   Lucy Chris 01/28/2018, 1:28 PM

## 2018-01-28 NOTE — Evaluation (Addendum)
Recreational Therapy Assessment and Plan  Patient Details  Name: Jade Price MRN: [id number] Date of Birth: 11/10/1950 Today's Date: 01/28/2018  Rehab Potential: Good ELOS: 10 days   Assessment  Problem List:      Patient Active Problem List   Diagnosis Date Noted  . Radiculopathy 01/23/2018  . Status post surgery 01/15/2018  . Idiopathic scoliosis of thoracolumbar region 01/15/2018    Past Medical History:      Past Medical History:  Diagnosis Date  . Anxiety    situitional  . Arthritis   . Back pain 02/2017  . Complication of anesthesia   . Degenerative lumbar spinal stenosis   . Disc displacement, lumbar   . GERD (gastroesophageal reflux disease)   . High cholesterol   . Hip pain 02/2017  . Leg pain    While walking  . Lumbar radiculopathy   . Osteopenia 10/01/2017  . PONV (postoperative nausea and vomiting)   . RLS (restless legs syndrome)   . Sciatic notch pain, right   . Scoliosis   . Spondylolisthesis    Lumber region  . Staph infection 2012   Finger   Past Surgical History:       Past Surgical History:  Procedure Laterality Date  . ABDOMINAL EXPOSURE N/A 01/15/2018   Procedure: ABDOMINAL EXPOSURE;  Surgeon: Serafina Mitchell, MD;  Location: [facility];  Service: Vascular;  Laterality: N/A;  . ANTERIOR LATERAL LUMBAR FUSION 4 LEVELS Right 01/15/2018   Procedure: Right Anterior Lateral Lumbar Interbody Fusion Lumbar One-Two, Two-Three, Three-Four, and Four-Five;  Surgeon: Erline Levine, MD;  Location: [facility];  Service: Neurosurgery;  Laterality: Right;  Right Anterior Lateral Lumbar Interbody Fusion Lumbar One-Two, Two-Three, Three-Four, and Four-Five   . ANTERIOR LUMBAR FUSION N/A 01/15/2018   Procedure: Lumbar Five to Sacral One  Anterior lumbar interbody fusion;  Surgeon: Erline Levine, MD;  Location: [facility];  Service: Neurosurgery;  Laterality: N/A;  Lumbar Five to Sacral One  Anterior lumbar interbody fusion  . APPLICATION OF INTRAOPERATIVE  CT SCAN N/A 01/19/2018   Procedure: APPLICATION OF INTRAOPERATIVE CT SCAN;  Surgeon: Erline Levine, MD;  Location: [facility];  Service: Neurosurgery;  Laterality: N/A;  . COLONOSCOPY    . FINGER SURGERY Right    infection  . POSTERIOR LUMBAR FUSION 4 LEVEL N/A 01/19/2018   Procedure: Thoracic Eight to pelvis fixation with Airo;  Surgeon: Erline Levine, MD;  Location: [facility];  Service: Neurosurgery;  Laterality: N/A;    Assessment & Plan Clinical Impression:Jade Price is a 68 year old right-handed female with history of hyperlipidemia, chronic pain. Per chart review she lives with her spouse who is a retired Engineer, drilling. Independent prior to admission. Multilevel home. Spouse uses a cane himself. Presented 01/15/2018 low back pain radiating to lower extremities. X-rays and imaging revealed spinal stenosis and scoliosis with foraminal stenosis of lateral listhesis L3-4 stenosis with radiculopathy. Plans were arranged for 2 part procedure and underwent L5-S1 anterior lumbar interbody fusion, L1-2, 2-3, 3-4, 4-5 anterior lateral lumbar interbody fusion 01/15/2018 per Dr. Vertell Limber as well as second part of fusion 01/19/2018. Hospital course pain management. Acute blood loss anemia 5.5 and transfuse 2 units pack red blood cells with latest hemoglobin 7.8. TLSO back brace when out of bed applied in sitting position. Therapy evaluations completed with recommendations of physical medicine rehabilitation consult. Patient was admitted for a comprehensive rehabilitation program.  Pt presents with decreased activity tolerance, decreased functional mobility, decreased balance, decreased safety awareness & difficulty maintaining precautions Limiting pt's independence with  leisure/community pursuits.   Leisure History/Participation Premorbid leisure interest/current participation: Sports - Exercise (Comment);Petra [place] - Flower gardening;Community - Shopping mall;Community - Travel (Comment);Community - Radiation protection practitioner Interests: Music (Comment) Other Leisure Interests: Television;Reading Leisure Participation Style: With Family/Friends Awareness of Community Resources: Good-identify 3 post discharge leisure resources Psychosocial / Spiritual Spiritual Interests: Church Social interaction - Mood/Behavior: Cooperative Academic librarian Appropriate for Education?: Yes Patient Agreeable to [employer]?: Yes Strengths/Weaknesses Patient Strengths/Abilities: Willingness to participate Patient weaknesses: Physical limitations TR Patient demonstrates impairments in the following area(s): Endurance;Motor;Safety;Pain  Plan Rec Therapy Plan Is patient appropriate for Therapeutic Recreation?: Yes Rehab Potential: Good Treatment times per week: Min 1 TR session >45 minutes for community reintegraiton during LOS Estimated Length of Stay: 10 days TR Treatment/Interventions: Adaptive equipment instruction;Balance/vestibular training;Community reintegration;Patient/family education;Therapeutic activities;Functional mobility training  Recommendations for other services: None   Discharge Criteria: Patient will be discharged from TR if patient refuses treatment 3 consecutive times without medical reason.  If treatment goals not met, if there is a change in medical status, if patient makes no progress towards goals or if patient is discharged from hospital.  The above assessment, treatment plan, treatment alternatives and goals were discussed and mutually agreed upon: by patient   Session notes: Session included education on relaxation training techniques (deep breathing, guided imagery), activity analysis with potential modifications and discussion on community reintegration.  Pt agreeable to participated in an outing to be scheduled tomorrow per team request.  Jade Price 01/28/2018, 3:17 PM

## 2018-01-28 NOTE — Progress Notes (Signed)
Social Work Patient ID: Jade Price, female   DOB: 10-27-1950, 68 y.o.   MRN: [id number] Met with pt and husband who is here to attend therapies with her to discuss team conference goals supervision level goals and target discharge 1/25. Still deciding about the equipment needs and prefer home health therapies to start off. Pt hopes her pain will get better and is working with MD on her pain meds. She realizes there will be pain but not so much she is immobile. Will work toward discharge Sat.

## 2018-01-28 NOTE — Progress Notes (Signed)
Occupational Therapy Session Note  Patient Details  Name: Jade Price MRN: [id number] Date of Birth: 1950-07-01  Today's Date: 01/28/2018 OT Individual Time: 0700-0755 OT Individual Time Calculation (min): 55 min    Short Term Goals: Week 1:  OT Short Term Goal 1 (Week 1): n/a d/t ELOS  Skilled Therapeutic Interventions/Progress Updates:    1:1. Pt received in bed reporting 10/10 pain. Rn alerted and delivers medication. Pt and OT discuss various alternative pain managemetn strategies such as guided imagery, meditation, and music. Pt reports "just wanting a pill to take the pain away so I can do my therapy." Pt completes supine>sitting EOB with bed rail use and CGA for trunk elevation. Pt requires cuing while supine/side lying for not twisting. Pt completes LB dressing EOB with reacher and sock aide with set up for threading BLE into pants and min guard for advancing pants past hips. OT educates on eating breakfast to coat stomach to keep pt from getting nauseas when taking medication. Pt grooms at sink with set up in w/c after transfer with RW and CGA.   Therapy Documentation Precautions:  Precautions Precautions: Fall, Back Precaution Booklet Issued: Yes (comment) Precaution Comments: handout provided previously, reviewed with patinet and required cueing to recall twisting  Required Braces or Orthoses: Spinal Brace Spinal Brace: Thoracolumbosacral orthotic Restrictions Weight Bearing Restrictions: No General:   Vital Signs: Therapy Vitals Temp: 97.7 F (36.5 C) Temp Source: Oral Pulse Rate: 96 Resp: 16 BP: (!) 147/74 Patient Position (if appropriate): Lying Oxygen Therapy SpO2: 100 % O2 Device: Room Air Pain: Pain Assessment Pain Scale: 0-10 Pain Score: 10-Worst pain ever ADL: ADL Grooming: Setup Where Assessed-Grooming: Sitting at sink Upper Body Bathing: Setup Where Assessed-Upper Body Bathing: Shower Lower Body Bathing: Moderate assistance Where Assessed-Lower  Body Bathing: Shower Upper Body Dressing: Setup Where Assessed-Upper Body Dressing: Edge of bed Lower Body Dressing: Maximal assistance Where Assessed-Lower Body Dressing: Edge of bed Toileting: Minimal assistance Toilet Transfer: Minimal assistance Toilet Transfer Method: Event organiser: Minimal assistance Film/video editor Method: Designer, industrial/product: Information systems manager with back Clinical research associate   Exercises:   Other Treatments:     Therapy/Group: Individual Therapy  Shon Hale 01/28/2018, 7:47 AM

## 2018-01-28 NOTE — Progress Notes (Signed)
Occupational Therapy Session Note  Patient Details  Name: Jade Price MRN: [id number] Date of Birth: 15-Feb-1950  Today's Date: 01/28/2018 OT Individual Time: 1300-1330 OT Individual Time Calculation (min): 30 min    Short Term Goals: Week 1:  OT Short Term Goal 1 (Week 1): n/a d/t ELOS  Skilled Therapeutic Interventions/Progress Updates:    1:1. Pt received asleep in bed with husband present. OT and husband discuss shower set up as well as option to use tub. OT questions if built in shower seat is too low for pt to power up on if pt does not have any grab bars. Husband to measure height of shower seat as well as width between seat and small shelf to see if a shower stool would fit. Pt practices TTB transfer and happy with this as an alternative options as pt completes with supervision. Husband will purchase hand held shower head and install prior to d/c. Exied session with pt seated in bed, call light in reach and all needs met  Therapy Documentation Precautions:  Precautions Precautions: Fall, Back Precaution Booklet Issued: Yes (comment) Precaution Comments: handout provided previously, reviewed with patinet and required cueing to recall twisting  Required Braces or Orthoses: Spinal Brace Spinal Brace: Thoracolumbosacral orthotic Restrictions Weight Bearing Restrictions: No General:   Vital Signs:  Pain: Pain Assessment Pain Score: 8     Therapy/Group: Individual Therapy  Tonny Branch 01/28/2018, 1:37 PM

## 2018-01-28 NOTE — Progress Notes (Signed)
Physical Therapy Session Note  Patient Details  Name: Jade Price MRN: [id number] Date of Birth: [birth year]  Today's Date: 01/28/2018 PT Individual Time: 7915-0569 PT Individual Time Calculation (min): 46 min   Short Term Goals: Week 1:  PT Short Term Goal 1 (Week 1): = LTG  Skilled Therapeutic Interventions/Progress Updates:  Pt received in bed, initially declining therapy but then agreeable. Pt reports pain is 4/10 in her back & they are adjusting her meds today; pt also reports feeling "fuzzy headed" since change in meds & PA Jesusita Oka) made aware. Pt transfers to EOB without bed features, maintaining back precautions with occasional cuing. Pt transfers sit>stand and ambulates to w/c with supervision & RW. Pt propels w/c room>gym with BUE & supervision for cardiopulmonary endurance training. Pt ambulates 200 ft with RW & supervision with significantly decreased gait speed and cuing for upright posture. Pt negotiates obstacle course, weaving between cones, gait over uneven surface, side stepping with RW between narrow spaces, and stepping over poles with RW & supervision with instructional cuing for negotiating poles & would benefit from further practice/education. Pt ambulates gym>apartment with RW & supervision and completes bed mobility in apartment with min assist and max cuing for sit>sidelying with max cuing to maintain back precautions. Pt reports need to use restroom and is assisted to EOB and to w/c & returned to room dependent assist 2/2 urgency. Pt left in handoff to RN, ambulating into bathroom.  Pt able to recall back precautions & how to maintain them in functional context (need to use reacher to obtain objects from floor). Pt donned LSO at beginning of session with set up assist.   Therapy Documentation Precautions:  Precautions Precautions: Fall, Back Precaution Booklet Issued: Yes (comment) Precaution Comments: handout provided previously, reviewed with patinet and required cueing  to recall twisting  Required Braces or Orthoses: Spinal Brace Spinal Brace: Thoracolumbosacral orthotic Restrictions Weight Bearing Restrictions: No     Therapy/Group: Individual Therapy  Sandi Mariscal 01/28/2018, 3:24 PM

## 2018-01-29 ENCOUNTER — Inpatient Hospital Stay (HOSPITAL_COMMUNITY): Payer: Medicare Other | Admitting: Occupational Therapy

## 2018-01-29 ENCOUNTER — Inpatient Hospital Stay (HOSPITAL_COMMUNITY): Payer: Medicare Other

## 2018-01-29 ENCOUNTER — Encounter (HOSPITAL_COMMUNITY): Payer: Medicare Other | Admitting: *Deleted

## 2018-01-29 MED ORDER — PREGABALIN 50 MG PO CAPS
50.0000 mg | ORAL_CAPSULE | Freq: Three times a day (TID) | ORAL | Status: DC
  Administered 2018-01-29 – 2018-01-30 (×5): 50 mg via ORAL
  Filled 2018-01-29 (×5): qty 1

## 2018-01-29 NOTE — Progress Notes (Signed)
Occupational Therapy Session Note  Patient Details  Name: Jade Price MRN: [id number] Date of Birth: 01/17/1950  Today's Date: 01/29/2018 OT Individual Time: 1400-1445 OT Individual Time Calculation (min): 45 min    Short Term Goals: Week 1:  OT Short Term Goal 1 (Week 1): n/a d/t ELOS  Skilled Therapeutic Interventions/Progress Updates:    Patient asleep upon arrival in bed but easily aroused and ready for therapy.  Husband present for session.  She states that her pain is limiting her sleep but under control at this time.  Dons TLSO with set up assist, UB dressing with set up, LB dressing set up with reacher.  Ambulation to bathroom with RW CG A, toileting CS. Reviewed DME options with patient and husband.  They decided on tub transfer bench for tub / shower in home environment as there is more space, commode and plan to purchase a hand held shower.  Practiced SPT with RW to/from tub bench and apartment bed, bed mobility and reviewed options to achieve an optimal height for the bed at home.  Husband and patient demonstrate a good understanding of needs.  Patient transfer back to bed at close of session with CG A, bed alarm set and call bell in reach.  Patient states that she is feeling ready to go home and pleased with progress.    Therapy Documentation Precautions:  Precautions Precautions: Fall, Back Precaution Booklet Issued: Yes (comment) Precaution Comments: handout provided previously, reviewed with patinet and required cueing to recall twisting  Required Braces or Orthoses: Spinal Brace Spinal Brace: Thoracolumbosacral orthotic Restrictions Weight Bearing Restrictions: No General:   Vital Signs:  Pain: Pain Assessment Pain Scale: 0-10 Pain Score: 3  Pain Intervention(s): Repositioned;Ambulation/increased activity   Other Treatments:     Therapy/Group: Individual Therapy  Barrie LymeStacey A Renika Shiflet 01/29/2018, 3:45 PM

## 2018-01-29 NOTE — Progress Notes (Signed)
Physical Therapy Session Note  Patient Details  Name: Jade Price MRN: [id number] Date of Birth: 1950-03-08  Today's Date: 01/29/2018 PT Individual Time: 0830-0930 PT Individual Time Calculation (min): 60 min   Short Term Goals: Week 1:  PT Short Term Goal 1 (Week 1): = LTG  Skilled Therapeutic Interventions/Progress Updates:    Patient in supine reports did not rest well overnight due to restless legs.  Reports plans to speak to MD and ask for medication adjustment.  Patient rolled to L and side to sit with S with rail and increased time/effort.  Donned brace S.  Patient sit to stand to RW minguard A and ambulated to bathroom minguard A.  Toileted with S.  Able to sit in w/c and wash face & hands/dress upper and lower body  with AE and S/set up.  RN in to deliver meds and pt ate muffin while reviewing precautions/plans for outing. Pushed in w/c to ADL apartment per request due to pain.  Patient transferred to stand minguard A and ambulated 40' to practice bed mobility on tall bed in ADL apt without rails.  Patient relates she has a toddler rail she has used for grandkids in the past and plans to have spouse fix on her bed at home.  Performed sit>sidelying min a for LE per pt needing help due to L leg weakness from surgery.  Educated about leg lifter and demonstrated with pt and she performed sit to side with S using leg lifter.  Side to sit with S and increased time, cues for hand placement.  Discussed using possibly gait belt from safety packet and fashioned it in room to use next session.  Patient left in w/c in room with alarm belt and call bell in reach.  Therapy Documentation Precautions:  Precautions Precautions: Fall, Back Precaution Booklet Issued: Yes (comment) Precaution Comments: handout provided previously, reviewed with patinet and required cueing to recall twisting  Required Braces or Orthoses: Spinal Brace Spinal Brace: Thoracolumbosacral orthotic Restrictions Weight Bearing  Restrictions: No Pain: Pain Assessment Pain Score: 9  Pain Location: Back Pain Descriptors / Indicators: Aching;Discomfort Pain Onset: On-going Pain Intervention(s): Repositioned;Ambulation/increased activity 2nd Pain Site Wong-Baker Pain Rating: Hurts whole lot Pain Type: Chronic pain Pain Location: Leg Pain Orientation: Right;Left Pain Descriptors / Indicators: Aching;Discomfort Pain Onset: On-going Pain Intervention(s): Ambulation/increased activity;Repositioned    Therapy/Group: Individual Therapy  Elray Mcgregor  [place], [place] [phone] 01/29/2018  01/29/2018, 9:01 AM

## 2018-01-29 NOTE — Progress Notes (Signed)
Occupational Therapy Session Note  Patient Details  Name: Jade Price MRN: [id number] Date of Birth: 1950-03-18  Today's Date: 01/29/2018 OT Concurrent Time: 1030-1200 OT Concurrent Time Calculation (min): 90 min   Short Term Goals: Week 1:  OT Short Term Goal 1 (Week 1): n/a d/t ELOS  Skilled Therapeutic Interventions/Progress Updates:    Engaged in [employer] reintegration/outing with TR with focus on functional mobility, energy conservation, problem solving accessibility/barriers in community, and adherence to back precautions in community reintegration.  Pt completed van steps with min guard and cues initially faded to supervision at end of session.  Pt completed mobility on uneven surfaces, curb cutout, and curb step with RW with supervision.  Pt able to identify barriers of accessibility in community setting, however able to problem solve each.  Pt demonstrated good awareness of back precautions during mobility, even to low couch surface with supervision.  Pt appreciative of outing and reports increased awareness in preparation for d/c and returning to leisure tasks.   Therapy Documentation Precautions:  Precautions Precautions: Fall, Back Precaution Booklet Issued: Yes (comment) Precaution Comments: handout provided previously, reviewed with patinet and required cueing to recall twisting  Required Braces or Orthoses: Spinal Brace Spinal Brace: Thoracolumbosacral orthotic Restrictions Weight Bearing Restrictions: No Pain: Pain Assessment Pain Score: 9  Pain Location: Back Pain Descriptors / Indicators: Aching;Discomfort Pain Onset: On-going Pain Intervention(s): Repositioned;Ambulation/increased activity;Premedicated   Therapy/Group: Concurrent/Outing  [name] 01/29/2018, 12:29 PM

## 2018-01-29 NOTE — Progress Notes (Signed)
Recreational Therapy Discharge Summary Patient Details  Name: Jarely Juncaj MRN: [id number] Date of Birth: 28-Jul-1950 Today's Date: 01/29/2018  Long term goals set: 1  Long term goals met: 1  Comments on progress toward goals: Pt has made good progress during LOS and is ready for discharge home with husband at overall supervision ambulatory level for community pursuits.  Sessions/education focused on safe community mobility, identification and negotiation of obstacles, energy conservation techniques, relaxation training & activity analysis with potential modifications.  Goal met  Reasons for discharge: discharge from hospital Patient/family agrees with progress made and goals achieved: Yes  Moe Brier 01/29/2018, 12:43 PM

## 2018-01-29 NOTE — Progress Notes (Signed)
Recreational Therapy Session Note  Patient Details  Name: Jade Price MRN: [id number] Date of Birth: 07-Oct-1950 Today's Date: 01/29/2018 Time:  1030-1210 Pain: premedicated & agreeable to participate Skilled Therapeutic Interventions/Progress Updates: Pt participated in community reintegration/outing to [employer] at overall supervision ambulatory level using RW.  Goals focused on safe community mobility, identification & negotiation of obstacles, accessing public restroom, energy conservation techniques/education.  See outing goal sheet in shadow chart for full details.   Therapy/Group: [employer]   Nea Gittens 01/29/2018, 10:01 AM

## 2018-01-29 NOTE — Progress Notes (Signed)
Jade Price PHYSICAL MEDICINE & REHABILITATION PROGRESS NOTE   Subjective/Complaints:  Slept poorly due to "restless legs" back pain overall improved.  Patient reports taking an extra dose of ropiniriol as needed at home if she is awakened by restless legs patient did take trazodone last night   ROS: Patient deniesCP, SOB, N/V/D   Objective:   No results found. Recent Labs    01/27/18 0802  WBC 9.5  HGB 8.2*  HCT 25.9*  PLT 554*   Recent Labs    01/27/18 0802  NA 133*  K 3.9  CL 100  CO2 26  GLUCOSE 125*  BUN 8  CREATININE 0.55  CALCIUM 8.2*    Intake/Output Summary (Last 24 hours) at 01/29/2018 1607 Last data filed at 01/29/2018 1253 Gross per 24 hour  Intake 730 ml  Output 127 ml  Net 603 ml     Physical Exam: Vital Signs Blood pressure 113/70, pulse (!) 109, temperature 98.4 F (36.9 C), temperature source Oral, resp. rate 16, height 5\' 5"  (1.651 m), weight 70.1 kg, SpO2 100 %. Constitutional: No distress . Vital signs reviewed. HEENT: EOMI, oral membranes moist Neck: supple Cardiovascular: RRR without murmur. No JVD    Respiratory: CTA Bilaterally without wheezes or rales. Normal effort    GI: BS +, non-tender, non-distended  Musculoskeletal:  General: No edema.  Comments: Low and mid back remain tender.  Appears more comfortable than yesterday Neurological: She is alertand oriented to person, place, and time. Nocranial nerve deficit. Patient is alert. She is a bit anxious. Follows full commands. Provides her name, age and date of birth. UE 4-5/5 in UE's. LE: 3+ HF, KE, 4/5 ADF/PF--- motor exam unchanged. No focal sensory deficits. DTR's 2+ LLE and 1+ RLE--- no changeskin: Skin iswarm. Noerythema. Back incision remains cdi Psychiatric: Pleasant and appropriate.    Assessment/Plan: 1. Functional deficits secondary to lumbar stenosis with radiculopathy/neurogenic claudication which require 3+ hours per day of interdisciplinary therapy  in a comprehensive inpatient rehab setting.  Physiatrist is providing close team supervision and 24 hour management of active medical problems listed below.  Physiatrist and rehab team continue to assess barriers to discharge/monitor patient progress toward functional and medical goals  Care Tool:  Bathing    Body parts bathed by patient: Right arm, Left arm, Chest, Abdomen, Front perineal area, Face, Left upper leg, Right upper leg, Right lower leg, Left lower leg   Body parts bathed by helper: Buttocks     Bathing assist Assist Level: Minimal Assistance - Patient > 75%     Upper Body Dressing/Undressing Upper body dressing   What is the patient wearing?: Pull over shirt, Orthosis    Upper body assist Assist Level: Set up assist    Lower Body Dressing/Undressing Lower body dressing      What is the patient wearing?: Pants, Underwear/pull up, Orthosis     Lower body assist Assist for lower body dressing: Supervision/Verbal cueing     Toileting Toileting    Toileting assist Assist for toileting: Supervision/Verbal cueing     Transfers Chair/bed transfer  Transfers assist     Chair/bed transfer assist level: Supervision/Verbal cueing     Locomotion Ambulation   Ambulation assist      Assist level: Contact Guard/Touching assist Assistive device: Walker-rolling Max distance: 40'   Walk 10 feet activity   Assist     Assist level: Contact Guard/Touching assist Assistive device: Walker-rolling   Walk 50 feet activity   Assist Walk 50 feet with 2  turns activity did not occur: Safety/medical concerns  Assist level: Supervision/Verbal cueing Assistive device: Walker-rolling    Walk 150 feet activity   Assist Walk 150 feet activity did not occur: Safety/medical concerns  Assist level: Supervision/Verbal cueing Assistive device: Walker-rolling    Walk 10 feet on uneven surface  activity   Assist Walk 10 feet on uneven surfaces activity  did not occur: Safety/medical concerns   Assist level: Supervision/Verbal cueing Assistive device: PhotographerWalker-rolling   Wheelchair     Assist Will patient use wheelchair at discharge?: Yes Type of Wheelchair: Manual    Wheelchair assist level: Supervision/Verbal cueing Max wheelchair distance: 150 ft     Wheelchair 50 feet with 2 turns activity    Assist        Assist Level: Supervision/Verbal cueing   Wheelchair 150 feet activity     Assist     Assist Level: Supervision/Verbal cueing     Medical Problem List and Plan: 1.Decreased functional mobilitysecondary to Lumbar and thoracic spine spinal stenosis with radiculopathy status post L5-S1 anterior longitudinal interbody fusion as well as L1-L5 X LI F1 09/27/2018, T8 through ilium decompression and posterior spinal fusion 01/19/2018 --Continue CIR therapies including PT, OT -tentative discharge 01/31/2018 2. DVT Prophylaxis/Anticoagulation: SCDs. Check vascular study 3. Pain Management:using oxycodone 5-10mg  q3h prn -now off dilaudid -continue Robaxin and oxycodone needed. Increase Robaxin to 750 mg every 6 as needed -cont OxyCR 15mg  BID,  Increase Lyrica 50mg  TID for LE  Radicular pain-kpad, ice    4. Mood:Ativan 0.5 milligrams every 4 hours as needed.Provide emotional support 5. Neuropsych: This patientiscapable of making decisions on herown behalf. 6. Skin/Wound Care:Routine skin checks, local care to back 7. Fluids/Electrolytes/Nutrition:encourage PO  -I personally reviewed the patient's labs today.    -potassium 3.1--- continue K. Dur 20meq daily 8. Acute blood loss anemia. hgb 8.1 1/18 (up from 7.8), stable at 8.2 on1/21  -Fe++ supplement  -recheck this wk 9. Constipation. Laxative assistance, Good  BM 1/21 and 1/22 after fleets no result with dulc supp, senna  2 tab BID, as discussed with patient recommend repeat fleets enema on  01/29/2018  LOS: 6 days A FACE TO FACE EVALUATION WAS PERFORMED  Erick Colacendrew E Kaylaann Mountz 01/29/2018, 4:07 PM

## 2018-01-30 ENCOUNTER — Inpatient Hospital Stay (HOSPITAL_COMMUNITY): Payer: Medicare Other | Admitting: Occupational Therapy

## 2018-01-30 ENCOUNTER — Inpatient Hospital Stay (HOSPITAL_COMMUNITY): Payer: Medicare Other | Admitting: Physical Therapy

## 2018-01-30 ENCOUNTER — Inpatient Hospital Stay (HOSPITAL_COMMUNITY): Payer: Medicare Other

## 2018-01-30 MED ORDER — OXYCODONE HCL 5 MG PO TABS: 5.0000 mg | ORAL_TABLET | ORAL | 0 refills | Status: DC | PRN

## 2018-01-30 MED ORDER — SENNOSIDES-DOCUSATE SODIUM 8.6-50 MG PO TABS: 2.0000 | ORAL_TABLET | Freq: Two times a day (BID) | ORAL | Status: DC

## 2018-01-30 MED ORDER — ACETAMINOPHEN 325 MG PO TABS: 650.0000 mg | ORAL_TABLET | ORAL | Status: AC | PRN

## 2018-01-30 MED ORDER — ROPINIROLE HCL 4 MG PO TABS: 4.0000 mg | ORAL_TABLET | Freq: Every day | ORAL | 0 refills | Status: DC

## 2018-01-30 MED ORDER — OXYCODONE HCL 5 MG PO TABS
5.0000 mg | ORAL_TABLET | ORAL | Status: DC | PRN
  Administered 2018-01-30 (×2): 10 mg via ORAL
  Filled 2018-01-30 (×2): qty 2

## 2018-01-30 MED ORDER — LORAZEPAM 0.5 MG PO TABS: 0.5000 mg | ORAL_TABLET | ORAL | 0 refills | Status: DC | PRN

## 2018-01-30 MED ORDER — METHOCARBAMOL 750 MG PO TABS: 750.0000 mg | ORAL_TABLET | Freq: Four times a day (QID) | ORAL | 0 refills | Status: DC | PRN

## 2018-01-30 MED ORDER — OXYCODONE HCL ER 15 MG PO T12A: 15.0000 mg | EXTENDED_RELEASE_TABLET | Freq: Two times a day (BID) | ORAL | 0 refills | Status: DC

## 2018-01-30 MED ORDER — PREGABALIN 50 MG PO CAPS: 50.0000 mg | ORAL_CAPSULE | Freq: Three times a day (TID) | ORAL | 0 refills | Status: DC

## 2018-01-30 MED ORDER — FERROUS SULFATE 325 (65 FE) MG PO TABS: 325.0000 mg | ORAL_TABLET | Freq: Two times a day (BID) | ORAL | 3 refills | Status: AC

## 2018-01-30 NOTE — Progress Notes (Signed)
Social Work  Discharge Note  The overall goal for the admission was met for DC  Discharge location: Yes-HOME WITH HUSBAND WHO CAN PROVIDE 24 HR SUPERVISION  Length of Stay: Yes-8 DAYS  Discharge activity level: Yes-SUPERVISION WITH DEVICE  Home/community participation: Yes  Services provided included: MD, RD, PT, OT, RN, CM, TR, Pharmacy and SW  Financial Services: Medicare and Private Insurance: [place]  Follow-up services arranged: Home Health: [facility], DME: [facility], 3 IN 1, & TUB [facility]CH and Patient/Family has no preference for HH/DME agencies  Comments (or additional information):[facility] WAS [place] UP  Patient/Family verbalized understanding of follow-up arrangements: Yes  Individual responsible for coordination of the follow-up plan: SELF & HUSBAND  Confirmed correct DME delivered: Elease Hashimoto 01/30/2018    Elease Hashimoto

## 2018-01-30 NOTE — Discharge Instructions (Signed)
Inpatient Rehab Discharge Instructions  Jade Price Discharge date and time: No discharge date for patient encounter.   Activities/Precautions/ Functional Status: Activity: back brace when out of bed Diet: regular diet Wound Care: keep wound clean and dry Functional status:  ___ No restrictions     ___ Walk up steps independently ___ 24/7 supervision/assistance   ___ Walk up steps with assistance ___ Intermittent supervision/assistance  ___ Bathe/dress independently ___ Walk with walker     _x__ Bathe/dress with assistance ___ Walk Independently    ___ Shower independently ___ Walk with assistance    ___ Shower with assistance ___ No alcohol     ___ Return to work/school ________  Special Instructions: No driving   COMMUNITY REFERRALS UPON DISCHARGE:    Home Health:   PT & OT  Agency:[facility]   Phone:[phone]   Date of last service:01/31/2018  Medical Equipment/Items Ordered:ROLLING WALKER, 3 IN 1, & TUB BENCH  Agency/Supplier:[facility]   [phone]   My questions have been answered and I understand these instructions. I will adhere to these goals and the provided educational materials after my discharge from the hospital.  Patient/Caregiver Signature _______________________________ Date __________  Clinician Signature _______________________________________ Date __________  Please bring this form and your medication list with you to all your follow-up doctor's appointments.

## 2018-01-30 NOTE — Progress Notes (Signed)
Physical Therapy Discharge Summary  Patient Details  Name: Jade Price MRN: [id number] Date of Birth: 1950-08-06  Today's Date: 01/30/2018   Patient has met 8 of 8 long term goals due to improved activity tolerance, improved balance, improved postural control, increased strength, ability to compensate for deficits, improved attention, improved awareness and improved coordination.  Patient to discharge at an ambulatory level supervision with RW.   Patient's husband was not present for hands on training but has been verbally educated on pt discharging at supervision level with RW, need to don TLSO sitting EOB, need to ensure all pathways are clear to reduce tripping hazards, and recommendations of f/u HHPT.  Reasons goals not met: n/a  Recommendation:  Patient will benefit from ongoing skilled PT services in home health setting to continue to advance safe functional mobility, address ongoing impairments in balance, endurance, strength, progress gait with LRAD, and minimize fall risk.  Equipment: RW  Reasons for discharge: treatment goals met and discharge from hospital  Patient/family agrees with progress made and goals achieved: Yes  PT Discharge Precautions/Restrictions Precautions Precautions: Fall;Back Required Braces or Orthoses: Spinal Brace Spinal Brace: Thoracolumbosacral orthotic;Applied in sitting position Restrictions Weight Bearing Restrictions: No  Vision/Perception  Pt wears glasses for reading only at baseline.  No change in baseline vision. No apparent visual deficits.   Cognition Overall Cognitive Status: Within Functional Limits for tasks assessed Orientation Level: Oriented X4  Sensation Sensation Light Touch: Appears Intact Proprioception: Appears Intact Coordination Gross Motor Movements are Fluid and Coordinated: Yes Fine Motor Movements are Fluid and Coordinated: Yes  Motor  Motor Motor: Abnormal postural alignment and control Motor - Discharge  Observations: generalized weakness & generalized deconditioning   Mobility Bed Mobility Bed Mobility: Rolling Right;Rolling Left;Sit to Sidelying Right;Right Sidelying to Sit Rolling Right: Supervision/verbal cueing Rolling Left: Supervision/Verbal cueing Right Sidelying to Sit: Supervision/Verbal cueing Sit to Sidelying Right: Supervision/Verbal cueing(leg lifter) Transfers Transfers: Sit to Stand;Stand to Sit Sit to Stand: Supervision/Verbal cueing Stand to Sit: Supervision/Verbal cueing  Locomotion  Gait Ambulation: Yes Gait Assistance: Supervision/Verbal cueing Gait Distance (Feet): 150 Feet Assistive device: Rolling walker Gait Assistance Details: (verbal cuing for upright posture) Gait Gait: Yes Gait Pattern: (forward trunk flexion, decreased step & stride length) Gait velocity: decreased Stairs / Additional Locomotion Stairs: Yes Stairs Assistance: Supervision/Verbal cueing Stair Management Technique: Two rails Number of Stairs: 12 Height of Stairs: (6" + 3") Ramp: Supervision/Verbal cueing(ambulatory with RW) Curb: Supervision/Verbal cueing(pt negotiates 3" platform steps x 2 with RW & supervision) Wheelchair Mobility Wheelchair Mobility: No   Trunk/Postural Assessment  Cervical Assessment Cervical Assessment: Within Functional Limits Thoracic Assessment Thoracic Assessment: Exceptions to WFL(TLSO) Lumbar Assessment Lumbar Assessment: Exceptions to WFL(TLSO) Postural Control Postural Control: Deficits on evaluation Righting Reactions: delayed   Balance Balance Balance Assessed: Yes Static Standing Balance Static Standing - Balance Support: No upper extremity supported;During functional activity(hand hygiene) Static Standing - Level of Assistance: 5: Stand by assistance Dynamic Standing Balance Dynamic Standing - Balance Support: During functional activity;Bilateral upper extremity supported(during gait with RW) Dynamic Standing - Level of Assistance: 5:  Stand by assistance   Extremity Assessment  RUE Assessment RUE Assessment: Within Functional Limits LUE Assessment LUE Assessment: Within Functional Limits RLE Assessment RLE Assessment: Within Functional Limits LLE Assessment LLE Assessment: Within Functional Limits BLE not formally manual muscle tested, no buckling noted in standing with/without BUE support, pt reports RLE feels weaker than LLE.  Lavone Nian, PT, DPT 01/30/2018, 12:43 PM  Waunita Schooner 01/30/2018, 12:43 PM

## 2018-01-30 NOTE — Progress Notes (Addendum)
[place] PHYSICAL MEDICINE & REHABILITATION PROGRESS NOTE   Subjective/Complaints: DId not take her extra dose of ropinirole, restless legs kept her up last noc   ROS: Patient deniesCP, SOB, N/V/D   Objective:   No results found. No results for input(s): WBC, HGB, HCT, PLT in the last 72 hours. No results for input(s): NA, K, CL, CO2, GLUCOSE, BUN, CREATININE, CALCIUM in the last 72 hours.  Intake/Output Summary (Last 24 hours) at 01/30/2018 0903 Last data filed at 01/29/2018 1253 Gross per 24 hour  Intake 240 ml  Output -  Net 240 ml     Physical Exam: Vital Signs Blood pressure 125/70, pulse 85, temperature 98.4 F (36.9 C), temperature source Oral, resp. rate 16, height 5\' 5"  (1.651 m), weight 70.1 kg, SpO2 100 %. Constitutional: No distress . Vital signs reviewed. HEENT: EOMI, oral membranes moist Neck: supple Cardiovascular: RRR without murmur. No JVD    Respiratory: CTA Bilaterally without wheezes or rales. Normal effort    GI: BS +, non-tender, non-distended  Musculoskeletal:  General: No edema.  Comments: Low and mid back remain tender.  Appears more comfortable than yesterday Neurological: She is alertand oriented to person, place, and time. Nocranial nerve deficit. Patient is alert. She is a bit anxious. Follows full commands. Provides her name, age and date of birth. UE 4-5/5 in UE's. LE: 3+ HF, KE, 4/5 ADF/PF--- motor exam unchanged. No focal sensory deficits. DTR's 2+ LLE and 1+ RLE--- no changeskin: Skin iswarm. Noerythema. Back incision remains cdi Psychiatric: Pleasant and appropriate.    Assessment/Plan: 1. Functional deficits secondary to lumbar stenosis with radiculopathy/neurogenic claudication which require 3+ hours per day of interdisciplinary therapy in a comprehensive inpatient rehab setting.  Physiatrist is providing close team supervision and 24 hour management of active medical problems listed below.  Physiatrist and rehab  team continue to assess barriers to discharge/monitor patient progress toward functional and medical goals  Care Tool:  Bathing    Body parts bathed by patient: Right arm, Left arm, Chest, Abdomen, Front perineal area, Face, Left upper leg, Right upper leg, Right lower leg, Left lower leg   Body parts bathed by helper: Buttocks     Bathing assist Assist Level: Minimal Assistance - Patient > 75%     Upper Body Dressing/Undressing Upper body dressing   What is the patient wearing?: Pull over shirt, Orthosis    Upper body assist Assist Level: Set up assist    Lower Body Dressing/Undressing Lower body dressing      What is the patient wearing?: Pants, Underwear/pull up, Orthosis     Lower body assist Assist for lower body dressing: Supervision/Verbal cueing     Toileting Toileting    Toileting assist Assist for toileting: Supervision/Verbal cueing     Transfers Chair/bed transfer  Transfers assist     Chair/bed transfer assist level: Supervision/Verbal cueing     Locomotion Ambulation   Ambulation assist      Assist level: Contact Guard/Touching assist Assistive device: Walker-rolling Max distance: 40'   Walk 10 feet activity   Assist     Assist level: Contact Guard/Touching assist Assistive device: Walker-rolling   Walk 50 feet activity   Assist Walk 50 feet with 2 turns activity did not occur: Safety/medical concerns  Assist level: Supervision/Verbal cueing Assistive device: Walker-rolling    Walk 150 feet activity   Assist Walk 150 feet activity did not occur: Safety/medical concerns  Assist level: Supervision/Verbal cueing Assistive device: Walker-rolling    Walk 10 feet  on uneven surface  activity   Assist Walk 10 feet on uneven surfaces activity did not occur: Safety/medical concerns   Assist level: Supervision/Verbal cueing Assistive device: PhotographerWalker-rolling   Wheelchair     Assist Will patient use wheelchair at  discharge?: Yes Type of Wheelchair: Manual    Wheelchair assist level: Supervision/Verbal cueing Max wheelchair distance: 150 ft     Wheelchair 50 feet with 2 turns activity    Assist        Assist Level: Supervision/Verbal cueing   Wheelchair 150 feet activity     Assist     Assist Level: Supervision/Verbal cueing     Medical Problem List and Plan: 1.Decreased functional mobilitysecondary to Lumbar and thoracic spine spinal stenosis with radiculopathy status post L5-S1 anterior longitudinal interbody fusion as well as L1-L5 X LI F1 09/27/2018, T8 through ilium decompression and posterior spinal fusion 01/19/2018 --Continue CIR therapies including PT, OT -tentative discharge 01/31/2018 2. DVT Prophylaxis/Anticoagulation: SCDs. Check vascular study 3. Pain Management:using oxycodone 5-10mg  q3h prn will change to q 4 hr prn for home, enough pills until next MD OP visitin ~2wksthen plan for q6h dosing-now off dilaudid -continue Robaxin and oxycodone needed. Increase Robaxin to 750 mg every 6 as needed -cont OxyCR 15mg  BID,  Increase Lyrica 50mg  TID for LE  Radicular pain-kpad, ice    4. Mood:Ativan 0.5 milligrams every 4 hours as needed.Provide emotional support 5. Neuropsych: This patientiscapable of making decisions on herown behalf. 6. Skin/Wound Care:Routine skin checks, local care to back 7. Fluids/Electrolytes/Nutrition:encourage PO  -I personally reviewed the patient's labs today.    -potassium 3.1--- continue K. Dur 20meq daily 8. Acute blood loss anemia. hgb 8.1 1/18 (up from 7.8), stable at 8.2 on1/21  -Fe++ supplement  -recheck this wk 9. Constipation. Laxative assistance, Good  BM 1/21 and 1/22 after fleets no result with dulc supp, senna  2 tab BID, as discussed with patient recommend repeat fleets enema on 01/29/2018  LOS: 7 days A FACE TO FACE EVALUATION WAS PERFORMED  Erick Colacendrew E  Kirsteins 01/30/2018, 9:03 AM

## 2018-01-30 NOTE — Progress Notes (Signed)
Occupational Therapy Discharge Summary  Patient Details  Name: Jade Price MRN: [id number] Date of Birth: 1950/11/13  Patient has met 7 of 7 long term goals due to improved activity tolerance, improved balance, ability to compensate for deficits and improved awareness.  Patient to discharge at overall Supervision level.  Patient's care partner is independent to provide the necessary intermittent assistance at discharge.  Patient's husband has been present intermittently and is able to verbalize understanding of recommendations and pt is able to recall back precautions and advocate for herself.  Reasons goals not met: N/A  Recommendation:  Patient will benefit from ongoing skilled OT services in home health setting to continue to advance functional skills in the area of BADL and Reduce care partner burden.  Equipment: 3 in1 and tub bench  Reasons for discharge: treatment goals met and discharge from hospital  Patient/family agrees with progress made and goals achieved: Yes  OT Discharge Precautions/Restrictions  Precautions Precautions: Fall;Back Precaution Comments: handout provided previously, pt able to independently recall 3/3 Required Braces or Orthoses: Spinal Brace Spinal Brace: Thoracolumbosacral orthotic General   Vital Signs Therapy Vitals Temp: 98.4 F (36.9 C) Temp Source: Oral Pulse Rate: 85 BP: 125/70 Patient Position (if appropriate): Sitting Oxygen Therapy SpO2: 100 % O2 Device: Room Air Pain Pain Assessment Pain Scale: 0-10 Pain Score: 6  Pain Type: Acute pain;Surgical pain Pain Location: Back Pain Orientation: Mid;Lower Pain Descriptors / Indicators: Aching Pain Onset: On-going Pain Intervention(s): Repositioned;Shower;Other (Comment)(premedicated) 2nd Pain Site Pain Score: 8 Pain Type: Chronic pain Pain Location: Hip Pain Orientation: Right;Left Pain Radiating Towards: upper thigh Pain Descriptors / Indicators: Aching Pain Frequency:  Constant Pain Onset: On-going Pain Intervention(s): Medication (See eMAR) ADL ADL Equipment Provided: Reacher, Sock aid, Long-handled shoe horn, Long-handled sponge Eating: Independent Grooming: Supervision/safety Where Assessed-Grooming: Standing at sink Upper Body Bathing: Supervision/safety Where Assessed-Upper Body Bathing: Shower Lower Body Bathing: Supervision/safety Where Assessed-Lower Body Bathing: Shower Upper Body Dressing: Setup Where Assessed-Upper Body Dressing: Edge of bed Lower Body Dressing: Supervision/safety Where Assessed-Lower Body Dressing: Edge of bed Toileting: Supervision/safety Where Assessed-Toileting: Glass blower/designer: Distant supervision Armed forces technical officer Method: Counselling psychologist: Ambulance person Transfer: Distant supervision Tub/Shower Transfer Method: Optometrist: Facilities manager: Distant supervision Social research officer, government Method: Heritage manager: Education officer, environmental, Civil engineer, contracting with back Vision Baseline Vision/History: Wears glasses Wears Glasses: Reading only Patient Visual Report: No change from baseline Vision Assessment?: No apparent visual deficits Perception  Perception: Within Functional Limits Praxis Praxis: Intact Cognition Overall Cognitive Status: Within Functional Limits for tasks assessed Orientation Level: Oriented X4 Memory: Appears intact Safety/Judgment: Appears intact Sensation Sensation Light Touch: Appears Intact Proprioception: Appears Intact Coordination Gross Motor Movements are Fluid and Coordinated: Yes Fine Motor Movements are Fluid and Coordinated: Yes Extremity/Trunk Assessment RUE Assessment RUE Assessment: Within Functional Limits LUE Assessment LUE Assessment: Within Functional Limits   Iwalani Templeton 01/30/2018, 8:30 AM

## 2018-01-30 NOTE — Progress Notes (Signed)
Physical Therapy Session Note  Patient Details  Name: Jade Price MRN: [id number] Date of Birth: 09/02/1950  Today's Date: 01/30/2018 PT Individual Time: 1305-1350 PT Individual Time Calculation (min): 45 min   Short Term Goals: Week 1:  PT Short Term Goal 1 (Week 1): = LTG  Skilled Therapeutic Interventions/Progress Updates:    session focused on finalizing d/c planning, issuing HEP for continued strengthening and balance re-training, and functional transfers with in room. Pt performed functional mobility and transfers in room and short distance gait with supervision overall with RW. End of session assisted pt with clothing change, pt able to perform EOB with set-up assist. Repositioned in supine without assist and cues for precautions.   Issued following HEP: Standing Hip Abduction with Counter Support - 10 reps - 2 sets - 1-2x daily Mini Squats at Table - 10 reps - 2 sets - 1-2x daily Standing Heel Raise with Support - 10 reps - 2 sets - 1-2x daily Standing Toe Raises at Chair - 10 reps - 2 sets - 1-2x daily Standing Marching - 10 reps - 2 sets - 1-2x daily Tandem Stance with Support - 10 reps - 2 sets - 1-2x daily    Therapy Documentation Precautions:  Precautions Precautions: Fall, Back Precaution Booklet Issued: Yes (comment) Precaution Comments: handout provided previously, pt able to independently recall 3/3 Required Braces or Orthoses: Spinal Brace Spinal Brace: Thoracolumbosacral orthotic, Applied in sitting position Restrictions Weight Bearing Restrictions: No   Pain: Pain Assessment Pain Scale: 0-10 Pain Score: 7  Pain Type: Acute pain;Chronic pain Pain Location: Back Pain Orientation: Mid;Lower Pain Descriptors / Indicators: Aching Pain Frequency: Constant Pain Onset: On-going Pain Intervention(s): Medication (See eMAR)     Therapy/Group: Individual Therapy  Karolee Stamps Darrol Poke, PT, DPT, CBIS  01/30/2018, 2:24 PM

## 2018-01-30 NOTE — Progress Notes (Signed)
Physical Therapy Session Note  Patient Details  Name: Jade Price MRN: [id number] Date of Birth: [birth year]  Today's Date: 01/30/2018 PT Individual Time: 1025-1140 PT Individual Time Calculation (min): 75 min   Short Term Goals: Week 1:  PT Short Term Goal 1 (Week 1): = LTG  Skilled Therapeutic Interventions/Progress Updates:   Pt received sitting EOB with nurse present and agreeable to tx. Pt sits<>stands and ambulates to bathroom 2 times during session with RW and distant supervision. Pt voids bladder both times and performs self-hygiene independently. Pt reports 7/10 back pain and nurse provided pt with Tylenol. Pt ambulates around unit with RW and supervision with increased time. Pt performs car transfer in van-simulated vehicle with supervision and verbal cuing for management of getting legs into vehicle while adhering to back precautions. Pt walks on uneven surface with RW and supervision. Pt performs bed mobility in apartment with leg lifter and close supervision with verbal reminder for back precautions. Pt ascends/descends 12 steps with supervision and verbal cuing for correct sequencing of legs on steps. Pt steps on/off platform step 2 times with RW as per home setup with supervision. Pt utilizes Nustep for 10 min on level 2 with BLE use only for LE strengthening. Pt and husband were educated on continued TLSO brace use at home and home safety when pt discharges. Therapist also educated pt on [facility] PT follow-up. Pt left sitting up in w/c with chair alarm donned and husband present to supervise with all needs in reach.   Therapy Documentation Precautions:  Precautions Precautions: Fall, Back Precaution Booklet Issued: Yes (comment) Precaution Comments: handout provided previously, pt able to independently recall 3/3 Required Braces or Orthoses: Spinal Brace Spinal Brace: Thoracolumbosacral orthotic, Applied in sitting position Restrictions Weight Bearing Restrictions: No Pain: Pain  Assessment Pain Scale: 0-10 Pain Score: 7  Pain Type: Acute pain Pain Location: Back Pain Orientation: Lower;Right;Left Pain Descriptors / Indicators: Aching Pain Frequency: Constant Pain Onset: On-going Pain Intervention(s): Medication (See eMAR)   Therapy/Group: Individual Therapy  Dow Adolph 01/30/2018, 4:33 PM

## 2018-01-30 NOTE — Progress Notes (Signed)
Occupational Therapy Session Note  Patient Details  Name: Kiriaki Gresham MRN: [id number] Date of Birth: Nov 25, 1950  Today's Date: 01/30/2018 OT Individual Time: 0800-0900 OT Individual Time Calculation (min): 60 min    Short Term Goals: Week 1:  OT Short Term Goal 1 (Week 1): n/a d/t ELOS  Skilled Therapeutic Interventions/Progress Updates:    Completed ADL retraining at overall supervision level.  Pt demonstrating increased safety with mobility, ability to don TLSO with setup, and standing tolerance during self-care tasks.  Pt completed bathing at shower level with use of AE to ensure adherence to back precautions.  Pt able to utilize AE to complete LB dressing at supervision/setup level.  Pt completed grooming in standing.  Completed toilet and tub/shower transfers in ADL apt with appropriate DME (that pt will have at home).  Returned to room and completed transfer back to bed with supervision.  Pt left semi-reclined in bed with all needs in reach.  Therapy Documentation Precautions:  Precautions Precautions: Fall, Back Precaution Booklet Issued: Yes (comment) Precaution Comments: handout provided previously, pt able to independently recall 3/3 Required Braces or Orthoses: Spinal Brace Spinal Brace: Thoracolumbosacral orthotic Restrictions Weight Bearing Restrictions: No General:   Vital Signs: Therapy Vitals Temp: 98.4 F (36.9 C) Temp Source: Oral Pulse Rate: 85 BP: 125/70 Patient Position (if appropriate): Sitting Oxygen Therapy SpO2: 100 % O2 Device: Room Air Pain: Pain Assessment Pain Scale: 0-10 Pain Score: 6  Pain Type: Acute pain;Surgical pain Pain Location: Back Pain Orientation: Mid;Lower Pain Descriptors / Indicators: Aching Pain Onset: On-going Pain Intervention(s): Repositioned;Shower;Other (Comment)(premedicated) 2nd Pain Site Pain Score: 8 Pain Type: Chronic pain Pain Location: Hip Pain Orientation: Right;Left Pain Radiating Towards: upper thigh Pain  Descriptors / Indicators: Aching Pain Frequency: Constant Pain Onset: On-going Pain Intervention(s): Medication (See eMAR) ADL: ADL Equipment Provided: Reacher, Sock aid, Long-handled shoe horn, Long-handled sponge Eating: Independent Grooming: Supervision/safety Where Assessed-Grooming: Standing at sink Upper Body Bathing: Supervision/safety Where Assessed-Upper Body Bathing: Shower Lower Body Bathing: Supervision/safety Where Assessed-Lower Body Bathing: Shower Upper Body Dressing: Setup Where Assessed-Upper Body Dressing: Edge of bed Lower Body Dressing: Supervision/safety Where Assessed-Lower Body Dressing: Edge of bed Toileting: Supervision/safety Where Assessed-Toileting: Teacher, adult education: Distant supervision Statistician Method: Proofreader: Chiropractor Transfer: Distant supervision Web designer Method: Ship broker: Biochemist, clinical Transfer: Distant supervision Film/video editor Method: Designer, industrial/product: Grab bars, Shower seat with back   Therapy/Group: Individual Therapy  [name] 01/30/2018, 8:29 AM

## 2018-01-30 NOTE — Progress Notes (Signed)
Discharged home with family ( spouse and son) via w/c and staff member, no acute distress, VSS

## 2018-01-30 NOTE — Discharge Summary (Signed)
Discharge summary job [id number]

## 2018-02-02 NOTE — Discharge Summary (Signed)
Jade Price, Jade MEDICAL RECORD [id number] ACCOUNT [id number] DATE OF BIRTH:June 18, 1950 FACILITY: [facility] LOCATION: [facility] PHYSICIAN:ANDREW Wynn BankerKIRSTEINS, MD  DISCHARGE SUMMARY  DATE OF DISCHARGE:  01/30/2018  DISCHARGE DIAGNOSES: 1.  Lumbar and thoracic spinal stenosis with radiculopathy, status post L5-S1 anterior longitudinal interbody fusion as well as L1-L5 XLIF, T8 through ileum decompression and posterior fusion 01/19/2018. 2.  Sequential compression devices for deep venous thrombosis prophylaxis. 3.  Pain management. 4.  Mood. 5.  Acute blood loss anemia. 6.  Constipation.  HISTORY OF PRESENT ILLNESS:  A 68 year old right-handed female with history of chronic back pain.  She lives with her spouse, independent prior to admission.  Presented 01/15/2018 low back pain radiating to lower extremities.  X-rays and imaging revealed spinal stenosis and scoliosis with foraminal stenosis of the lateral listhesis, L3-L4 stenosis, radiculopathy.  Plans were arranged for 2 part procedure and underwent an L5-S1 anterior lumbar interbody fusion, L1-2, 2-3 3-4,  4-5 anterior lateral lumbar interbody fusion 01/15/2018 per Dr. Venetia MaxonStern as well as second part fusion 01/19/2018.  HOSPITAL COURSE:  Pain management.  Acute blood loss anemia 5.5.  She was transfused 2 units of packed red blood cells.  Hemoglobin improved to 7.8.  TLSO Back brace when out of bed.  Therapy evaluations completed and the patient was admitted for  comprehensive rehabilitation program.  PAST MEDICAL HISTORY:  See discharge diagnoses.  SOCIAL HISTORY:  Lives with spouse.  FUNCTIONAL STATUS:  Upon admission to rehab services, minimal assist 100 feet rolling walker, minimal assist with stand pivot transfers, mod/max assist with ADLs.  PHYSICAL EXAMINATION: VITAL SIGNS:  Blood pressure 107/66, pulse 109, temperature 99, respirations 18. GENERAL:  Alert female.  She was a bit anxious, follows commands. HEENT:  EOMs intact. NECK:   Supple, nontender, no JVD. CARDIOVASCULAR:  Rate controlled. ABDOMEN:  Soft, nontender, good bowel sounds. LUNGS:  Clear to auscultation without wheeze back incision clean and dry.  REHABILITATION HOSPITAL COURSE:  The patient was admitted to inpatient rehabilitation services.  Therapies initiated on a 3-hour daily basis, consisting of physical therapy, occupational therapy and rehabilitation nursing.  The following issues were  addressed:    Pertaining to the patient's 2-part procedure, lumbar, thoracic spine stenosis, radiculopathy, she continued with back brace as advised.  She would follow up with neurosurgery.  Neurovascular sensation intact.  SCDs for DVT prophylaxis.  Vascular studies  negative.    Pain management with the use of OxyContin 15 mg every 12 hours, Lyrica 50 mg 3 times daily, Robaxin as needed for spasms, oxycodone immediate release 10 mg every 3 hours as needed for pain.    Mood stabilization with Ativan only as needed.    Acute blood loss anemia, stable at 8.2.  She remained on iron supplement.  Bouts of constipation, resolved with laxative assistance.    The patient received weekly collaborative interdisciplinary team conferences to discuss estimated length of stay, family teaching, any barriers to discharge.  She could put on her brace with supervision.  Sit to stand, rolling walker, minimal guard to  ambulate to the bathroom minimal guard.  Toileting with supervision.  Able +2 sit to stand in wheelchair and wash her face, hands, dress upper body and lower body.  The patient transferred to, minimal guard.  Ambulates 40 feet.  Working with bed  mobility.  She did make excellent overall progress.  Family teaching completed and planned discharge to home.  DISCHARGE MEDICATIONS:  Included ferrous sulfate 325 mg p.o. b.i.d., OxyContin sustained release 15 mg p.o.  q.12h.  Protonix 40 mg p.o. daily, Lyrica 50 mg p.o. t.i.d., Requip 4 mg p.o. at bedtime, Senokot-S 2 tablets p.o.  b.i.d., Robaxin 750 mg p.o.  every 6 hours as needed for muscle spasms, oxycodone immediate release 10 mg every 4 hours as needed for pain.  DIET:  Regular.  FOLLOWUP:  She would follow up with Dr. Claudette LawsAndrew Kirsteins at the outpatient rehab center as advised; Dr. Maeola HarmanJoseph Stern, call for appointment; Dr. Jonathon Bellowsachel McGee, medical management.  SPECIAL INSTRUCTIONS:  Back brace when out of bed.  No driving.  AN/NUANCE Z:[id number]:01/30/2018 T:01/30/2018 JOB:[id number]

## 2018-02-03 ENCOUNTER — Telehealth: Payer: Self-pay | Admitting: Registered Nurse

## 2018-02-03 NOTE — Telephone Encounter (Signed)
Transitional Care call Transitional Care Call Completed   Patient name: Jade Price DOB: [birth year] 1. Are you/is patient experiencing any problems since coming home? Yes, increase neuropathic pain in her lower extremities she reports.  a. Are there any questions regarding any aspect of care? No 2. Are there any questions regarding medications administration/dosing? Yes, She reports [facility] wouldn't dispense the methocarbamol  Due to black box warning. This provider placed a call to [facility] and spoke with pharmacist. Jesusita Okaan PA had already asked for them to fill the Robaxin. Placed a call to Jade Price regarding the above, she verbalizes understanding.  a. Are meds being taken as prescribed? Yes, except Robaxin due to the above.  b. "Patient should review meds with caller to confirm" Medication List Reviewed.  3. Have there been any falls? No 4. Has Home Health been to the house and/or have they contacted you? Yes, [facility] a. If not, have you tried to contact them? NA b. Can we help you contact them? No 5. Are bowels and bladder emptying properly? Yes a. Are there any unexpected incontinence issues? No b. If applicable, is patient following bowel/bladder programs? NA 6. Any fevers, problems with breathing, unexpected pain? No to fevers or breathing problems, she reports increase neuropathic pain at HS. She is compliant with Lyrica she states. Hospital medication list being reviewed. 7. Are there any skin problems or new areas of breakdown? No 8. Has the patient/family member arranged specialty MD follow up (ie cardiology/neurology/renal/surgical/etc.)?  She has a F/U appointment with Dr. Venetia MaxonStern, She will be calling her PCP today to schedule HFU she states.  a. Can we help arrange? NA 9. Does the patient need any other services or support that we can help arrange? No 10. Are caregivers following through as expected in assisting the patient? Yes 11. Has the patient quit smoking, drinking alcohol, or  using drugs as recommended? Ms. Johna Sheriffrent denies smoking, drinking alcohol or using illicit drugs.   Appointment date/time 02/11/2018  arrival time 2:00 for 2:20 appointment and who it is with here Jacalyn LefevreEunice Thomas ANMP-C. At [address]

## 2018-02-05 ENCOUNTER — Telehealth: Payer: Self-pay

## 2018-02-05 MED ORDER — OXYCODONE HCL 5 MG PO TABS: 5.0000 mg | ORAL_TABLET | Freq: Four times a day (QID) | ORAL | 0 refills | Status: DC | PRN

## 2018-02-05 NOTE — Telephone Encounter (Signed)
Pt called stating she is having trouble sleeping and wanted to know if there was something over the counter she could take.

## 2018-02-05 NOTE — Telephone Encounter (Signed)
Return Ms. [place] call, spoke with Mr. Johna Price he reports Jade Price is not sleeping and experiencing increase intensity of lower back pain especially at HS.She is currently taking her Oxycodone IR  ( 5- 10 mg) Q 8 hours with no relief noted. We will increase her frequency to every 6 hours as needed for pain. She can take her Lorazepam at HS ( increase to 1 mg, at HS only), also educated on the black box warning and instructed to take medications 2 hours apart, he verbalizes understanding. Instructed to call office on Monday for medication evaluation, he verbalizes understanding.

## 2018-02-11 ENCOUNTER — Encounter: Payer: Medicare Other | Attending: Registered Nurse | Admitting: Registered Nurse

## 2018-02-11 ENCOUNTER — Encounter: Payer: Self-pay | Admitting: Registered Nurse

## 2018-02-11 VITALS — BP 105/67 | HR 95 | Ht 65.0 in | Wt 147.0 lb

## 2018-02-11 DIAGNOSIS — M8580 Other specified disorders of bone density and structure, unspecified site: Secondary | ICD-10-CM | POA: Diagnosis not present

## 2018-02-11 DIAGNOSIS — M5415 Radiculopathy, thoracolumbar region: Secondary | ICD-10-CM | POA: Diagnosis present

## 2018-02-11 DIAGNOSIS — Z981 Arthrodesis status: Secondary | ICD-10-CM | POA: Diagnosis not present

## 2018-02-11 DIAGNOSIS — R262 Difficulty in walking, not elsewhere classified: Secondary | ICD-10-CM | POA: Diagnosis not present

## 2018-02-11 DIAGNOSIS — M62838 Other muscle spasm: Secondary | ICD-10-CM | POA: Insufficient documentation

## 2018-02-11 DIAGNOSIS — M419 Scoliosis, unspecified: Secondary | ICD-10-CM | POA: Diagnosis not present

## 2018-02-11 DIAGNOSIS — M1990 Unspecified osteoarthritis, unspecified site: Secondary | ICD-10-CM | POA: Insufficient documentation

## 2018-02-11 DIAGNOSIS — G2581 Restless legs syndrome: Secondary | ICD-10-CM | POA: Insufficient documentation

## 2018-02-11 DIAGNOSIS — M48061 Spinal stenosis, lumbar region without neurogenic claudication: Secondary | ICD-10-CM | POA: Insufficient documentation

## 2018-02-11 DIAGNOSIS — M5416 Radiculopathy, lumbar region: Secondary | ICD-10-CM | POA: Diagnosis not present

## 2018-02-11 DIAGNOSIS — G8918 Other acute postprocedural pain: Secondary | ICD-10-CM

## 2018-02-11 MED ORDER — OXYCODONE HCL 10 MG PO TABS: 10.0000 mg | ORAL_TABLET | Freq: Four times a day (QID) | ORAL | 0 refills | Status: DC | PRN

## 2018-02-11 MED ORDER — ROPINIROLE HCL 4 MG PO TABS: 4.0000 mg | ORAL_TABLET | Freq: Every day | ORAL | 1 refills | Status: DC

## 2018-02-11 MED ORDER — OXYCODONE HCL ER 15 MG PO T12A: 15.0000 mg | EXTENDED_RELEASE_TABLET | Freq: Two times a day (BID) | ORAL | 0 refills | Status: DC

## 2018-02-11 MED ORDER — PREGABALIN 50 MG PO CAPS: 50.0000 mg | ORAL_CAPSULE | Freq: Three times a day (TID) | ORAL | 2 refills | Status: AC

## 2018-02-11 MED ORDER — METHOCARBAMOL 750 MG PO TABS: 750.0000 mg | ORAL_TABLET | Freq: Four times a day (QID) | ORAL | 3 refills | Status: DC | PRN

## 2018-02-11 NOTE — Progress Notes (Signed)
Subjective:    Patient ID: Jade Price, female    DOB: 06/10/1950, 68 y.o.   MRN: [id number]  HPI: Jade Price is a 68 y.o. female who is here for Transitional care visit. She [facility], she underwent L5-S1 Anterior Lumbar Interbody Fusion by Dr. Venetia MaxonStern. Also had Thoracic 8 to Pelvis Fixation with Airo on 01/19/2018 by Dr. Venetia MaxonStern.  She was admitted to [facility] on 01/23/2018 and discharged on 01/30/2018. She was discharged home receiving outpatient therapy with [facility]. She's wearing her back brace and reports her back brace is rubbing on her upper and lower back, redness noted with no drainage. Area covered with dressing, she verbalizes understanding.   Husband in room all questions answered.    Pain Inventory Average Pain 6 Pain Right Now 6 My pain is sharp, burning and aching  In the last 24 hours, has pain interfered with the following? General activity 0 Relation with others 0 Enjoyment of life 0 What TIME of day is your pain at its worst? night Sleep (in general) Fair  Pain is worse with: walking and standing Pain improves with: heat/ice and medication Relief from Meds: 9  Mobility use a walker ability to climb steps?  no do you drive?  no  Function retired  Neuro/Psych trouble walking  Prior Studies Any changes since last visit?  no  Physicians involved in your care Any changes since last visit?  no   Family History  Problem Relation Age of Onset  . Cancer Mother   . Alzheimer's disease Mother    Social History   Socioeconomic History  . Marital status: Married    Spouse name: Not on file  . Number of children: Not on file  . Years of education: Not on file  . Highest education level: Not on file  Occupational History  . Not on file  Social Needs  . Financial resource strain: Not on file  . Food insecurity:    Worry: Not on file    Inability: Not on file  . Transportation needs:   Medical: Not on file    Non-medical: Not on file  Tobacco Use  . Smoking status: Never Smoker  . Smokeless tobacco: Never Used  Substance and Sexual Activity  . Alcohol use: Not Currently    Comment: occasion  . Drug use: Never  . Sexual activity: Not on file  Lifestyle  . Physical activity:    Days per week: Not on file    Minutes per session: Not on file  . Stress: Not on file  Relationships  . Social connections:    Talks on phone: Not on file    Gets together: Not on file    Attends religious service: Not on file    Active member of club or organization: Not on file    Attends meetings of clubs or organizations: Not on file    Relationship status: Not on file  Other Topics Concern  . Not on file  Social History Narrative  . Not on file   Past Surgical History:  Procedure Laterality Date  . ABDOMINAL EXPOSURE N/A 01/15/2018   Procedure: ABDOMINAL EXPOSURE;  Surgeon: Nada LibmanBrabham, Vance W, MD;  [facility];  Service: Vascular;  Laterality: N/A;  . ANTERIOR LATERAL LUMBAR FUSION 4 LEVELS Right 01/15/2018   Procedure: Right Anterior Lateral Lumbar Interbody Fusion Lumbar One-Two, Two-Three, Three-Four, and Four-Five;  Surgeon: Maeola HarmanStern, Joseph, MD;  [facility];  Service: Neurosurgery;  Laterality: Right;  Right Anterior Lateral Lumbar Interbody Fusion Lumbar One-Two, Two-Three, Three-Four, and Four-Five   . ANTERIOR LUMBAR FUSION N/A 01/15/2018   Procedure: Lumbar Five to Sacral One  Anterior lumbar interbody fusion;  Surgeon: Maeola Harman, MD;  Location: [facility] OR;  Service: Neurosurgery;  Laterality: N/A;  Lumbar Five to Sacral One  Anterior lumbar interbody fusion  . APPLICATION OF INTRAOPERATIVE CT SCAN N/A 01/19/2018   Procedure: APPLICATION OF INTRAOPERATIVE CT SCAN;  Surgeon: Maeola Harman, MD;  Location: [facility] OR;  Service: Neurosurgery;  Laterality: N/A;  . COLONOSCOPY    . FINGER SURGERY Right    infection  . POSTERIOR LUMBAR FUSION 4 LEVEL N/A 01/19/2018   Procedure: Thoracic  Eight to pelvis fixation with Airo;  Surgeon: Maeola Harman, MD;  Location: [facility] OR;  Service: Neurosurgery;  Laterality: N/A;   Past Medical History:  Diagnosis Date  . Anxiety    situitional  . Arthritis   . Back pain 02/2017  . Complication of anesthesia   . Degenerative lumbar spinal stenosis   . Disc displacement, lumbar   . GERD (gastroesophageal reflux disease)   . High cholesterol   . Hip pain 02/2017  . Leg pain    While walking  . Lumbar radiculopathy   . Osteopenia 10/01/2017  . PONV (postoperative nausea and vomiting)   . RLS (restless legs syndrome)   . Sciatic notch pain, right   . Scoliosis   . Spondylolisthesis    Lumber region  . Staph infection 2012   Finger   BP 105/67   Pulse 95   Ht 5\' 5"  (1.651 m)   Wt 147 lb (66.7 kg)   SpO2 95%   BMI 24.46 kg/m   Opioid Risk Score:   Fall Risk Score:  `1  Depression screen PHQ 2/9  No flowsheet data found.   Review of Systems  Constitutional: Negative.   HENT: Negative.   Eyes: Negative.   Respiratory: Negative.   Cardiovascular: Negative.   Gastrointestinal: Positive for constipation.  Endocrine: Negative.   Genitourinary: Negative.   Musculoskeletal: Positive for arthralgias, back pain, gait problem and myalgias.  Skin: Negative.   Allergic/Immunologic: Negative.   Hematological: Negative.   Psychiatric/Behavioral: Negative.   All other systems reviewed and are negative.      Objective:   Physical Exam Vitals signs and nursing note reviewed.  Constitutional:      Appearance: Normal appearance.  Neck:     Musculoskeletal: Normal range of motion and neck supple.  Cardiovascular:     Rate and Rhythm: Normal rate and regular rhythm.     Pulses: Normal pulses.     Heart sounds: Normal heart sounds.  Pulmonary:     Effort: Pulmonary effort is normal.     Breath sounds: Normal breath sounds.  Musculoskeletal:     Comments: Normal Muscle Bulk and Muscle Testing Reveals:  Upper Extremities:  Full ROM and Muscle Strength 5/5  Wearing Back Brace  Lower Extremities: Full ROM and Muscle Strength 5/5 Arises from Table slowly using walker for support Narrow Based Gait   Skin:    General: Skin is warm and dry.  Neurological:     Mental Status: She is alert and oriented to person, place, and time.  Psychiatric:        Mood and Affect: Mood normal.        Behavior: Behavior normal.           Assessment & Plan:  1. S/P Fusion: Continue Out  patient Therapy. Has a F/U with Dr. Venetia Maxon.  2. Post- Operative Pain: Refilled: Oxycontin 15 mg one tablet every 12 hours #60 and Oxycodone 10 mg one tablet every 6 hours #120.  We will continue the opioid monitoring program, this consists of regular clinic visits, examinations, urine drug screen, pill counts as well as use of [place] Controlled Substance Reporting system. She is also prescribed Lorazepam, last dose was a week ago,We have discussed the black box warning of using opioids and benzodiazepines. I highlighted the dangers of using these drugs together and discussed the adverse events including respiratory suppression, overdose, cognitive impairment and importance of compliance with current regimen. We will continue to monitor and adjust as indicated.  3. Lumbar Radiculitis: Continue Lyrica. Continue to Monitor.  4. Muscle Spasms: Contin Methocarbamol. Continue to Monitor.  5. Restless Leg Syndrome: Continue Requip. Continue to monitor.   30 minutes of face to face patient care time was spent during this visit. All questions were encouraged and answered.  F/U with Dr. Wynn Banker in 4- 6 weeks

## 2018-02-13 ENCOUNTER — Telehealth: Payer: Self-pay | Admitting: *Deleted

## 2018-02-13 MED ORDER — OXYCODONE HCL ER 15 MG PO T12A: 15.0000 mg | EXTENDED_RELEASE_TABLET | Freq: Two times a day (BID) | ORAL | 0 refills | Status: DC

## 2018-02-13 NOTE — Telephone Encounter (Signed)
Patients husband left a message stating that they are unable to pick up the oxycodone 15mg  ER at the [facility].  The [facility] [address] [facility].  [facility] have [facility] to [facility] and have added the other [facility] to her list of pharmacies

## 2018-02-13 NOTE — Telephone Encounter (Signed)
Pharmacist from [facility] in [place] called to say that they are not able to get the Oxycontin in for Jade Price. They are asking that you send it to the other [facility] in [place] (ph [phone]).  She would like a call back to confirm. Her number is [phone]

## 2018-02-13 NOTE — Telephone Encounter (Signed)
Oxycontin e-scribed , Jade Price is aware of the above.

## 2018-02-23 ENCOUNTER — Telehealth: Payer: Self-pay | Admitting: *Deleted

## 2018-02-23 NOTE — Telephone Encounter (Signed)
Kathlene November PT called to request 3 additional visits. Approval given.

## 2018-03-20 ENCOUNTER — Ambulatory Visit: Payer: Medicare Other | Admitting: Physical Medicine & Rehabilitation

## 2018-04-27 ENCOUNTER — Other Ambulatory Visit: Payer: Self-pay | Admitting: Registered Nurse

## 2019-05-31 IMAGING — DX DG CHEST 1V PORT
1 series · 1 of 1 positions shown · non-contrast
Comparison: Limited correlation made with scoliosis radiographs
01/15/2018.

CLINICAL DATA: Fever today.

EXAM:
PORTABLE CHEST 1 VIEW

[chest ap]
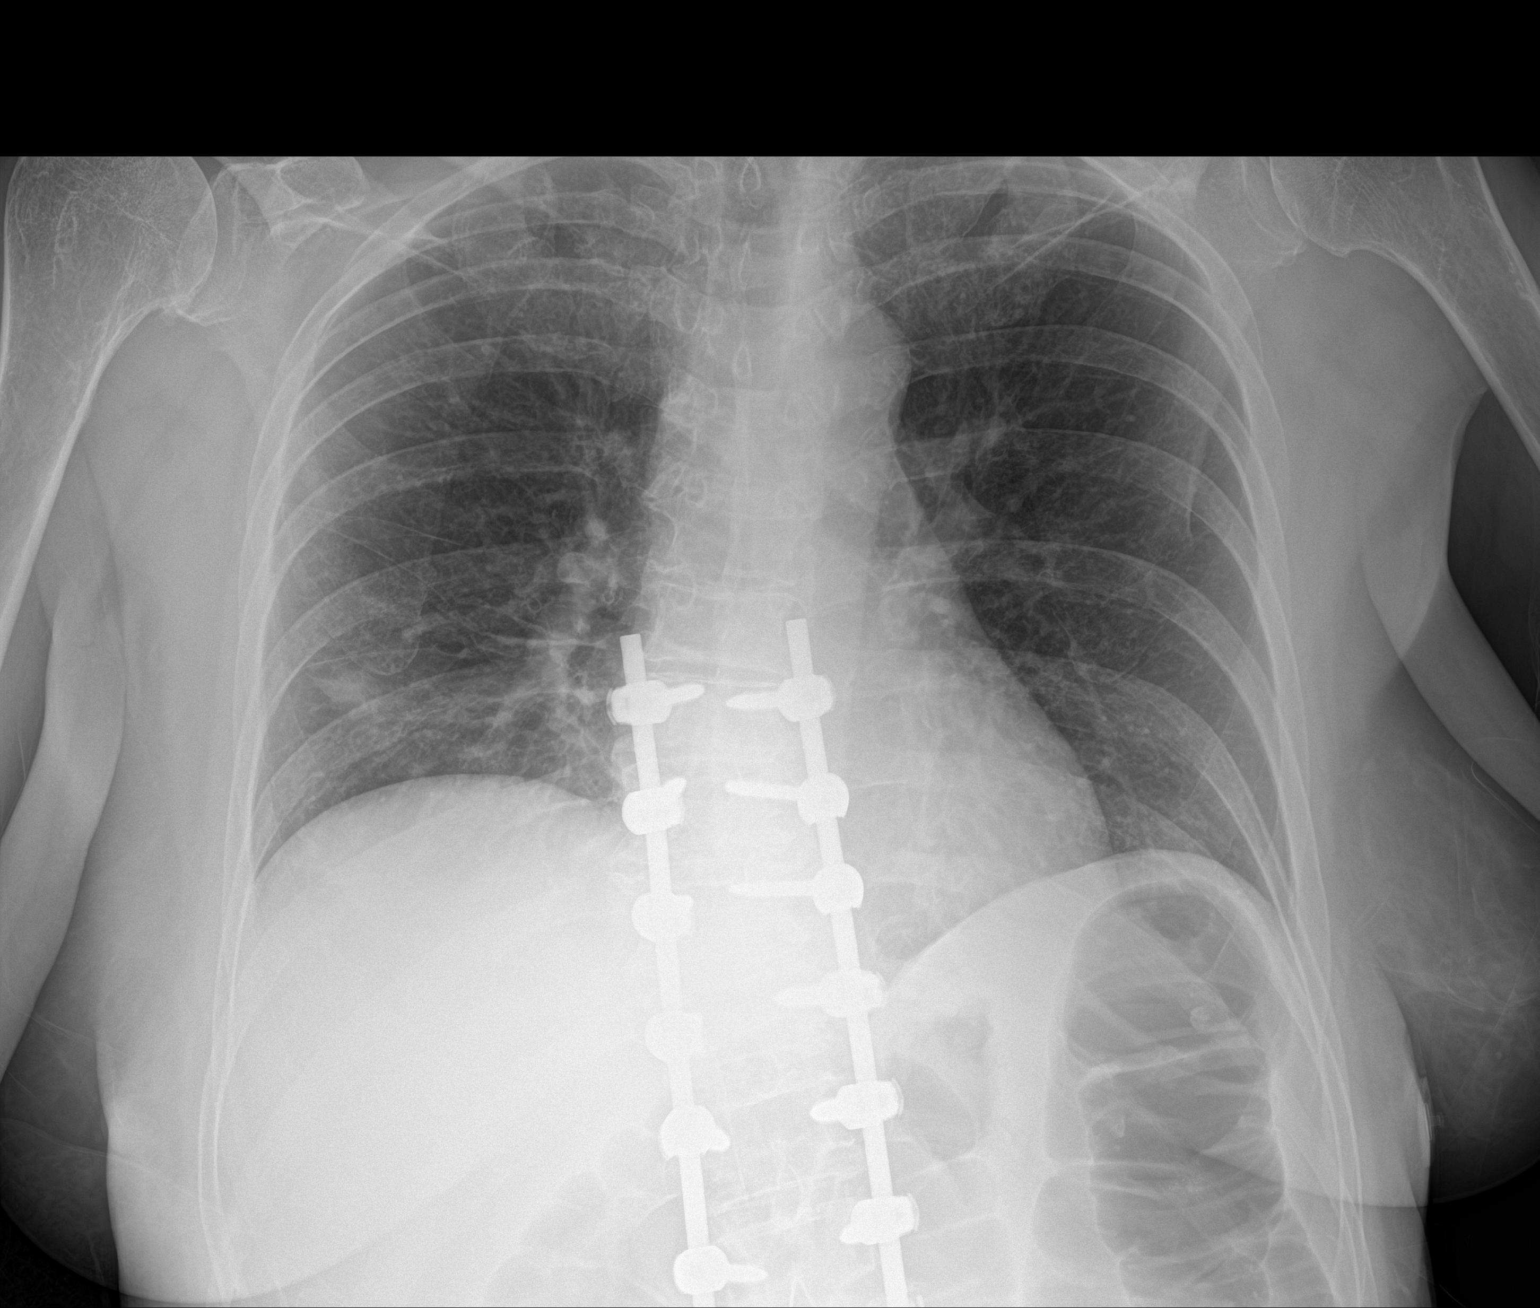

[1 of 1 positions shown; findings below may reference images not displayed]

FINDINGS: 2949 hours. The heart size and mediastinal contours are stable.
There is stable asymmetric elevation of the right hemidiaphragm.
There is a new ill defined nodular density at the right lung base,
not seen on recent spine radiographs and therefore favored to
reflect subsegmental atelectasis. A small nodule is difficult to
exclude. There is no confluent airspace opacity, pleural effusion or
pneumothorax. Interval thoracolumbar fusion noted, incompletely
visualized.
IMPRESSION: Probable focal atelectasis at the right lung base in this recently
postoperative patient. No confluent airspace opacity to suggest
pneumonia. Two-view chest radiographic follow-up recommended when
the patient is able.

## 2019-08-04 ENCOUNTER — Other Ambulatory Visit: Payer: Self-pay | Admitting: Neurosurgery

## 2019-08-04 DIAGNOSIS — M8008XA Age-related osteoporosis with current pathological fracture, vertebra(e), initial encounter for fracture: Secondary | ICD-10-CM

## 2019-08-05 ENCOUNTER — Other Ambulatory Visit: Payer: Self-pay | Admitting: Neurosurgery

## 2019-08-05 DIAGNOSIS — M8008XA Age-related osteoporosis with current pathological fracture, vertebra(e), initial encounter for fracture: Secondary | ICD-10-CM

## 2019-08-13 ENCOUNTER — Other Ambulatory Visit: Payer: Self-pay

## 2019-08-13 ENCOUNTER — Ambulatory Visit
Admission: RE | Admit: 2019-08-13 | Discharge: 2019-08-13 | Disposition: A | Discharge status: Home / Self Care | Payer: Medicare Other | Source: Ambulatory Visit | Attending: Neurosurgery | Admitting: Neurosurgery

## 2019-08-13 DIAGNOSIS — M8008XA Age-related osteoporosis with current pathological fracture, vertebra(e), initial encounter for fracture: Secondary | ICD-10-CM

## 2019-08-13 NOTE — Consult Note (Signed)
Chief Complaint: Patient was seen in consultation today for bilateral sacral insufficiency fractures. Referring Physician(s): Cabbell,Kyle    History of Present Illness: Jade Price is a 69 y.o. female who is status post thoracolumbar fusion for scoliosis who developed new onset posterior pelvic pain approximately 2 months ago.  She has no known injury.  CT 7/23 revealed bilateral sacral insufficiency fractures.  The pain is worth with movement and walking.  She is most comfortable sitting on a dining room chair with pillows.  She complains of some pain and tingling in her legs bilaterally.  Her pain is moderately controlled with medications.  She states the pain has not improved over the last 2 months.  She was independent of her walker before the pain started, but is again relying on her walker for mobility.  [place] Disability Questionnaire was administered.  She scored 17/24.  VAS Pain score 7-8/10.    Past Medical History:  Diagnosis Date  . Anxiety    situitional  . Arthritis   . Back pain 02/2017  . Complication of anesthesia   . Degenerative lumbar spinal stenosis   . Disc displacement, lumbar   . GERD (gastroesophageal reflux disease)   . High cholesterol   . Hip pain 02/2017  . Leg pain    While walking  . Lumbar radiculopathy   . Osteopenia 10/01/2017  . PONV (postoperative nausea and vomiting)   . RLS (restless legs syndrome)   . Sciatic notch pain, right   . Scoliosis   . Spondylolisthesis    Lumber region  . Staph infection 2012   Finger    Past Surgical History:  Procedure Laterality Date  . ABDOMINAL EXPOSURE N/A 01/15/2018   Procedure: ABDOMINAL EXPOSURE;  Surgeon: Nada Libman, MD;  Location: [facility] OR;  Service: Vascular;  Laterality: N/A;  . ANTERIOR LATERAL LUMBAR FUSION 4 LEVELS Right 01/15/2018   Procedure: Right Anterior Lateral Lumbar Interbody Fusion Lumbar One-Two, Two-Three, Three-Four, and Four-Five;  Surgeon: Maeola Harman, MD;   Location: [facility] OR;  Service: Neurosurgery;  Laterality: Right;  Right Anterior Lateral Lumbar Interbody Fusion Lumbar One-Two, Two-Three, Three-Four, and Four-Five   . ANTERIOR LUMBAR FUSION N/A 01/15/2018   Procedure: Lumbar Five to Sacral One  Anterior lumbar interbody fusion;  Surgeon: Maeola Harman, MD;  Location: [facility] OR;  Service: Neurosurgery;  Laterality: N/A;  Lumbar Five to Sacral One  Anterior lumbar interbody fusion  . APPLICATION OF INTRAOPERATIVE CT SCAN N/A 01/19/2018   Procedure: APPLICATION OF INTRAOPERATIVE CT SCAN;  Surgeon: Maeola Harman, MD;  Location: [facility] OR;  Service: Neurosurgery;  Laterality: N/A;  . COLONOSCOPY    . FINGER SURGERY Right    infection  . POSTERIOR LUMBAR FUSION 4 LEVEL N/A 01/19/2018   Procedure: Thoracic Eight to pelvis fixation with Airo;  Surgeon: Maeola Harman, MD;  Location: [facility] OR;  Service: Neurosurgery;  Laterality: N/A;    Allergies: Gabapentin  Medications: Prior to Admission medications   Medication Sig Start Date End Date Taking? Authorizing Provider  acetaminophen (TYLENOL) 325 MG tablet Take 2 tablets (650 mg total) by mouth every 4 (four) hours as needed for mild pain ((score 1 to 3) or temp > 100.5). 01/30/18   Angiulli, Mcarthur Rossetti, PA-C  bismuth subsalicylate (PEPTO BISMOL) 262 MG chewable tablet Chew 524 mg by mouth as needed for indigestion or diarrhea or loose stools.    [provider]  ferrous sulfate 325 (65 FE) MG tablet Take 1 tablet (325 mg total) by mouth 2 (two)  times daily with a meal. 01/30/18   Angiulli, Mcarthur Rossetti, PA-C  LORazepam (ATIVAN) 0.5 MG tablet Take 1 tablet (0.5 mg total) by mouth every 4 (four) hours as needed for anxiety. Patient not taking: Reported on 02/11/2018 01/30/18   Angiulli, Mcarthur Rossetti, PA-C  methocarbamol (ROBAXIN) 750 MG tablet Take 1 tablet (750 mg total) by mouth every 6 (six) hours as needed for muscle spasms. 02/11/18   Jones Bales, NP  omeprazole (PRILOSEC OTC) 20 MG tablet Take 40 mg by mouth daily.     [provider]  oxyCODONE (OXY IR/ROXICODONE) 5 MG immediate release tablet Take 5 mg by mouth every 4 (four) hours as needed for severe pain.    [provider]  oxyCODONE (OXYCONTIN) 15 mg 12 hr tablet Take 1 tablet (15 mg total) by mouth every 12 (twelve) hours. 02/13/18   Jones Bales, NP  Oxycodone HCl 10 MG TABS Take 1 tablet (10 mg total) by mouth every 6 (six) hours as needed. 02/11/18   Jones Bales, NP  pregabalin (LYRICA) 50 MG capsule Take 1 capsule (50 mg total) by mouth 3 (three) times daily. 02/11/18   Jones Bales, NP  rOPINIRole (REQUIP) 4 MG tablet TAKE 1 TABLET BY MOUTH ONCE DAILY AT BEDTIME 04/27/18   Kirsteins, Victorino Sparrow, MD     Family History  Problem Relation Age of Onset  . Cancer Mother   . Alzheimer's disease Mother     Social History   Socioeconomic History  . Marital status: Married    Spouse name: Not on file  . Number of children: Not on file  . Years of education: Not on file  . Highest education level: Not on file  Occupational History  . Not on file  Tobacco Use  . Smoking status: Never Smoker  . Smokeless tobacco: Never Used  Vaping Use  . Vaping Use: Never used  Substance and Sexual Activity  . Alcohol use: Not Currently    Comment: occasion  . Drug use: Never  . Sexual activity: Not on file  Other Topics Concern  . Not on file  Social History Narrative  . Not on file   Social Determinants of Health   Financial Resource Strain:   . Difficulty of Paying Living Expenses:   Food Insecurity:   . Worried About Programme researcher, broadcasting/film/video in the Last Year:   . Barista in the Last Year:   Transportation Needs:   . Freight forwarder (Medical):   Marland Kitchen Lack of Transportation (Non-Medical):   Physical Activity:   . Days of Exercise per Week:   . Minutes of Exercise per Session:   Stress:   . Feeling of Stress :   Social Connections:   . Frequency of Communication with Friends and Family:   . Frequency of Social  Gatherings with Friends and Family:   . Attends Religious Services:   . Active Member of Clubs or Organizations:   . Attends Banker Meetings:   Marland Kitchen Marital Status:     ECOG Status: 1 - Symptomatic but completely ambulatory  Review of Systems: A 12 point ROS discussed and pertinent positives are indicated in the HPI above.  All other systems are negative.  Review of Systems  Cardiovascular: Positive for leg swelling.  Gastrointestinal:       Indigestion/reflux    Vital Signs: BP 121/70   Pulse 66   Temp 97.6 F (36.4 C)   Resp 16  Wt 77.1 kg   SpO2 98%   BMI 28.29 kg/m   Physical Exam Constitutional:      General: She is not in acute distress.    Appearance: Normal appearance. She is normal weight.  Musculoskeletal:       Back:     Comments: Tenderness in bilateral posterior pelvis with palpation and posterior lateral extension.  Neurological:     Mental Status: She is alert and oriented to person, place, and time. Mental status is at baseline.     Motor: Motor function is intact.     Comments: Normal Strength bilateral lower extremities.     Imaging: No results found.  Labs:  CBC: No results for input(s): WBC, HGB, HCT, PLT in the last 8760 hours.  COAGS: No results for input(s): INR, APTT in the last 8760 hours.  BMP: No results for input(s): NA, K, CL, CO2, GLUCOSE, BUN, CALCIUM, CREATININE, GFRNONAA, GFRAA in the last 8760 hours.  Invalid input(s): CMP  LIVER FUNCTION TESTS: No results for input(s): BILITOT, AST, ALT, ALKPHOS, PROT, ALBUMIN in the last 8760 hours.  TUMOR MARKERS: No results for input(s): AFPTM, CEA, CA199, CHROMGRNA in the last 8760 hours.  Assessment and Plan:  Symptomatic non healing bilateral sacral insufficiency fractures.    Given the delay healing of these fractures I recommend bilateral sacroplasty.  We discussed the risks and benefits.  I answered all questions.  Jade Price would like to proceed with the  procedure.   Thank you for this interesting consult.  I greatly enjoyed meeting Jade Price and look forward to participating in their care.  A copy of this report was sent to the requesting provider on this date.  Electronically Signed: Chauncey Fischer, MD 08/13/2019, 5:05 PM   I spent a total of 20 Minutes 15 Minutes   in face to face in clinical consultation, greater than 50% of which was counseling/coordinating care for Jade Price

## 2019-08-18 ENCOUNTER — Other Ambulatory Visit: Payer: Medicare Other

## 2019-08-27 ENCOUNTER — Other Ambulatory Visit: Payer: Self-pay | Admitting: Neurosurgery

## 2019-08-27 ENCOUNTER — Ambulatory Visit
Admission: RE | Admit: 2019-08-27 | Discharge: 2019-08-27 | Disposition: A | Discharge status: Home / Self Care | Payer: Medicare Other | Source: Ambulatory Visit | Attending: Neurosurgery | Admitting: Neurosurgery

## 2019-08-27 ENCOUNTER — Other Ambulatory Visit: Payer: Self-pay | Admitting: Radiology

## 2019-08-27 ENCOUNTER — Other Ambulatory Visit: Payer: Medicare Other

## 2019-08-27 ENCOUNTER — Other Ambulatory Visit: Payer: Self-pay

## 2019-08-27 DIAGNOSIS — M8008XA Age-related osteoporosis with current pathological fracture, vertebra(e), initial encounter for fracture: Secondary | ICD-10-CM

## 2019-08-27 DIAGNOSIS — Z09 Encounter for follow-up examination after completed treatment for conditions other than malignant neoplasm: Secondary | ICD-10-CM

## 2019-08-27 MED ORDER — FENTANYL CITRATE (PF) 100 MCG/2ML IJ SOLN
25.0000 ug | INTRAMUSCULAR | Status: DC | PRN
  Administered 2019-08-27 (×2): 50 ug via INTRAVENOUS
  Administered 2019-08-27: 25 ug via INTRAVENOUS

## 2019-08-27 MED ORDER — MIDAZOLAM HCL 2 MG/2ML IJ SOLN
1.0000 mg | INTRAMUSCULAR | Status: DC | PRN
  Administered 2019-08-27 (×2): 1 mg via INTRAVENOUS

## 2019-08-27 MED ORDER — CEFAZOLIN SODIUM-DEXTROSE 2-4 GM/100ML-% IV SOLN
2.0000 g | INTRAVENOUS | Status: AC
  Administered 2019-08-27: 2 g via INTRAVENOUS

## 2019-08-27 MED ORDER — KETOROLAC TROMETHAMINE 30 MG/ML IJ SOLN
30.0000 mg | Freq: Once | INTRAMUSCULAR | Status: AC
  Administered 2019-08-27: 30 mg via INTRAVENOUS

## 2019-08-27 MED ORDER — SODIUM CHLORIDE 0.9 % IV SOLN: Freq: Once | INTRAVENOUS | Status: AC

## 2019-08-27 NOTE — Discharge Instructions (Signed)
Sacroplasty Post Procedure Discharge Instructions ° °1. May resume a regular diet and any medications that you routinely take (including pain medications). °2. No driving day of procedure. °3. Upon discharge go home and rest for at least 4 hours.  May use an ice pack as needed to injection sites on back.  Ice to back 30 minutes on and 30 minutes off, all day. °4. May remove bandaids tomorrow after taking a shower. Replace daily with clean bandaid until healed. °5. Do not lift anything heavier than a milk jug. °6. Follow up with your attending physician in 2 weeks. ° ° ° °Please contact our office at [phone] for the following symptoms: ° °· Fever greater than 100 degrees °· Increased swelling, pain, or redness at injection site. ° ° °Thank you for visiting [facility]. °

## 2019-08-27 NOTE — Progress Notes (Signed)
Pt back in nursing station post procedure. Pt alert and talking in complete sentences. Pt denies pain at this time, no complaints at this time.

## 2019-09-17 ENCOUNTER — Ambulatory Visit
Admission: RE | Admit: 2019-09-17 | Discharge: 2019-09-17 | Disposition: A | Discharge status: Home / Self Care | Payer: Medicare Other | Source: Ambulatory Visit | Attending: Radiology | Admitting: Radiology

## 2019-09-17 ENCOUNTER — Other Ambulatory Visit: Payer: Self-pay

## 2019-09-17 DIAGNOSIS — Z09 Encounter for follow-up examination after completed treatment for conditions other than malignant neoplasm: Secondary | ICD-10-CM

## 2019-09-17 NOTE — Final Progress Note (Signed)
Brief phone call  I spoke with Jade Price today by phone.  She was unable to make her in person appointment due to a current upper respiratory illness.  Relative to her sacrum, she is feeling much better and no longer has any pain related to her low back or posterior pelvis.  She states she is bent forward and is starting to increase mobility with her walker and a multprong cane.  I encouraged her to use those devices as she regains mobility to avoid falling.  No further follow up is needed with me.   She has a scheduled follow up with Dr. Venetia Maxon 9/20.

## 2019-10-20 NOTE — Addendum Note (Signed)
Encounter addended by: Marin Roberts, MD on: 10/20/2019 8:12 AM  Actions taken: Pend clinical note

## 2019-10-20 NOTE — Progress Notes (Signed)
    Chief Complaint: Patient was consulted remotely today (TeleHealth) for follow up of sacroplasty.  This is an addendum to the previous note to document the reasons for the telehealth visit.    Electronically Signed: Nolene Rocks W 10/20/2019, 8:10 AM   I spent a total of    5 Minutes in remote  clinical consultation, greater than 50% of which was counseling/coordinating care for follow up of sacroplasty.    Visit type: Audio only (telephone). Audio (no video) only due to patient requesting phone only..  Alternative for in-person consultation at [facility], [address], [place], [place].  This visit type was conducted due to national recommendations for restrictions regarding the COVID-19 Pandemic (e.g. social distancing).  This format is felt to be most appropriate for this patient at this time.  All issues noted in this document were discussed and addressed.

## 2020-03-25 NOTE — Progress Notes (Signed)
Referring-Joseph Venetia Maxon, MD Reason for referral-dizziness and edema  HPI: 70 year old female for evaluation of dizziness and edema at request of Maeola Harman, MD. Patient states she has had problems with her back and has had previous surgeries.  Over the past several months she has noticed bilateral lower extremity edema left greater than right.  Worsens at night and improves during the day.  Note she is unable to sleep in bed due to back pain.  She denies dyspnea, orthopnea, chest pain, palpitations or syncope.  She does have dizziness that is worse with standing but can happen while she is seated and lying.  There is no syncope.  Her dizziness is continuous when she is standing and worsens when she turns at times.  Cardiology now asked to evaluate.  Current Outpatient Medications  Medication Sig Dispense Refill  . acetaminophen (TYLENOL) 325 MG tablet Take 2 tablets (650 mg total) by mouth every 4 (four) hours as needed for mild pain ((score 1 to 3) or temp > 100.5).    Marland Kitchen bismuth subsalicylate (PEPTO BISMOL) 262 MG chewable tablet Chew 524 mg by mouth as needed for indigestion or diarrhea or loose stools.    . ferrous sulfate 325 (65 FE) MG tablet Take 1 tablet (325 mg total) by mouth 2 (two) times daily with a meal. 60 tablet 3  . LORazepam (ATIVAN) 0.5 MG tablet Take 1 tablet (0.5 mg total) by mouth every 4 (four) hours as needed for anxiety. 30 tablet 0  . methocarbamol (ROBAXIN) 750 MG tablet Take 1 tablet (750 mg total) by mouth every 6 (six) hours as needed for muscle spasms. 120 tablet 3  . omeprazole (PRILOSEC OTC) 20 MG tablet Take 40 mg by mouth daily.    Marland Kitchen oxyCODONE (OXY IR/ROXICODONE) 5 MG immediate release tablet Take 5 mg by mouth every 4 (four) hours as needed for severe pain.    Marland Kitchen oxyCODONE (OXYCONTIN) 15 mg 12 hr tablet Take 1 tablet (15 mg total) by mouth every 12 (twelve) hours. 60 tablet 0  . Oxycodone HCl 10 MG TABS Take 1 tablet (10 mg total) by mouth every 6 (six) hours  as needed. 120 tablet 0  . pregabalin (LYRICA) 50 MG capsule Take 1 capsule (50 mg total) by mouth 3 (three) times daily. 90 capsule 2  . rOPINIRole (REQUIP) 4 MG tablet TAKE 1 TABLET BY MOUTH ONCE DAILY AT BEDTIME 30 tablet 0   No current facility-administered medications for this visit.    Allergies  Allergen Reactions  . Gabapentin Other (See Comments)    Depression within 2 days     Past Medical History:  Diagnosis Date  . Anxiety    situitional  . Arthritis   . Back pain 02/2017  . Complication of anesthesia   . Degenerative lumbar spinal stenosis   . Disc displacement, lumbar   . GERD (gastroesophageal reflux disease)   . High cholesterol   . Hip pain 02/2017  . Leg pain    While walking  . Lumbar radiculopathy   . Osteopenia 10/01/2017  . PONV (postoperative nausea and vomiting)   . RLS (restless legs syndrome)   . Sciatic notch pain, right   . Scoliosis   . Spondylolisthesis    Lumber region  . Staph infection 2012   Finger    Past Surgical History:  Procedure Laterality Date  . ABDOMINAL EXPOSURE N/A 01/15/2018   Procedure: ABDOMINAL EXPOSURE;  Surgeon: Nada Libman, MD;  Location: [facility] OR;  Service:  Vascular;  Laterality: N/A;  . ANTERIOR LATERAL LUMBAR FUSION 4 LEVELS Right 01/15/2018   Procedure: Right Anterior Lateral Lumbar Interbody Fusion Lumbar One-Two, Two-Three, Three-Four, and Four-Five;  Surgeon: Maeola Harman, MD;  Location: [facility] OR;  Service: Neurosurgery;  Laterality: Right;  Right Anterior Lateral Lumbar Interbody Fusion Lumbar One-Two, Two-Three, Three-Four, and Four-Five   . ANTERIOR LUMBAR FUSION N/A 01/15/2018   Procedure: Lumbar Five to Sacral One  Anterior lumbar interbody fusion;  Surgeon: Maeola Harman, MD;  Location: [facility] OR;  Service: Neurosurgery;  Laterality: N/A;  Lumbar Five to Sacral One  Anterior lumbar interbody fusion  . APPLICATION OF INTRAOPERATIVE CT SCAN N/A 01/19/2018   Procedure: APPLICATION OF INTRAOPERATIVE CT SCAN;  Surgeon:  Maeola Harman, MD;  Location: [facility] OR;  Service: Neurosurgery;  Laterality: N/A;  . COLONOSCOPY    . FINGER SURGERY Right    infection  . POSTERIOR LUMBAR FUSION 4 LEVEL N/A 01/19/2018   Procedure: Thoracic Eight to pelvis fixation with Airo;  Surgeon: Maeola Harman, MD;  Location: [facility] OR;  Service: Neurosurgery;  Laterality: N/A;    Social History   Socioeconomic History  . Marital status: Married    Spouse name: Not on file  . Number of children: 4  . Years of education: Not on file  . Highest education level: Not on file  Occupational History  . Not on file  Tobacco Use  . Smoking status: Never Smoker  . Smokeless tobacco: Never Used  Vaping Use  . Vaping Use: Never used  Substance and Sexual Activity  . Alcohol use: Not Currently  . Drug use: Never  . Sexual activity: Not on file  Other Topics Concern  . Not on file  Social History Narrative  . Not on file   Social Determinants of Health   Financial Resource Strain: Not on file  Food Insecurity: Not on file  Transportation Needs: Not on file  Physical Activity: Not on file  Stress: Not on file  Social Connections: Not on file  Intimate Partner Violence: Not on file    Family History  Problem Relation Age of Onset  . Cancer Mother   . Alzheimer's disease Mother     ROS: Back pain but no fevers or chills, productive cough, hemoptysis, dysphasia, odynophagia, melena, hematochezia, dysuria, hematuria, rash, seizure activity, orthopnea, PND, claudication. Remaining systems are negative.  Physical Exam:   Blood pressure (!) 114/58, pulse 70, height 5\' 5"  (1.651 m), weight 179 lb (81.2 kg).  General:  Well developed/well nourished in NAD Skin warm/dry Patient not depressed No peripheral clubbing Back-normal HEENT-normal/normal eyelids Neck supple/normal carotid upstroke bilaterally; no bruits; no JVD; no thyromegaly chest - CTA/ normal expansion CV - RRR/normal S1 and S2; no murmurs, rubs or gallops;  PMI  nondisplaced Abdomen -NT/ND, no HSM, no mass, + bowel sounds, no bruit 2+ femoral pulses, no bruits Ext-1+ edema (L>R), no chords, 2+ DP Neuro-grossly nonfocal  ECG -normal sinus rhythm at a rate of 70, left ventricular hypertrophy.  Personally reviewed  A/P  1 edema-likely lymphedema.  Left greater than right.  We will arrange venous Dopplers to exclude DVT.  I have asked her to keep her feet elevated and also use compression hose.  I have given her prescription for Lasix 20 mg every other day.  Check potassium and renal function in 1 week.  We will also arrange an echocardiogram to assess LV function.  2 dizziness-etiology unclear.  Can occur both with sitting and standing.  It is continuous  with standing and also worsens with turning.  I do not think this is likely cardiac in etiology.  3 hyperlipidemia-follow-up primary care.  Olga Millers, MD

## 2020-03-28 ENCOUNTER — Other Ambulatory Visit: Payer: Self-pay

## 2020-03-28 ENCOUNTER — Ambulatory Visit (INDEPENDENT_AMBULATORY_CARE_PROVIDER_SITE_OTHER): Payer: Medicare Other | Admitting: Cardiology

## 2020-03-28 ENCOUNTER — Ambulatory Visit (HOSPITAL_COMMUNITY)
Admission: RE | Admit: 2020-03-28 | Discharge: 2020-03-28 | Disposition: A | Discharge status: Home / Self Care | Payer: Medicare Other | Source: Ambulatory Visit | Attending: Cardiovascular Disease | Admitting: Cardiovascular Disease

## 2020-03-28 ENCOUNTER — Encounter: Payer: Self-pay | Admitting: Cardiology

## 2020-03-28 VITALS — BP 114/58 | HR 70 | Ht 65.0 in | Wt 179.0 lb

## 2020-03-28 DIAGNOSIS — E7800 Pure hypercholesterolemia, unspecified: Secondary | ICD-10-CM | POA: Diagnosis not present

## 2020-03-28 DIAGNOSIS — R42 Dizziness and giddiness: Secondary | ICD-10-CM

## 2020-03-28 DIAGNOSIS — R601 Generalized edema: Secondary | ICD-10-CM | POA: Diagnosis present

## 2020-03-28 MED ORDER — FUROSEMIDE 20 MG PO TABS: 20.0000 mg | ORAL_TABLET | ORAL | 3 refills | Status: DC

## 2020-03-28 NOTE — Patient Instructions (Signed)
Medication Instructions:   STOP HCTZ  START FUROSEMIDE 20 MG ONCE EVERY OTHER DAY  *If you need a refill on your cardiac medications before your next appointment, please call your pharmacy*   Lab Work:  Your physician recommends that you return for lab work in: ONE WEEK  If you have labs (blood work) drawn today and your tests are completely normal, you will receive your results only by: Marland Kitchen MyChart Message (if you have MyChart) OR . A paper copy in the mail If you have any lab test that is abnormal or we need to change your treatment, we will call you to review the results.   Testing/Procedures:  Your physician has requested that you have a lower extremity venous duplex. This test is an ultrasound of the veins in the legs or arms. It looks at venous blood flow that carries blood from the heart to the legs or arms. Allow one hour for a Lower Venous exam. There are no restrictions or special instructions. [facility]  Your physician has requested that you have an echocardiogram. Echocardiography is a painless test that uses sound waves to create images of your heart. It provides your doctor with information about the size and shape of your heart and how well your heart's chambers and valves are working. This procedure takes approximately one hour. There are no restrictions for this procedure.[address]    Follow-Up: At [facility], you and your health needs are our priority.  As part of our continuing mission to provide you with exceptional heart care, we have created designated Provider Care Teams.  These Care Teams include your primary Cardiologist (physician) and Advanced Practice Providers (APPs -  Physician Assistants and Nurse Practitioners) who all work together to provide you with the care you need, when you need it.  We recommend signing up for the patient portal called "MyChart".  Sign up information is provided on this After Visit Summary.  MyChart is used  to connect with patients for Virtual Visits (Telemedicine).  Patients are able to view lab/test results, encounter notes, upcoming appointments, etc.  Non-urgent messages can be sent to your provider as well.   To learn more about what you can do with MyChart, go to [email or web address].    Your next appointment:   4-6 month(s)  The format for your next appointment:   In Person  Provider:   Olga Millers, MD

## 2020-04-07 ENCOUNTER — Encounter: Payer: Self-pay | Admitting: *Deleted

## 2020-04-07 LAB — BASIC METABOLIC PANEL
BUN/Creatinine Ratio: 29 — ABNORMAL HIGH (ref 12–28)
BUN: 21 mg/dL (ref 8–27)
CO2: 22 mmol/L (ref 20–29)
Calcium: 9.7 mg/dL (ref 8.7–10.3)
Chloride: 105 mmol/L (ref 96–106)
Creatinine, Ser: 0.73 mg/dL (ref 0.57–1.00)
Glucose: 101 mg/dL — ABNORMAL HIGH (ref 65–99)
Potassium: 4.7 mmol/L (ref 3.5–5.2)
Sodium: 143 mmol/L (ref 134–144)
eGFR: 90 mL/min/{1.73_m2} (ref >59)

## 2020-04-25 ENCOUNTER — Ambulatory Visit (HOSPITAL_COMMUNITY): Payer: Medicare Other | Attending: Cardiovascular Disease

## 2020-04-25 ENCOUNTER — Other Ambulatory Visit: Payer: Self-pay

## 2020-04-25 DIAGNOSIS — R601 Generalized edema: Secondary | ICD-10-CM | POA: Insufficient documentation

## 2020-04-25 DIAGNOSIS — R600 Localized edema: Secondary | ICD-10-CM | POA: Diagnosis not present

## 2020-04-25 LAB — ECHOCARDIOGRAM COMPLETE
Area-P 1/2: 3.51 cm2
S' Lateral: 2.7 cm

## 2020-04-27 ENCOUNTER — Encounter: Payer: Self-pay | Admitting: *Deleted

## 2020-06-28 ENCOUNTER — Other Ambulatory Visit (HOSPITAL_COMMUNITY): Payer: Self-pay | Admitting: Neurosurgery

## 2020-06-28 DIAGNOSIS — M8008XA Age-related osteoporosis with current pathological fracture, vertebra(e), initial encounter for fracture: Secondary | ICD-10-CM

## 2020-06-29 ENCOUNTER — Other Ambulatory Visit: Payer: Self-pay | Admitting: Neurosurgery

## 2020-06-29 DIAGNOSIS — M8008XA Age-related osteoporosis with current pathological fracture, vertebra(e), initial encounter for fracture: Secondary | ICD-10-CM

## 2020-07-14 ENCOUNTER — Ambulatory Visit
Admission: RE | Admit: 2020-07-14 | Discharge: 2020-07-14 | Disposition: A | Discharge status: Home / Self Care | Payer: Medicare Other | Source: Ambulatory Visit | Attending: Neurosurgery | Admitting: Neurosurgery

## 2020-07-14 DIAGNOSIS — M8008XA Age-related osteoporosis with current pathological fracture, vertebra(e), initial encounter for fracture: Secondary | ICD-10-CM

## 2020-07-28 ENCOUNTER — Ambulatory Visit: Payer: Medicare Other | Admitting: Cardiology

## 2020-12-11 NOTE — Progress Notes (Signed)
HPI: FU dizziness and edema.  Lower extremity venous Dopplers March 2022 showed no DVT.  Echocardiogram April 2022 showed normal LV function, mild to moderate left atrial enlargement.  At previous office visit lower extremity edema was felt secondary to lymphedema.  We added Lasix at that time.  Since last seen she denies dyspnea, chest pain, palpitations or syncope.  She continues to have some dizziness predominantly in the mornings for 2 hours.  Her pedal edema is much improved compared to previous.  Current Outpatient Medications  Medication Sig Dispense Refill   acetaminophen (TYLENOL) 325 MG tablet Take 2 tablets (650 mg total) by mouth every 4 (four) hours as needed for mild pain ((score 1 to 3) or temp > 100.5).     bismuth subsalicylate (PEPTO BISMOL) 262 MG chewable tablet Chew 524 mg by mouth as needed for indigestion or diarrhea or loose stools.     ferrous sulfate 325 (65 FE) MG tablet Take 1 tablet (325 mg total) by mouth 2 (two) times daily with a meal. 60 tablet 3   furosemide (LASIX) 20 MG tablet Take 1 tablet (20 mg total) by mouth every other day. 45 tablet 3   meloxicam (MOBIC) 7.5 MG tablet TAKE 2 TABLETS BY MOUTH EVERY DAY AS NEEDED     methocarbamol (ROBAXIN) 500 MG tablet Take 500 mg by mouth 4 (four) times daily.     omeprazole (PRILOSEC OTC) 20 MG tablet Take 40 mg by mouth daily.     oxyCODONE (OXY IR/ROXICODONE) 5 MG immediate release tablet Take 5 mg by mouth every 4 (four) hours as needed for severe pain.     pregabalin (LYRICA) 50 MG capsule Take 1 capsule (50 mg total) by mouth 3 (three) times daily. 90 capsule 2   rOPINIRole (REQUIP) 4 MG tablet TAKE 1 TABLET BY MOUTH ONCE DAILY AT BEDTIME 30 tablet 0   rosuvastatin (CRESTOR) 5 MG tablet Take by mouth.     No current facility-administered medications for this visit.     Past Medical History:  Diagnosis Date   Anxiety    situitional   Arthritis    Back pain [id number]   Complication of anesthesia     Degenerative lumbar spinal stenosis    Disc displacement, lumbar    GERD (gastroesophageal reflux disease)    High cholesterol    Hip pain 02/2017   Leg pain    While walking   Lumbar radiculopathy    Osteopenia 10/01/2017   PONV (postoperative nausea and vomiting)    RLS (restless legs syndrome)    Sciatic notch pain, right    Scoliosis    Spondylolisthesis    Lumber region   Staph infection 2012   Finger    Past Surgical History:  Procedure Laterality Date   ABDOMINAL EXPOSURE N/A 01/15/2018   Procedure: ABDOMINAL EXPOSURE;  Surgeon: Serafina Mitchell, MD;  Location: [place];  Service: Vascular;  Laterality: N/A;   ANTERIOR LATERAL LUMBAR FUSION 4 LEVELS Right 01/15/2018   Procedure: Right Anterior Lateral Lumbar Interbody Fusion Lumbar One-Two, Two-Three, Three-Four, and Four-Five;  Surgeon: Erline Levine, MD;  Location: [place];  Service: Neurosurgery;  Laterality: Right;  Right Anterior Lateral Lumbar Interbody Fusion Lumbar One-Two, Two-Three, Three-Four, and Four-Five    ANTERIOR LUMBAR FUSION N/A 01/15/2018   Procedure: Lumbar Five to Sacral One  Anterior lumbar interbody fusion;  Surgeon: Erline Levine, MD;  Location: [place];  Service: Neurosurgery;  Laterality: N/A;  Lumbar Five to Sacral  One  Anterior lumbar interbody fusion   APPLICATION OF INTRAOPERATIVE CT SCAN N/A 01/19/2018   Procedure: APPLICATION OF INTRAOPERATIVE CT SCAN;  Surgeon: Maeola Harman, MD;  Location: [facility] OR;  Service: Neurosurgery;  Laterality: N/A;   COLONOSCOPY     FINGER SURGERY Right    infection   POSTERIOR LUMBAR FUSION 4 LEVEL N/A 01/19/2018   Procedure: Thoracic Eight to pelvis fixation with Airo;  Surgeon: Maeola Harman, MD;  Location: [facility] OR;  Service: Neurosurgery;  Laterality: N/A;    Social History   Socioeconomic History   Marital status: Married    Spouse name: Not on file   Number of children: 4   Years of education: Not on file   Highest education level: Not on file  Occupational History    Not on file  Tobacco Use   Smoking status: Never   Smokeless tobacco: Never  Vaping Use   Vaping Use: Never used  Substance and Sexual Activity   Alcohol use: Not Currently   Drug use: Never   Sexual activity: Not on file  Other Topics Concern   Not on file  Social History Narrative   Not on file   Social Determinants of Health   Financial Resource Strain: Not on file  Food Insecurity: Not on file  Transportation Needs: Not on file  Physical Activity: Not on file  Stress: Not on file  Social Connections: Not on file  Intimate Partner Violence: Not on file    Family History  Problem Relation Age of Onset   Cancer Mother    Alzheimer's disease Mother     ROS: no fevers or chills, productive cough, hemoptysis, dysphasia, odynophagia, melena, hematochezia, dysuria, hematuria, rash, seizure activity, orthopnea, PND, pedal edema, claudication. Remaining systems are negative.  Physical Exam: Well-developed well-nourished in no acute distress.  Skin is warm and dry.  HEENT is normal.  Neck is supple.  Chest is clear to auscultation with normal expansion.  Cardiovascular exam is regular rate and rhythm.  Abdominal exam nontender or distended. No masses palpated. Extremities show no edema. neuro grossly intact  A/P  1 lower extremity edema-we will change Lasix to 20 mg every other day as needed.  Check potassium and renal function.  Continue feet elevation and compression hose.  2 dizziness-previously felt likely not to be cardiac.  Symptoms unchanged.  3 hyperlipidemia-Per primary care.  Continue statin.  Olga Millers, MD

## 2020-12-12 ENCOUNTER — Other Ambulatory Visit: Payer: Self-pay

## 2020-12-12 ENCOUNTER — Ambulatory Visit
Admission: RE | Admit: 2020-12-12 | Discharge: 2020-12-12 | Disposition: A | Discharge status: Home / Self Care | Payer: Medicare Other | Source: Ambulatory Visit | Attending: Neurosurgery | Admitting: Neurosurgery

## 2020-12-12 DIAGNOSIS — M8008XA Age-related osteoporosis with current pathological fracture, vertebra(e), initial encounter for fracture: Secondary | ICD-10-CM

## 2020-12-18 ENCOUNTER — Other Ambulatory Visit: Payer: Self-pay

## 2020-12-18 ENCOUNTER — Ambulatory Visit (INDEPENDENT_AMBULATORY_CARE_PROVIDER_SITE_OTHER): Payer: Medicare Other | Admitting: Cardiology

## 2020-12-18 ENCOUNTER — Encounter: Payer: Self-pay | Admitting: Cardiology

## 2020-12-18 VITALS — BP 138/64 | HR 94 | Ht 65.0 in | Wt 175.2 lb

## 2020-12-18 DIAGNOSIS — R42 Dizziness and giddiness: Secondary | ICD-10-CM | POA: Diagnosis not present

## 2020-12-18 DIAGNOSIS — R601 Generalized edema: Secondary | ICD-10-CM

## 2020-12-18 DIAGNOSIS — E7800 Pure hypercholesterolemia, unspecified: Secondary | ICD-10-CM

## 2020-12-18 MED ORDER — FUROSEMIDE 20 MG PO TABS: ORAL_TABLET | ORAL | 3 refills | Status: DC

## 2020-12-18 NOTE — Patient Instructions (Signed)
Medication Instructions:   TAKE FUROSEMIDE 20 MG EVERY OTHER DAY AS NEEDED  *If you need a refill on your cardiac medications before your next appointment, please call your pharmacy*   Follow-Up: At [facility], you and your health needs are our priority.  As part of our continuing mission to provide you with exceptional heart care, we have created designated Provider Care Teams.  These Care Teams include your primary Cardiologist (physician) and Advanced Practice Providers (APPs -  Physician Assistants and Nurse Practitioners) who all work together to provide you with the care you need, when you need it.  We recommend signing up for the patient portal called "MyChart".  Sign up information is provided on this After Visit Summary.  MyChart is used to connect with patients for Virtual Visits (Telemedicine).  Patients are able to view lab/test results, encounter notes, upcoming appointments, etc.  Non-urgent messages can be sent to your provider as well.   To learn more about what you can do with MyChart, go to [email or web address].    Your next appointment:    AS NEEDED

## 2020-12-19 ENCOUNTER — Encounter: Payer: Self-pay | Admitting: *Deleted

## 2020-12-19 LAB — BASIC METABOLIC PANEL
BUN/Creatinine Ratio: 30 — ABNORMAL HIGH (ref 12–28)
BUN: 20 mg/dL (ref 8–27)
CO2: 26 mmol/L (ref 20–29)
Calcium: 9.6 mg/dL (ref 8.7–10.3)
Chloride: 104 mmol/L (ref 96–106)
Creatinine, Ser: 0.66 mg/dL (ref 0.57–1.00)
Glucose: 86 mg/dL (ref 70–99)
Potassium: 4.5 mmol/L (ref 3.5–5.2)
Sodium: 143 mmol/L (ref 134–144)
eGFR: 95 mL/min/{1.73_m2} (ref >59)

## 2021-02-22 ENCOUNTER — Other Ambulatory Visit: Payer: Self-pay | Admitting: Cardiology

## 2021-02-22 DIAGNOSIS — R601 Generalized edema: Secondary | ICD-10-CM

## 2021-11-21 IMAGING — CT CT PELVIS W/O CM
1 series · 15 of 32 positions shown, 19 images · non-contrast
Comparison: None.

CLINICAL DATA: Right-sided pain.

EXAM:
CT PELVIS WITHOUT CONTRAST
TECHNIQUE: Multidetector CT imaging of the pelvis was performed following the
standard protocol without intravenous contrast.

[Series 3: soft tissue pelvis/hip (person_name) · axial · 0.81mm/px · z∈[-267,-51]mm · 15 of 81 slices shown, 19 images]
[im 6/81  soft-tissue]
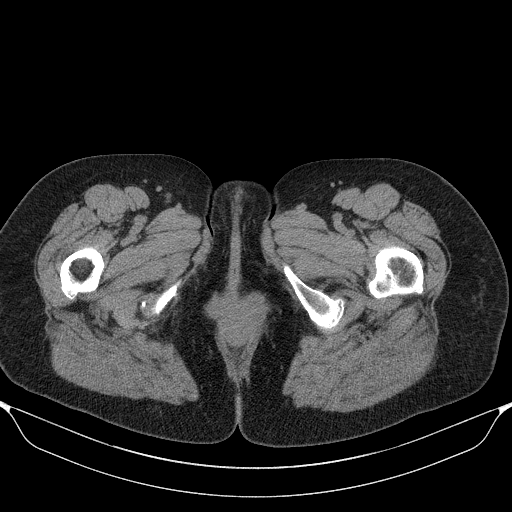
[im 6/81  bone]
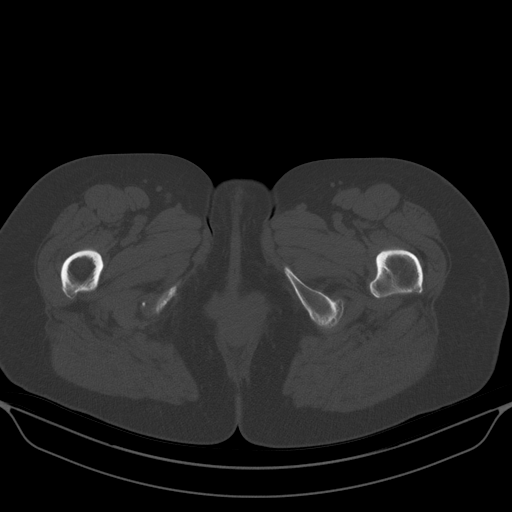
[im 11/81  soft-tissue]
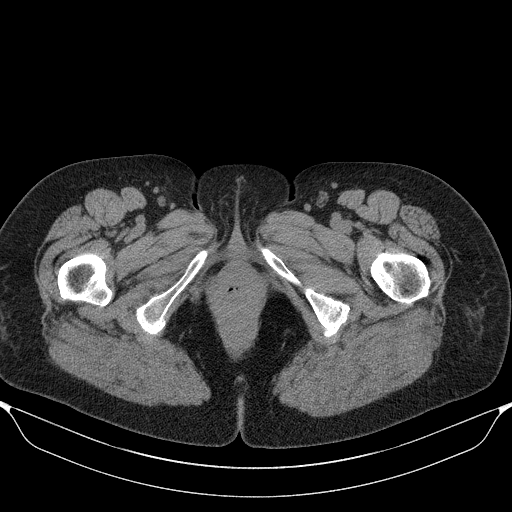
[im 16/81  soft-tissue]
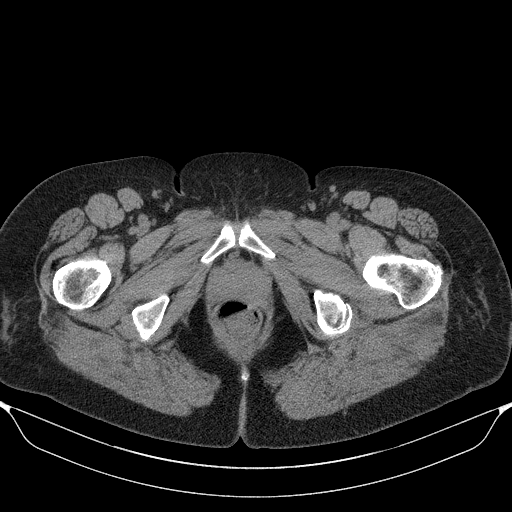
[im 24/81  soft-tissue]
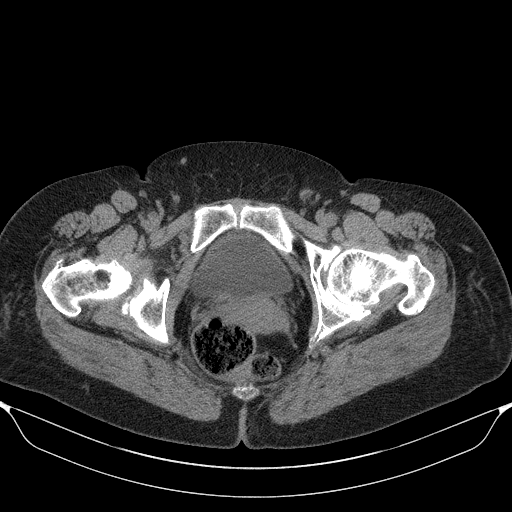
[im 29/81  soft-tissue]
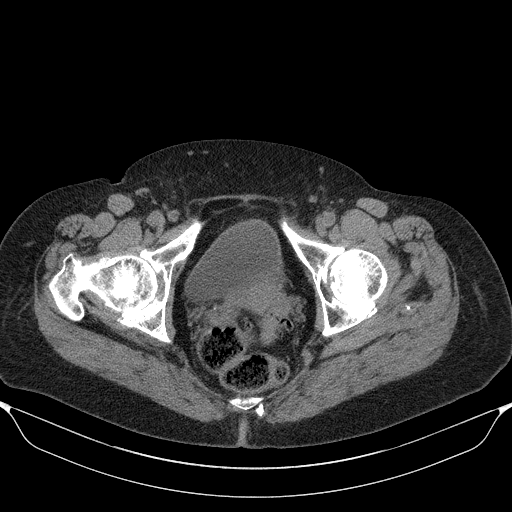
[im 34/81  soft-tissue]
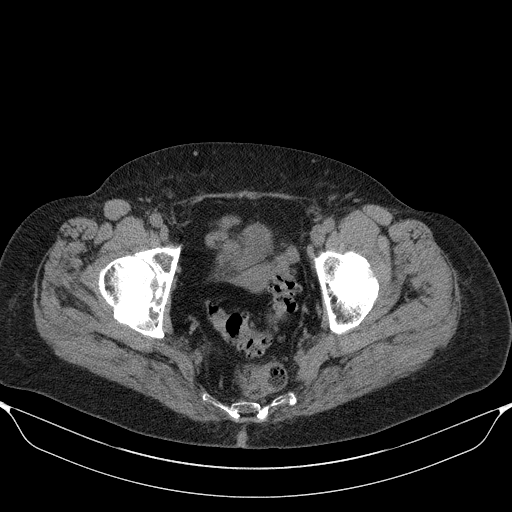
[im 42/81  soft-tissue]
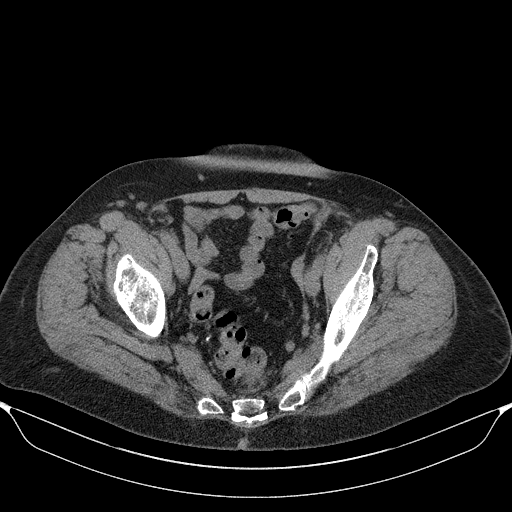
[im 47/81  soft-tissue]
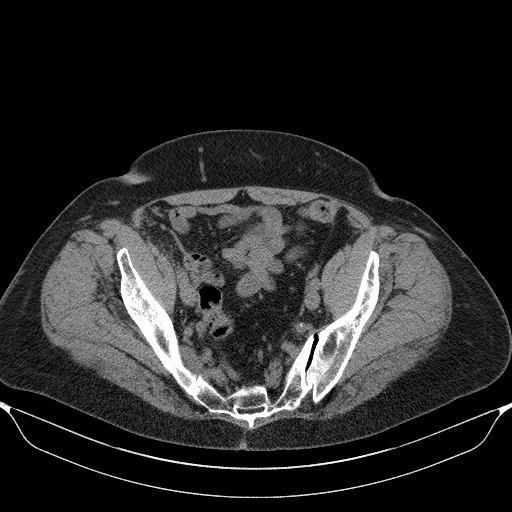
[im 52/81  soft-tissue]
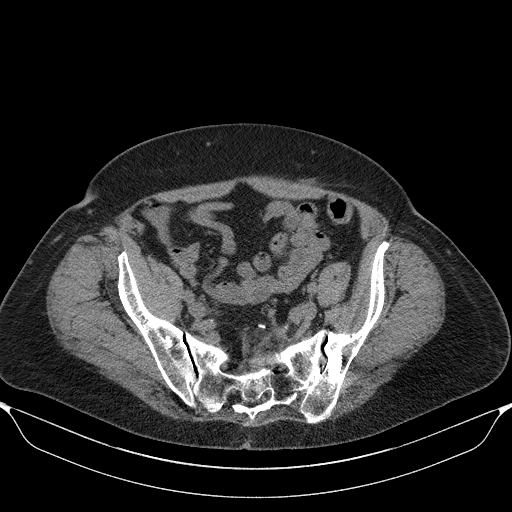
[im 52/81  bone]
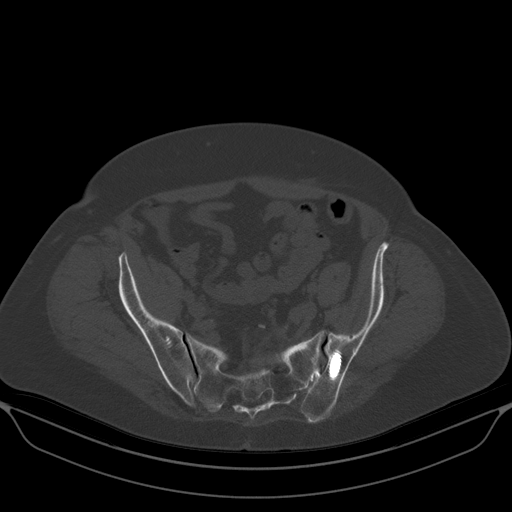
[im 57/81  soft-tissue]
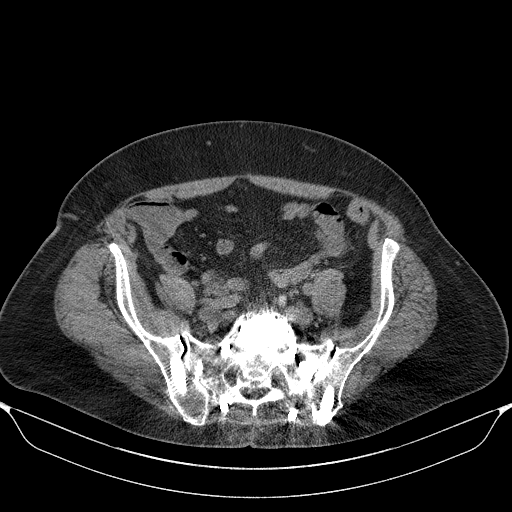
[im 65/81  soft-tissue]
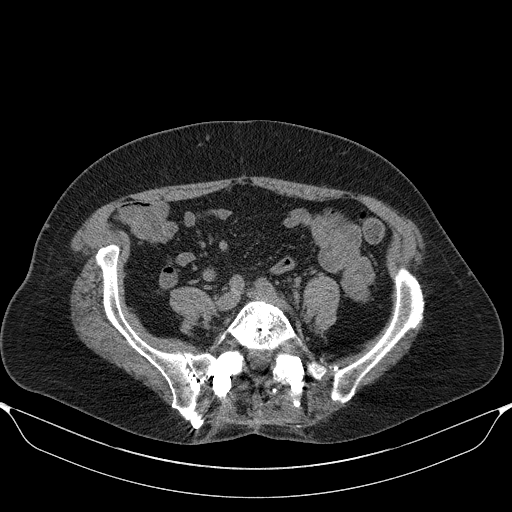
[im 70/81  soft-tissue]
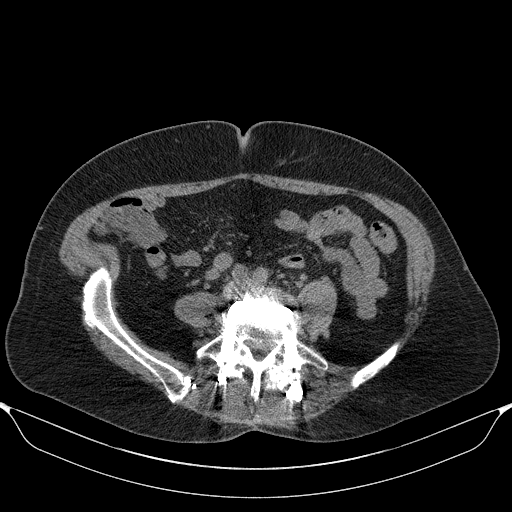
[im 70/81  lung]
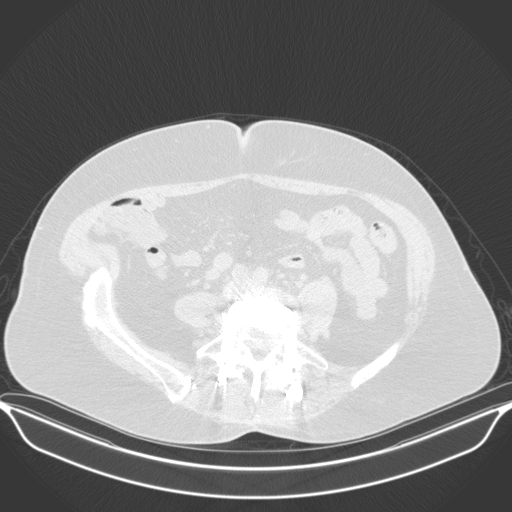
[im 73/81  lung]
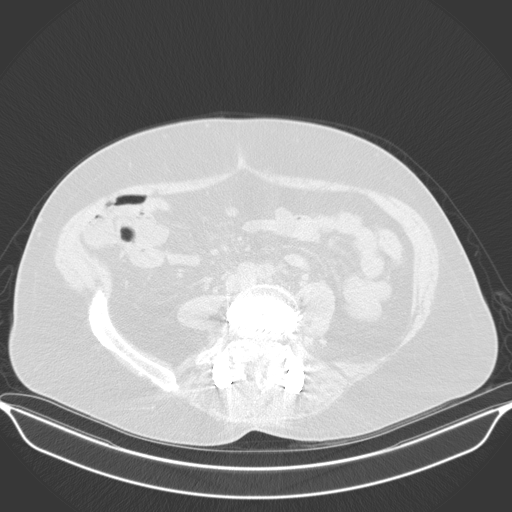
[im 75/81  soft-tissue]
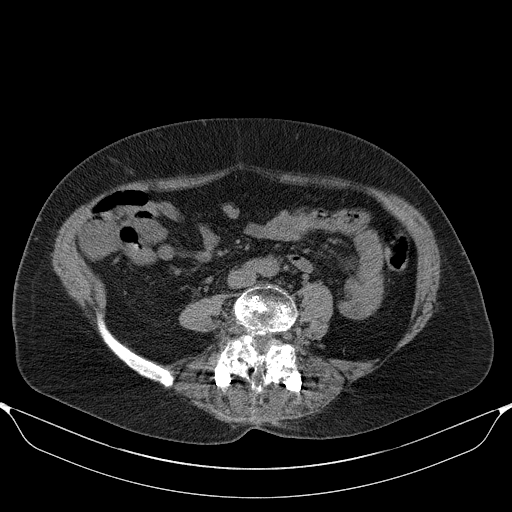
[im 75/81  lung]
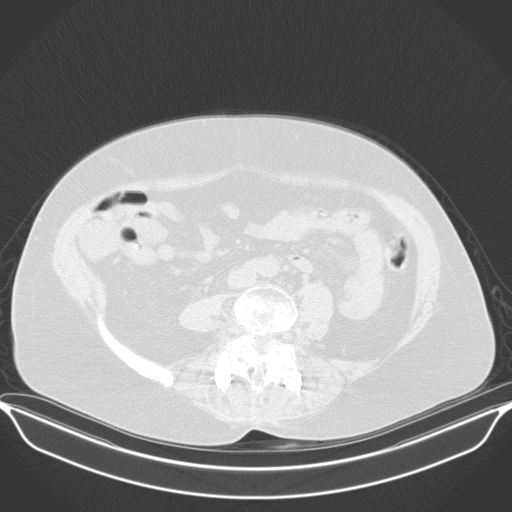
[im 78/81  lung]
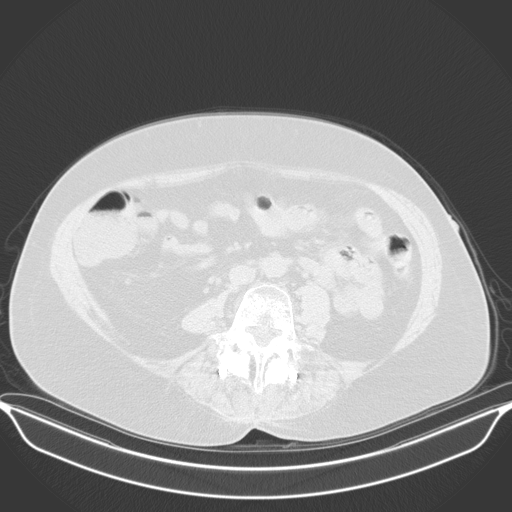

[15 of 32 positions shown; findings below may reference images not displayed]

FINDINGS: Bones/Joint/Cartilage

Posterior spinal fusion of the lumbosacral spine with bilateral
pedicle screws and bilateral sacral screws. Anterior lumbar
interbody fusion at L5-S1. Methylmethacrylate within the bilateral
sacral ala from prior sacroplasty.

Subacute-chronic S1 vertebral body fracture. No acute fracture or
dislocation. Normal alignment. No joint effusion. Generalized
osteopenia. Moderate osteoarthritis of bilateral hips.

Ligaments

Ligaments are suboptimally evaluated by CT.

Muscles and Tendons
Muscles are normal.

Soft tissue
No fluid collection or hematoma. No soft tissue mass.
IMPRESSION: 1. No acute osseous injury of the pelvis.
2. Moderate osteoarthritis of bilateral hips.
3. Posterior spinal fusion of the lumbosacral spine with bilateral
pedicle screws and bilateral sacral screws. Anterior lumbar
interbody fusion at L5-S1. Methylmethacrylate within the bilateral
sacral ala from prior sacroplasty.
4. Subacute-chronic S1 vertebral body fracture.

## 2021-12-27 ENCOUNTER — Other Ambulatory Visit: Payer: Self-pay | Admitting: Cardiology

## 2021-12-27 DIAGNOSIS — R601 Generalized edema: Secondary | ICD-10-CM

## 2022-09-26 ENCOUNTER — Other Ambulatory Visit: Payer: Self-pay | Admitting: Neurological Surgery

## 2023-01-13 ENCOUNTER — Other Ambulatory Visit: Payer: Self-pay | Admitting: Neurological Surgery

## 2023-01-21 NOTE — Progress Notes (Signed)
 Surgical Instructions   Your procedure is scheduled on Tuesday, January 21st, 2025. Report to [facility] Main Entrance "A" at 5:30 A.M., then check in with the Admitting office. Any questions or running late day of surgery: call [phone]  Questions prior to your surgery date: call [phone], Monday-Friday, 8am-4pm. If you experience any cold or flu symptoms such as cough, fever, chills, shortness of breath, etc. between now and your scheduled surgery, please notify us  at the above number.     Remember:  Do not eat or drink after midnight the night before your surgery    Take these medicines the morning of surgery with A SIP OF WATER: Methocarbamol  (Robaxin ) Pregabalin  (Lyrica ) Ropinirole  (Requip ) Rosuvastatin  (Crestor )   May take these medicines IF NEEDED: Acetaminophen  (Tylenol ) Oxycodone  (Oxy IR/Roxicodone )    One week prior to surgery, STOP taking any Aspirin (unless otherwise instructed by your surgeon) Aleve, Naproxen, Ibuprofen, Motrin, Advil, Goody's, BC's, all herbal medications, fish oil, and non-prescription vitamins.  This includes Meloxicam (Mobic).                     Do NOT Smoke (Tobacco/Vaping) for 24 hours prior to your procedure.  If you use a CPAP at night, you may bring your mask/headgear for your overnight stay.   You will be asked to remove any contacts, glasses, piercing's, hearing aid's, dentures/partials prior to surgery. Please bring cases for these items if needed.    Patients discharged the day of surgery will not be allowed to drive home, and someone needs to stay with them for 24 hours.  SURGICAL WAITING ROOM VISITATION Patients may have no more than 2 support people in the waiting area - these visitors may rotate.   Pre-op nurse will coordinate an appropriate time for 1 ADULT support person, who may not rotate, to accompany patient in pre-op.  Children under the age of 44 must have an adult with them who is not the patient and must  remain in the main waiting area with an adult.  If the patient needs to stay at the hospital during part of their recovery, the visitor guidelines for inpatient rooms apply.  Please refer to the [facility] website for the visitor guidelines for any additional information.   If you received a COVID test during your pre-op visit  it is requested that you wear a mask when out in public, stay away from anyone that may not be feeling well and notify your surgeon if you develop symptoms. If you have been in contact with anyone that has tested positive in the last 10 days please notify you surgeon.      Pre-operative 5 CHG Bathing Instructions   You can play a key role in reducing the risk of infection after surgery. Your skin needs to be as free of germs as possible. You can reduce the number of germs on your skin by washing with CHG (chlorhexidine  gluconate) soap before surgery. CHG is an antiseptic soap that kills germs and continues to kill germs even after washing.   DO NOT use if you have an allergy to chlorhexidine /CHG or antibacterial soaps. If your skin becomes reddened or irritated, stop using the CHG and notify one of our RNs at [phone].   Please shower with the CHG soap starting 4 days before surgery using the following schedule:     Please keep in mind the following:  DO NOT shave, including legs and underarms, starting the day of your first shower.  You may shave your face at any point before/day of surgery.  Place clean sheets on your bed the day you start using CHG soap. Use a clean washcloth (not used since being washed) for each shower. DO NOT sleep with pets once you start using the CHG.   CHG Shower Instructions:  Wash your face and private area with normal soap. If you choose to wash your hair, wash first with your normal shampoo.  After you use shampoo/soap, rinse your hair and body thoroughly to remove shampoo/soap residue.  Turn the water OFF and apply about 3  tablespoons (45 ml) of CHG soap to a CLEAN washcloth.  Apply CHG soap ONLY FROM YOUR NECK DOWN TO YOUR TOES (washing for 3-5 minutes)  DO NOT use CHG soap on face, private areas, open wounds, or sores.  Pay special attention to the area where your surgery is being performed.  If you are having back surgery, having someone wash your back for you may be helpful. Wait 2 minutes after CHG soap is applied, then you may rinse off the CHG soap.  Pat dry with a clean towel  Put on clean clothes/pajamas   If you choose to wear lotion, please use ONLY the CHG-compatible lotions that are listed below.  Additional instructions for the day of surgery: DO NOT APPLY any lotions, deodorants, cologne, or perfumes.   Do not bring valuables to the hospital. [facility] is not responsible for any belongings/valuables. Do not wear nail polish, gel polish, artificial nails, or any other type of covering on natural nails (fingers and toes) Do not wear jewelry or makeup Put on clean/comfortable clothes.  Please brush your teeth.  Ask your nurse before applying any prescription medications to the skin.     CHG Compatible Lotions   Aveeno Moisturizing lotion  Cetaphil Moisturizing Cream  Cetaphil Moisturizing Lotion  Clairol Herbal Essence Moisturizing Lotion, Dry Skin  Clairol Herbal Essence Moisturizing Lotion, Extra Dry Skin  Clairol Herbal Essence Moisturizing Lotion, Normal Skin  Curel Age Defying Therapeutic Moisturizing Lotion with Alpha Hydroxy  Curel Extreme Care Body Lotion  Curel Soothing Hands Moisturizing Hand Lotion  Curel Therapeutic Moisturizing Cream, Fragrance-Free  Curel Therapeutic Moisturizing Lotion, Fragrance-Free  Curel Therapeutic Moisturizing Lotion, Original Formula  Eucerin Daily Replenishing Lotion  Eucerin Dry Skin Therapy Plus Alpha Hydroxy Crme  Eucerin Dry Skin Therapy Plus Alpha Hydroxy Lotion  Eucerin Original Crme  Eucerin Original Lotion  Eucerin Plus Crme  Eucerin Plus Lotion  Eucerin TriLipid Replenishing Lotion  Keri Anti-Bacterial Hand Lotion  Keri Deep Conditioning Original Lotion Dry Skin Formula Softly Scented  Keri Deep Conditioning Original Lotion, Fragrance Free Sensitive Skin Formula  Keri Lotion Fast Absorbing Fragrance Free Sensitive Skin Formula  Keri Lotion Fast Absorbing Softly Scented Dry Skin Formula  Keri Original Lotion  Keri Skin Renewal Lotion Keri Silky Smooth Lotion  Keri Silky Smooth Sensitive Skin Lotion  Nivea Body Creamy Conditioning Oil  Nivea Body Extra Enriched Lotion  Nivea Body Original Lotion  Nivea Body Sheer Moisturizing Lotion Nivea Crme  Nivea Skin Firming Lotion  NutraDerm 30 Skin Lotion  NutraDerm Skin Lotion  NutraDerm Therapeutic Skin Cream  NutraDerm Therapeutic Skin Lotion  ProShield Protective Hand Cream  Provon moisturizing lotion  Please read over the following fact sheets that you were given.

## 2023-01-22 ENCOUNTER — Encounter (HOSPITAL_COMMUNITY): Payer: Self-pay

## 2023-01-22 ENCOUNTER — Other Ambulatory Visit: Payer: Self-pay

## 2023-01-22 ENCOUNTER — Encounter (HOSPITAL_COMMUNITY)
Admission: RE | Admit: 2023-01-22 | Discharge: 2023-01-22 | Disposition: A | Discharge status: Home / Self Care | Payer: Medicare Other | Source: Ambulatory Visit | Attending: Neurological Surgery | Admitting: Neurological Surgery

## 2023-01-22 VITALS — BP 138/68 | HR 84 | Temp 98.0°F | Resp 16 | Ht 65.0 in | Wt 180.0 lb

## 2023-01-22 DIAGNOSIS — Z01818 Encounter for other preprocedural examination: Secondary | ICD-10-CM | POA: Insufficient documentation

## 2023-01-22 HISTORY — DX: Depression, unspecified: F32.A

## 2023-01-22 HISTORY — DX: Herpesviral infection, unspecified: B00.9

## 2023-01-22 HISTORY — DX: Dependence on other enabling machines and devices: Z99.89

## 2023-01-22 LAB — CBC
HCT: 38.5 % (ref 36.0–46.0)
Hemoglobin: 12.3 g/dL (ref 12.0–15.0)
MCH: 28.4 pg (ref 26.0–34.0)
MCHC: 31.9 g/dL (ref 30.0–36.0)
MCV: 88.9 fL (ref 80.0–100.0)
Platelets: 257 10*3/uL (ref 150–400)
RBC: 4.33 MIL/uL (ref 3.87–5.11)
RDW: 14.2 % (ref 11.5–15.5)
WBC: 5.8 10*3/uL (ref 4.0–10.5)
nRBC: 0 % (ref 0.0–0.2)

## 2023-01-22 LAB — SURGICAL PCR SCREEN
MRSA, PCR: NEGATIVE
Staphylococcus aureus: POSITIVE — AB

## 2023-01-22 LAB — TYPE AND SCREEN
ABO/RH(D): O POS
Antibody Screen: NEGATIVE

## 2023-01-22 NOTE — Progress Notes (Addendum)
 PCP - Ava Lei, DO Cardiologist -  none  Chest x-ray - n/a EKG - n/a Stress Test - n/a ECHO - 04/25/20 Cardiac Cath - n/a  ICD Pacemaker/Loop - n/a  Sleep Study -  n/a CPAP - none  Diabetes - n/a  Blood Thinner Instructions:  n/a  Aspirin Instructions: n/a  NPO   Anesthesia review: no  STOP now taking any Aspirin (unless otherwise instructed by your surgeon), Aleve, Naproxen, Ibuprofen, Motrin, Mobic, Advil, Goody's, BC's, all herbal medications, fish oil, and all vitamins.   Coronavirus Screening Do you have any of the following symptoms:  Cough yes/no: No Fever (>100.68F)  yes/no: No Runny nose yes/no: No Sore throat yes/no: No Difficulty breathing/shortness of breath  yes/no: No  Have you traveled in the last 14 days and where? yes/no: No  Patient verbalized understanding of instructions that were given to them at the PAT appointment. Patient was also instructed that they will need to review over the PAT instructions again at home before surgery.

## 2023-01-28 ENCOUNTER — Encounter (HOSPITAL_COMMUNITY): Payer: Self-pay | Admitting: Neurological Surgery

## 2023-01-28 ENCOUNTER — Observation Stay (HOSPITAL_COMMUNITY)
Admission: RE | Admit: 2023-01-28 | Discharge: 2023-01-29 | Disposition: A | Discharge status: Home / Self Care | Payer: Medicare Other | Source: Ambulatory Visit | Attending: Neurological Surgery | Admitting: Neurological Surgery

## 2023-01-28 ENCOUNTER — Encounter (HOSPITAL_COMMUNITY)
Admission: RE | Disposition: A | Discharge status: Home / Self Care | Payer: Self-pay | Source: Ambulatory Visit | Attending: Neurological Surgery

## 2023-01-28 ENCOUNTER — Ambulatory Visit (HOSPITAL_COMMUNITY): Payer: Self-pay

## 2023-01-28 ENCOUNTER — Ambulatory Visit (HOSPITAL_BASED_OUTPATIENT_CLINIC_OR_DEPARTMENT_OTHER): Payer: Self-pay

## 2023-01-28 ENCOUNTER — Other Ambulatory Visit: Payer: Self-pay

## 2023-01-28 ENCOUNTER — Ambulatory Visit (HOSPITAL_COMMUNITY): Payer: Medicare Other

## 2023-01-28 DIAGNOSIS — M4802 Spinal stenosis, cervical region: Secondary | ICD-10-CM | POA: Diagnosis not present

## 2023-01-28 DIAGNOSIS — M4722 Other spondylosis with radiculopathy, cervical region: Secondary | ICD-10-CM

## 2023-01-28 DIAGNOSIS — M4712 Other spondylosis with myelopathy, cervical region: Secondary | ICD-10-CM | POA: Insufficient documentation

## 2023-01-28 HISTORY — PX: ANTERIOR CERVICAL DECOMP/DISCECTOMY FUSION: SHX1161

## 2023-01-28 SURGERY — ANTERIOR CERVICAL DECOMPRESSION/DISCECTOMY FUSION 1 LEVEL
Anesthesia: General | Site: Neck

## 2023-01-28 MED ORDER — MUPIROCIN 2 % EX OINT: 1.0000 | TOPICAL_OINTMENT | Freq: Two times a day (BID) | CUTANEOUS | 0 refills | Status: AC

## 2023-01-28 MED ORDER — ROPINIROLE HCL 1 MG PO TABS
6.0000 mg | ORAL_TABLET | Freq: Every day | ORAL | Status: DC
  Administered 2023-01-28: 6 mg via ORAL
  Filled 2023-01-28 (×2): qty 6

## 2023-01-28 MED ORDER — MIDAZOLAM HCL 2 MG/2ML IJ SOLN
INTRAMUSCULAR | Status: DC | PRN
  Administered 2023-01-28: 2 mg via INTRAVENOUS

## 2023-01-28 MED ORDER — PHENOL 1.4 % MT LIQD: 1.0000 | OROMUCOSAL | Status: DC | PRN

## 2023-01-28 MED ORDER — KETOROLAC TROMETHAMINE 15 MG/ML IJ SOLN
7.5000 mg | Freq: Four times a day (QID) | INTRAMUSCULAR | Status: AC
  Administered 2023-01-28 – 2023-01-29 (×4): 7.5 mg via INTRAVENOUS
  Filled 2023-01-28 (×4): qty 1

## 2023-01-28 MED ORDER — FUROSEMIDE 20 MG PO TABS
20.0000 mg | ORAL_TABLET | Freq: Every day | ORAL | Status: DC
  Administered 2023-01-28: 20 mg via ORAL
  Filled 2023-01-28: qty 1

## 2023-01-28 MED ORDER — PREGABALIN 25 MG PO CAPS
50.0000 mg | ORAL_CAPSULE | Freq: Three times a day (TID) | ORAL | Status: DC
  Administered 2023-01-28 – 2023-01-29 (×3): 50 mg via ORAL
  Filled 2023-01-28 (×3): qty 2

## 2023-01-28 MED ORDER — THROMBIN 5000 UNITS EX SOLR
CUTANEOUS | Status: AC
  Filled 2023-01-28: qty 10000

## 2023-01-28 MED ORDER — ACETAMINOPHEN 10 MG/ML IV SOLN
1000.0000 mg | Freq: Once | INTRAVENOUS | Status: DC | PRN
Start: 2023-01-28 — End: 2023-01-28
  Administered 2023-01-28: 1000 mg via INTRAVENOUS

## 2023-01-28 MED ORDER — MENTHOL 3 MG MT LOZG: 1.0000 | LOZENGE | OROMUCOSAL | Status: DC | PRN

## 2023-01-28 MED ORDER — OXYCODONE HCL 5 MG PO TABS
5.0000 mg | ORAL_TABLET | Freq: Once | ORAL | Status: AC | PRN
  Administered 2023-01-28: 5 mg via ORAL

## 2023-01-28 MED ORDER — THROMBIN (RECOMBINANT) 5000 UNITS EX SOLR: CUTANEOUS | Status: DC | PRN

## 2023-01-28 MED ORDER — SENNA 8.6 MG PO TABS
1.0000 | ORAL_TABLET | Freq: Two times a day (BID) | ORAL | Status: DC | PRN
  Administered 2023-01-28: 8.6 mg via ORAL
  Filled 2023-01-28: qty 1

## 2023-01-28 MED ORDER — CHLORHEXIDINE GLUCONATE 0.12 % MT SOLN
15.0000 mL | Freq: Once | OROMUCOSAL | Status: AC
Start: 2023-01-28 — End: 2023-01-28
  Administered 2023-01-28: 15 mL via OROMUCOSAL

## 2023-01-28 MED ORDER — POLYETHYLENE GLYCOL 3350 17 G PO PACK: 17.0000 g | PACK | Freq: Every day | ORAL | Status: DC | PRN

## 2023-01-28 MED ORDER — OXYCODONE HCL 5 MG PO TABS
10.0000 mg | ORAL_TABLET | ORAL | Status: DC | PRN
Start: 2023-01-28 — End: 2023-01-29

## 2023-01-28 MED ORDER — OXYCODONE HCL 5 MG PO TABS
ORAL_TABLET | ORAL | Status: AC
  Filled 2023-01-28: qty 1

## 2023-01-28 MED ORDER — FLEET ENEMA RE ENEM: 1.0000 | ENEMA | Freq: Once | RECTAL | Status: DC | PRN

## 2023-01-28 MED ORDER — OXYCODONE HCL 5 MG PO TABS
5.0000 mg | ORAL_TABLET | ORAL | Status: DC | PRN
  Administered 2023-01-28 – 2023-01-29 (×4): 5 mg via ORAL
  Filled 2023-01-28 (×4): qty 1

## 2023-01-28 MED ORDER — DEXAMETHASONE SODIUM PHOSPHATE 10 MG/ML IJ SOLN
INTRAMUSCULAR | Status: AC
  Filled 2023-01-28: qty 1

## 2023-01-28 MED ORDER — FENTANYL CITRATE (PF) 100 MCG/2ML IJ SOLN
25.0000 ug | INTRAMUSCULAR | Status: DC | PRN
  Administered 2023-01-28 (×2): 50 ug via INTRAVENOUS

## 2023-01-28 MED ORDER — ACETAMINOPHEN 325 MG PO TABS
650.0000 mg | ORAL_TABLET | ORAL | Status: DC | PRN
  Administered 2023-01-28: 650 mg via ORAL
  Filled 2023-01-28: qty 2

## 2023-01-28 MED ORDER — ONDANSETRON HCL 4 MG/2ML IJ SOLN
INTRAMUSCULAR | Status: DC | PRN
  Administered 2023-01-28: 4 mg via INTRAVENOUS

## 2023-01-28 MED ORDER — ROSUVASTATIN CALCIUM 5 MG PO TABS
10.0000 mg | ORAL_TABLET | Freq: Every day | ORAL | Status: DC
  Administered 2023-01-28: 10 mg via ORAL
  Filled 2023-01-28: qty 2

## 2023-01-28 MED ORDER — PHENYLEPHRINE HCL-NACL 20-0.9 MG/250ML-% IV SOLN
INTRAVENOUS | Status: DC | PRN
  Administered 2023-01-28: 20 ug/min via INTRAVENOUS

## 2023-01-28 MED ORDER — OXYCODONE HCL 5 MG/5ML PO SOLN: 5.0000 mg | Freq: Once | ORAL | Status: AC | PRN

## 2023-01-28 MED ORDER — PHENYLEPHRINE 80 MCG/ML (10ML) SYRINGE FOR IV PUSH (FOR BLOOD PRESSURE SUPPORT)
PREFILLED_SYRINGE | INTRAVENOUS | Status: DC | PRN
  Administered 2023-01-28: 160 ug via INTRAVENOUS
  Administered 2023-01-28: 80 ug via INTRAVENOUS

## 2023-01-28 MED ORDER — 0.9 % SODIUM CHLORIDE (POUR BTL) OPTIME
TOPICAL | Status: DC | PRN
  Administered 2023-01-28: 1000 mL

## 2023-01-28 MED ORDER — LIDOCAINE 2% (20 MG/ML) 5 ML SYRINGE
INTRAMUSCULAR | Status: AC
  Filled 2023-01-28: qty 5

## 2023-01-28 MED ORDER — ACETAMINOPHEN 650 MG RE SUPP: 650.0000 mg | RECTAL | Status: DC | PRN

## 2023-01-28 MED ORDER — SODIUM CHLORIDE 0.9% FLUSH: 3.0000 mL | INTRAVENOUS | Status: DC | PRN

## 2023-01-28 MED ORDER — DOCUSATE SODIUM 100 MG PO CAPS
100.0000 mg | ORAL_CAPSULE | Freq: Two times a day (BID) | ORAL | Status: DC
  Administered 2023-01-28 (×2): 100 mg via ORAL
  Filled 2023-01-28 (×2): qty 1

## 2023-01-28 MED ORDER — PROPOFOL 10 MG/ML IV BOLUS
INTRAVENOUS | Status: DC | PRN
  Administered 2023-01-28: 90 mg via INTRAVENOUS

## 2023-01-28 MED ORDER — SUCCINYLCHOLINE CHLORIDE 200 MG/10ML IV SOSY
PREFILLED_SYRINGE | INTRAVENOUS | Status: AC
  Filled 2023-01-28: qty 10

## 2023-01-28 MED ORDER — METHOCARBAMOL 500 MG PO TABS
500.0000 mg | ORAL_TABLET | Freq: Four times a day (QID) | ORAL | Status: DC | PRN
  Administered 2023-01-28: 500 mg via ORAL
  Administered 2023-01-29: 250 mg via ORAL
  Filled 2023-01-28 (×2): qty 1

## 2023-01-28 MED ORDER — FENTANYL CITRATE (PF) 250 MCG/5ML IJ SOLN
INTRAMUSCULAR | Status: DC | PRN
  Administered 2023-01-28: 50 ug via INTRAVENOUS
  Administered 2023-01-28: 100 ug via INTRAVENOUS

## 2023-01-28 MED ORDER — CEFAZOLIN SODIUM-DEXTROSE 2-4 GM/100ML-% IV SOLN
2.0000 g | Freq: Four times a day (QID) | INTRAVENOUS | Status: AC
  Administered 2023-01-28 (×2): 2 g via INTRAVENOUS
  Filled 2023-01-28 (×2): qty 100

## 2023-01-28 MED ORDER — ORAL CARE MOUTH RINSE: 15.0000 mL | Freq: Once | OROMUCOSAL | Status: AC

## 2023-01-28 MED ORDER — SODIUM CHLORIDE 0.9% FLUSH
3.0000 mL | Freq: Two times a day (BID) | INTRAVENOUS | Status: DC
  Administered 2023-01-28 (×2): 3 mL via INTRAVENOUS

## 2023-01-28 MED ORDER — METHOCARBAMOL 1000 MG/10ML IJ SOLN: 500.0000 mg | Freq: Four times a day (QID) | INTRAMUSCULAR | Status: DC | PRN

## 2023-01-28 MED ORDER — CHLORHEXIDINE GLUCONATE CLOTH 2 % EX PADS: 6.0000 | MEDICATED_PAD | Freq: Once | CUTANEOUS | Status: DC

## 2023-01-28 MED ORDER — SUGAMMADEX SODIUM 200 MG/2ML IV SOLN
INTRAVENOUS | Status: DC | PRN
  Administered 2023-01-28: 200 mg via INTRAVENOUS

## 2023-01-28 MED ORDER — CEFAZOLIN SODIUM-DEXTROSE 2-4 GM/100ML-% IV SOLN
2.0000 g | INTRAVENOUS | Status: AC
  Administered 2023-01-28: 2 g via INTRAVENOUS
  Filled 2023-01-28: qty 100

## 2023-01-28 MED ORDER — FENTANYL CITRATE (PF) 250 MCG/5ML IJ SOLN
INTRAMUSCULAR | Status: AC
  Filled 2023-01-28: qty 5

## 2023-01-28 MED ORDER — PROPOFOL 10 MG/ML IV BOLUS
INTRAVENOUS | Status: AC
  Filled 2023-01-28: qty 20

## 2023-01-28 MED ORDER — GLYCOPYRROLATE 0.2 MG/ML IJ SOLN
INTRAMUSCULAR | Status: DC | PRN
  Administered 2023-01-28: .2 mg via INTRAVENOUS

## 2023-01-28 MED ORDER — ONDANSETRON HCL 4 MG PO TABS: 4.0000 mg | ORAL_TABLET | Freq: Four times a day (QID) | ORAL | Status: DC | PRN

## 2023-01-28 MED ORDER — DEXAMETHASONE SODIUM PHOSPHATE 10 MG/ML IJ SOLN
INTRAMUSCULAR | Status: DC | PRN
  Administered 2023-01-28: 10 mg via INTRAVENOUS

## 2023-01-28 MED ORDER — THROMBIN 5000 UNITS EX SOLR: OROMUCOSAL | Status: DC | PRN

## 2023-01-28 MED ORDER — ROCURONIUM BROMIDE 10 MG/ML (PF) SYRINGE
PREFILLED_SYRINGE | INTRAVENOUS | Status: DC | PRN
  Administered 2023-01-28: 70 mg via INTRAVENOUS

## 2023-01-28 MED ORDER — MIDAZOLAM HCL 2 MG/2ML IJ SOLN
INTRAMUSCULAR | Status: AC
  Filled 2023-01-28: qty 2

## 2023-01-28 MED ORDER — ONDANSETRON HCL 4 MG/2ML IJ SOLN: 4.0000 mg | Freq: Once | INTRAMUSCULAR | Status: DC | PRN

## 2023-01-28 MED ORDER — PHENYLEPHRINE 80 MCG/ML (10ML) SYRINGE FOR IV PUSH (FOR BLOOD PRESSURE SUPPORT)
PREFILLED_SYRINGE | INTRAVENOUS | Status: AC
  Filled 2023-01-28: qty 10

## 2023-01-28 MED ORDER — THROMBIN 5000 UNITS EX SOLR
CUTANEOUS | Status: AC
  Filled 2023-01-28: qty 5000

## 2023-01-28 MED ORDER — LACTATED RINGERS IV SOLN: INTRAVENOUS | Status: DC

## 2023-01-28 MED ORDER — ONDANSETRON HCL 4 MG/2ML IJ SOLN
INTRAMUSCULAR | Status: AC
  Filled 2023-01-28: qty 2

## 2023-01-28 MED ORDER — POTASSIUM CHLORIDE CRYS ER 10 MEQ PO TBCR
10.0000 meq | EXTENDED_RELEASE_TABLET | Freq: Every day | ORAL | Status: DC
  Administered 2023-01-28: 10 meq via ORAL
  Filled 2023-01-28 (×3): qty 1

## 2023-01-28 MED ORDER — CHLORHEXIDINE GLUCONATE 0.12 % MT SOLN
OROMUCOSAL | Status: AC
  Filled 2023-01-28: qty 15

## 2023-01-28 MED ORDER — HYDROMORPHONE HCL 1 MG/ML IJ SOLN: 0.5000 mg | INTRAMUSCULAR | Status: DC | PRN

## 2023-01-28 MED ORDER — FENTANYL CITRATE (PF) 100 MCG/2ML IJ SOLN
INTRAMUSCULAR | Status: AC
  Filled 2023-01-28: qty 2

## 2023-01-28 MED ORDER — PHENYLEPHRINE HCL (PRESSORS) 10 MG/ML IV SOLN
INTRAVENOUS | Status: DC | PRN
Start: 2023-01-28 — End: 2023-01-28
  Administered 2023-01-28 (×3): 160 ug via INTRAVENOUS

## 2023-01-28 MED ORDER — LIDOCAINE 2% (20 MG/ML) 5 ML SYRINGE
INTRAMUSCULAR | Status: DC | PRN
  Administered 2023-01-28: 100 mg via INTRAVENOUS

## 2023-01-28 MED ORDER — ONDANSETRON HCL 4 MG/2ML IJ SOLN: 4.0000 mg | Freq: Four times a day (QID) | INTRAMUSCULAR | Status: DC | PRN

## 2023-01-28 SURGICAL SUPPLY — 52 items
BAG COUNTER SPONGE SURGICOUNT (BAG) ×1 IMPLANT
BENZOIN TINCTURE PRP APPL 2/3 (GAUZE/BANDAGES/DRESSINGS) IMPLANT
BIT DRILL NEURO 2X3.1 SFT TUCH (MISCELLANEOUS) ×1 IMPLANT
BLADE CLIPPER SURG (BLADE) IMPLANT
BUR CARBIDE MATCH 3.0 (BURR) ×1 IMPLANT
CANISTER SUCT 3000ML PPV (MISCELLANEOUS) ×1 IMPLANT
COVER MAYO STAND STRL (DRAPES) ×1 IMPLANT
DEVICE ENDSKLTN IMPL 16X14X7X6 (Cage) IMPLANT
DRAPE C-ARM 42X72 X-RAY (DRAPES) ×1 IMPLANT
DRAPE HALF SHEET 40X57 (DRAPES) IMPLANT
DRAPE LAPAROTOMY 100X72X124 (DRAPES) ×1 IMPLANT
DRAPE MICROSCOPE SLANT 54X150 (MISCELLANEOUS) ×1 IMPLANT
DRILL NEURO 2X3.1 SOFT TOUCH (MISCELLANEOUS) ×1
DRSG OPSITE POSTOP 4X6 (GAUZE/BANDAGES/DRESSINGS) IMPLANT
DURAPREP 6ML APPLICATOR 50/CS (WOUND CARE) ×1 IMPLANT
ELECT COATED BLADE 2.86 ST (ELECTRODE) ×1 IMPLANT
ELECT REM PT RETURN 9FT ADLT (ELECTROSURGICAL) ×1
ELECTRODE REM PT RTRN 9FT ADLT (ELECTROSURGICAL) ×1 IMPLANT
ENDOSKELETON IMPLANT 16X14X7X6 (Cage) ×1 IMPLANT
EVACUATOR 1/8 PVC DRAIN (DRAIN) IMPLANT
GAUZE 4X4 16PLY ~~LOC~~+RFID DBL (SPONGE) IMPLANT
GLOVE BIO SURGEON STRL SZ7 (GLOVE) ×1 IMPLANT
GLOVE BIOGEL PI IND STRL 7.5 (GLOVE) ×1 IMPLANT
GLOVE BIOGEL PI IND STRL 8 (GLOVE) ×1 IMPLANT
GLOVE ECLIPSE 8.0 STRL XLNG CF (GLOVE) ×2 IMPLANT
GLOVE EXAM NITRILE LRG STRL (GLOVE) IMPLANT
GLOVE EXAM NITRILE XL STR (GLOVE) IMPLANT
GLOVE EXAM NITRILE XS STR PU (GLOVE) IMPLANT
GOWN STRL REUS W/ TWL LRG LVL3 (GOWN DISPOSABLE) IMPLANT
GOWN STRL REUS W/ TWL XL LVL3 (GOWN DISPOSABLE) ×2 IMPLANT
GOWN STRL REUS W/TWL 2XL LVL3 (GOWN DISPOSABLE) IMPLANT
HEMOSTAT POWDER KIT SURGIFOAM (HEMOSTASIS) ×1 IMPLANT
KIT BASIN OR (CUSTOM PROCEDURE TRAY) ×1 IMPLANT
KIT TURNOVER KIT B (KITS) ×1 IMPLANT
NDL SPNL 18GX3.5 QUINCKE PK (NEEDLE) ×1 IMPLANT
NEEDLE SPNL 18GX3.5 QUINCKE PK (NEEDLE) ×1 IMPLANT
NS IRRIG 1000ML POUR BTL (IV SOLUTION) ×1 IMPLANT
PACK LAMINECTOMY NEURO (CUSTOM PROCEDURE TRAY) ×1 IMPLANT
PAD ARMBOARD 7.5X6 YLW CONV (MISCELLANEOUS) ×3 IMPLANT
PIN DISTRACTION 14MM (PIN) ×2 IMPLANT
PLATE ZEVO 1LVL 19MM (Plate) IMPLANT
PUTTY DBF 1CC CORTICAL FIBERS (Putty) IMPLANT
SCREW 3.5 SELFDRILL 15MM VARI (Screw) IMPLANT
SPONGE INTESTINAL PEANUT (DISPOSABLE) ×1 IMPLANT
SPONGE SURGIFOAM ABS GEL SZ50 (HEMOSTASIS) ×1 IMPLANT
STAPLER VISISTAT (STAPLE) IMPLANT
STAPLER VISISTAT 35W (STAPLE) IMPLANT
STRIP CLOSURE SKIN 1/2X4 (GAUZE/BANDAGES/DRESSINGS) ×1 IMPLANT
TAPE SURG TRANSPORE 1 IN (GAUZE/BANDAGES/DRESSINGS) ×1 IMPLANT
TOWEL GREEN STERILE (TOWEL DISPOSABLE) ×1 IMPLANT
TOWEL GREEN STERILE FF (TOWEL DISPOSABLE) ×1 IMPLANT
WATER STERILE IRR 1000ML POUR (IV SOLUTION) ×1 IMPLANT

## 2023-01-28 NOTE — Plan of Care (Signed)

## 2023-01-28 NOTE — Anesthesia Preprocedure Evaluation (Signed)
Anesthesia Evaluation  Patient identified by MRN, date of birth, ID band Patient awake    Reviewed: Allergy & Precautions, NPO status , Patient's Chart, lab work & pertinent test results, reviewed documented beta blocker date and time   History of Anesthesia Complications (+) PONV and history of anesthetic complications  Airway Mallampati: III  TM Distance: >3 FB Neck ROM: Limited    Dental no notable dental hx.    Pulmonary neg COPD, neg PE   breath sounds clear to auscultation       Cardiovascular (-) hypertension(-) angina (-) CAD and (-) Past MI  Rhythm:Regular Rate:Normal     Neuro/Psych neg Seizures PSYCHIATRIC DISORDERS Anxiety Depression     Neuromuscular disease    GI/Hepatic ,GERD  ,,(+) neg Cirrhosis        Endo/Other    Renal/GU Renal disease     Musculoskeletal  (+) Arthritis ,    Abdominal   Peds  Hematology   Anesthesia Other Findings   Reproductive/Obstetrics                              Anesthesia Physical Anesthesia Plan  ASA: 2  Anesthesia Plan: General   Post-op Pain Management:    Induction: Intravenous  PONV Risk Score and Plan: 2 and Ondansetron and Dexamethasone  Airway Management Planned: Oral ETT and Video Laryngoscope Planned  Additional Equipment:   Intra-op Plan:   Post-operative Plan: Extubation in OR  Informed Consent: I have reviewed the patients History and Physical, chart, labs and discussed the procedure including the risks, benefits and alternatives for the proposed anesthesia with the patient or authorized representative who has indicated his/her understanding and acceptance.     Dental advisory given  Plan Discussed with: CRNA  Anesthesia Plan Comments:          Anesthesia Quick Evaluation

## 2023-01-28 NOTE — H&P (Signed)
Providing Compassionate, Quality Care - Together  NEUROSURGERY HISTORY & PHYSICAL   Jade Price is an 73 y.o. female.   Chief Complaint: Cervical myeloradiculopathy HPI: This is a 73 year old female with a history of progressively worsening neck pain, radiating radiculopathy and difficulty with dropping objects.  This has been worsening over many months.  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  Past Medical History:  Diagnosis Date   Anxiety    situitional   Arthritis    Back pain 02/2017   Complication of anesthesia    Degenerative lumbar spinal stenosis    Depression    Disc displacement, lumbar    GERD (gastroesophageal reflux disease)    High cholesterol    crestor   Hip pain 02/2017   HSV infection    Leg pain    While walking   Lumbar radiculopathy    Osteopenia 10/01/2017   PONV (postoperative nausea and vomiting)    RLS (restless legs syndrome)    Sciatic notch pain, right    Scoliosis    Spondylolisthesis    Lumber region   Staph infection 2012   Finger   Uses roller walker     Past Surgical History:  Procedure Laterality Date   ABDOMINAL EXPOSURE N/A 01/15/2018   Procedure: ABDOMINAL EXPOSURE;  Surgeon: Nada Libman, MD;  Location: [facility] OR;  Service: Vascular;  Laterality: N/A;   ANTERIOR LATERAL LUMBAR FUSION 4 LEVELS Right 01/15/2018   Procedure: Right Anterior Lateral Lumbar Interbody Fusion Lumbar One-Two, Two-Three, Three-Four, and Four-Five;  Surgeon: Maeola Harman, MD;  Location[facility] OR;  Service: Neurosurgery;  Laterality: Right;  Right Anterior Lateral Lumbar Interbody Fusion Lumbar One-Two, Two-Three, Three-Four, and Four-Five    ANTERIOR LUMBAR FUSION N/A 01/15/2018   Procedure: Lumbar Five to Sacral One  Anterior lumbar interbody fusion;  Surgeon: Maeola Harman, MD;  Location: [facility] OR;  Service: Neurosurgery;  Laterality: N/A;  Lumbar Five to  Sacral One  Anterior lumbar interbody fusion   APPLICATION OF INTRAOPERATIVE CT SCAN N/A 01/19/2018   Procedure: APPLICATION OF INTRAOPERATIVE CT SCAN;  Surgeon: Maeola Harman, MD;  Location: [facility] OR;  Service: Neurosurgery;  Laterality: N/A;   COLONOSCOPY     DIAGNOSTIC LAPAROSCOPY     FINGER SURGERY Right    infection   POSTERIOR LUMBAR FUSION 4 LEVEL N/A 01/19/2018   Procedure: Thoracic Eight to pelvis fixation with Airo;  Surgeon: Maeola Harman, MD;  Location: [facility] OR;  Service: Neurosurgery;  Laterality: N/A;    Family History  Problem Relation Age of Onset   Cancer Mother    Alzheimer's disease Mother    Social History:  reports that she has never smoked. She has never used smokeless tobacco. She reports that she does not currently use alcohol. She reports that she does not use drugs.  Allergies:  Allergies  Allergen Reactions   Gabapentin Other (See Comments)    Depression within 2 days   Morphine Sulfate Nausea And Vomiting    Medications Prior to Admission  Medication Sig Dispense Refill   acetaminophen (TYLENOL) 325 MG tablet Take 2 tablets (650 mg total) by mouth every 4 (four) hours as needed for mild pain ((score 1 to 3) or temp > 100.5).     Cholecalciferol (VITAMIN D) 50 MCG (2000 UT) CAPS Take 2,000 Units by mouth daily.     furosemide (LASIX) 20 MG tablet TAKE 1 TABLET BY MOUTH EVERY OTHER DAY (Patient  taking differently: Take 20 mg by mouth daily.) 45 tablet 0   ibuprofen (ADVIL) 200 MG tablet Take 200 mg by mouth every 6 (six) hours as needed for moderate pain (pain score 4-6).     meloxicam (MOBIC) 7.5 MG tablet Take 7.5 mg by mouth in the morning and at bedtime.     methocarbamol (ROBAXIN) 500 MG tablet Take 250-500 mg by mouth See admin instructions. Take 250 mg by mouth in the morning and afternoon and take 500 mg at bedtime     oxyCODONE (OXY IR/ROXICODONE) 5 MG immediate release tablet Take 5 mg by mouth in the morning, at noon, and at bedtime.     potassium  chloride (KLOR-CON) 10 MEQ tablet Take 10 mEq by mouth daily.     pregabalin (LYRICA) 50 MG capsule Take 1 capsule (50 mg total) by mouth 3 (three) times daily. 90 capsule 2   rOPINIRole (REQUIP) 2 MG tablet Take 6 mg by mouth daily.     rosuvastatin (CRESTOR) 10 MG tablet Take 10 mg by mouth daily.     ferrous sulfate 325 (65 FE) MG tablet Take 1 tablet (325 mg total) by mouth 2 (two) times daily with a meal. (Patient not taking: Reported on 01/17/2023) 60 tablet 3   rOPINIRole (REQUIP) 4 MG tablet TAKE 1 TABLET BY MOUTH ONCE DAILY AT BEDTIME (Patient not taking: Reported on 01/17/2023) 30 tablet 0    No results found for this or any previous visit (from the past 48 hours). No results found.  ROS All pertinent positives and negatives are listed in HPI above Blood pressure 109/69, pulse 60, temperature 97.9 F (36.6 C), temperature source Oral, resp. rate 16, height 5\' 5"  (1.651 m), weight 81.6 kg, SpO2 96%. 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  We discussed all risks, benefits and expected outcomes.  Informed consent was obtained and witnessed.  She failed multiple conservative measures.  I answered all of her questions as well as her family's questions.   Thank you for allowing me to participate in this patient's care.  Please do not hesitate to call with questions or concerns.   Monia Pouch, DO Neurosurgeon [facility] [phone]

## 2023-01-28 NOTE — Transfer of Care (Signed)
Immediate Anesthesia Transfer of Care Note  Patient: Jade Price  Procedure(s) Performed: ACDF C34 (Neck)  Patient Location: PACU  Anesthesia Type:General  Level of Consciousness: awake, alert , and oriented  Airway & Oxygen Therapy: Patient Spontanous Breathing and Patient connected to face mask oxygen  Post-op Assessment: Report given to RN and Post -op Vital signs reviewed and stable  Post vital signs: Reviewed and stable  Last Vitals:  Vitals Value Taken Time  BP 125/76 01/28/23 0945  Temp 36.4 C 01/28/23 0940  Pulse 73 01/28/23 0946  Resp 14 01/28/23 0946  SpO2 91 % 01/28/23 0946  Vitals shown include unfiled device data.  Last Pain:  Vitals:   01/28/23 0940  TempSrc:   PainSc: Asleep      Patients Stated Pain Goal: 0 (01/28/23 0602)  Complications: No notable events documented.

## 2023-01-28 NOTE — Anesthesia Postprocedure Evaluation (Signed)
Anesthesia Post Note  Patient: Jade Price  Procedure(s) Performed: ACDF C34 (Neck)     Patient location during evaluation: PACU Anesthesia Type: General Level of consciousness: awake and alert Pain management: pain level controlled Vital Signs Assessment: post-procedure vital signs reviewed and stable Respiratory status: spontaneous breathing, nonlabored ventilation, respiratory function stable and patient connected to nasal cannula oxygen Cardiovascular status: blood pressure returned to baseline and stable Postop Assessment: no apparent nausea or vomiting Anesthetic complications: no   No notable events documented.  Last Vitals:  Vitals:   01/28/23 1045 01/28/23 1106  BP: 128/69 (!) 147/84  Pulse: 73 77  Resp: 14 18  Temp: 36.4 C (!) 36.4 C  SpO2: 94% 97%    Last Pain:  Vitals:   01/28/23 1106  TempSrc: Oral  PainSc:                  [name]

## 2023-01-28 NOTE — Progress Notes (Signed)
Orthopedic Tech Progress Note Patient Details:  Jade Price September 21, 1950 [id number]  OR RN called requesting an ASPEN COLLAR   Patient ID: Jade Price, female   DOB: [birth year], 73 y.o.   MRN: [id number]  Donald Pore 01/28/2023, 9:18 AM

## 2023-01-28 NOTE — Progress Notes (Signed)
Dr. Ace Gins made aware that the patient has a single yellow band on her left ring finger that cannot be removed. Ok to leave ring on per Dr. Ace Gins.

## 2023-01-28 NOTE — Anesthesia Procedure Notes (Addendum)
Procedure Name: Intubation Date/Time: 01/28/2023 7:54 AM  Performed by: Camillia Herter, CRNAPre-anesthesia Checklist: Patient identified, Emergency Drugs available, Suction available, Patient being monitored and Timeout performed Patient Re-evaluated:Patient Re-evaluated prior to induction Oxygen Delivery Method: Circle system utilized Preoxygenation: Pre-oxygenation with 100% oxygen Induction Type: IV induction Ventilation: Mask ventilation without difficulty Laryngoscope Size: Glidescope and 3 Grade View: Grade I Tube type: Subglottic suction tube Tube size: 7.0 mm Number of attempts: 1 Airway Equipment and Method: Stylet and Video-laryngoscopy Placement Confirmation: ETT inserted through vocal cords under direct vision, breath sounds checked- equal and bilateral and CO2 detector Secured at: 21 cm Tube secured with: Tape Dental Injury: Teeth and Oropharynx as per pre-operative assessment

## 2023-01-28 NOTE — Op Note (Signed)
Providing Compassionate, Quality Care - Together  Date of service: 01/28/2023  PREOP DIAGNOSIS: Cervical spondylosis, stenosis with myeloradiculopathy, C3-4 POSTOP DIAGNOSIS: Same  PROCEDURE: 1. Arthrodesis C3-4, anterior interbody technique  2. Placement of intervertebral biomechanical device C3-4: Medtronic Titan 7 mm interbody 3. Placement of anterior instrumentation consisting of interbody plate and screws -19 mm plate, 15 mm screws bilaterally at C3 and C4, Medtronic Zevo 4. Discectomy at C3-4 for decompression of spinal cord and exiting nerve roots  5. Use of morselized bone allograft  6. Use of intraoperative microscope 7.  Use of autograft, same incision  SURGEON: Dr. Kendell Bane Sion Thane, DO  ASSISTANT: Patrici Ranks, PA  ANESTHESIA: General Endotracheal  EBL: 10 cc  SPECIMENS: None  DRAINS: None  COMPLICATIONS: None immediate  CONDITION: Hemodynamically stable to PACU  HISTORY: Jade Price is a 73 y.o. y.o. female who initially presented to the outpatient clinic with signs and symptoms consistent with cervical myeloradiculopathy. MRI demonstrated multifactorial cervical spondylosis with severe stenosis at C3-4 with cord compression without cord signal change.  There is multilevel spondylosis below.  She had progressive symptoms of myelopathy including difficulty with balance and dropping objects. Treatment options were discussed including physical therapy, pain control, epidural injections.given her progressive signs and symptoms of myelopathy, I recommended surgical intervention in the form of an ACDF C3-4.  All risks, benefits and expected outcomes were discussed and agreed upon.  After all questions were answered, informed consent was obtained and witnessed.  PROCEDURE IN DETAIL: The patient was brought to the operating room and transferred to the operative table. After induction of general anesthesia, the patient was positioned on the operative table in the supine  position with all pressure points meticulously padded. The skin of the neck was then prepped and draped in the usual sterile fashion.  Physician driven timeout was performed.  After timeout was conducted, skin incision was then made sharply with a 10 blade and Bovie electrocautery was used to dissect the subcutaneous tissue until the platysma was identified. The platysma was then divided and undermined. The sternocleidomastoid muscle was then identified and, utilizing natural fascial planes in the neck, the prevertebral fascia was identified and the carotid sheath was retracted laterally and the trachea and esophagus retracted medially. Again using fluoroscopy, the correct disc space was identified. Bovie electrocautery was used to dissect in the subperiosteal plane and elevate the bilateral longus coli muscles. Self-retaining retractors were then placed under the longus coli muscles bilaterally at C3-4. At this point, the microscope was draped and brought into the field, and the remainder of the case was done under the microscope using microdissecting technique.  Distraction pins were placed in midline above and below the disc space.  The disc  space was placed in distraction.  The disc space was incised sharply and rongeurs were use to initially complete a discectomy. The high-speed drill was then used to complete discectomy until the posterior annulus was identified and removed and the posterior longitudinal ligament was identified. Using microcurettes, the PLL was elevated, and Kerrison rongeurs were used to remove the posterior longitudinal ligament and the ventral thecal sac was identified. Using a combination of curettes and ronguers, complete decompression of the thecal sac and exiting nerve roots at this level was completed, and verified using micro-nerve hook. The disc space was taken out of distraction.  There was a large disc osteophyte complex in the left lateral recess that was removed with  Kerrison rongeurs.  Epidural hemostasis was achieved with Surgifoam.  Having  completed our decompression, attention was turned to placement of the intervertebral device. Trial spacers were used to select a 7 mm graft. This graft was then filled with autograft and morcellized allograft, and inserted under live fluoroscopy.  After placement of the intervertebral device, the above anterior cervical plate was selected, and placed across the interspace. Using a high-speed drill, the cortex of the cervical vertebral bodies was punctured, and screws inserted in the level above and below. Final fluoroscopic images in AP and lateral projections were taken to confirm good hardware placement.  The plate was final tightened to the manufacturer's recommendation and the screws were locked in place.  At this point, after all counts were verified to be correct, meticulous hemostasis was secured using a combination of bipolar electrocautery and passive hemostatics.  Skin was closed with staples.  Sterile dressing was applied.  The patient tolerated the procedure well and was extubated in the room and taken to the postanesthesia care unit in stable condition.

## 2023-01-29 DIAGNOSIS — M4722 Other spondylosis with radiculopathy, cervical region: Secondary | ICD-10-CM | POA: Diagnosis not present

## 2023-01-29 NOTE — Discharge Summary (Signed)
  Patient ID: Jade Price MRN: [id number] DOB/AGE: 71/12/1950 71 y.o.  Admit date: 01/28/2023 Discharge date: 01/29/2023  Admission Diagnoses: Cervical spinal stenosis [M48.02]   Discharge Diagnoses: Same   Discharged Condition: Stable  Hospital Course:  Jade Price is a 73 y.o. female who was admitted following an uncomplicated ACDF C3-4. They were recovered in PACU and transferred to the floor. Hospital course was uncomplicated. Pt stable for discharge today. Pt to f/u in office for routine post op visit. Pt is in agreement w/ plan.    Discharge Exam: Blood pressure 126/68, pulse 79, temperature (!) 97.5 F (36.4 C), temperature source Oral, resp. rate 16, height 5\' 5"  (1.651 m), weight 81.6 kg, SpO2 98%. A&O x3 Speech fluent, appropriate Strength 5/5 x4.  SILTx4.  Dressing c/d/I.   Disposition: Discharge disposition: 01-Home or Self Care       Discharge Instructions     Incentive spirometry RT   Complete by: As directed       Allergies as of 01/29/2023       Reactions   Gabapentin Other (See Comments)   Depression within 2 days   Morphine Sulfate Nausea And Vomiting        Medication List     STOP taking these medications    ibuprofen 200 MG tablet Commonly known as: ADVIL   meloxicam 7.5 MG tablet Commonly known as: MOBIC   oxyCODONE 5 MG immediate release tablet Commonly known as: Oxy IR/ROXICODONE       TAKE these medications    acetaminophen 325 MG tablet Commonly known as: TYLENOL Take 2 tablets (650 mg total) by mouth every 4 (four) hours as needed for mild pain ((score 1 to 3) or temp > 100.5).   ferrous sulfate 325 (65 FE) MG tablet Take 1 tablet (325 mg total) by mouth 2 (two) times daily with a meal.   furosemide 20 MG tablet Commonly known as: LASIX TAKE 1 TABLET BY MOUTH EVERY OTHER DAY What changed: when to take this   methocarbamol 500 MG tablet Commonly known as: ROBAXIN Take 250-500 mg by mouth See admin instructions.  Take 250 mg by mouth in the morning and afternoon and take 500 mg at bedtime   mupirocin ointment 2 % Commonly known as: BACTROBAN Place 1 Application into the nose 2 (two) times daily for 60 doses. Use as directed 2 times daily for 5 days every other week for 6 weeks.   potassium chloride 10 MEQ tablet Commonly known as: KLOR-CON Take 10 mEq by mouth daily.   pregabalin 50 MG capsule Commonly known as: LYRICA Take 1 capsule (50 mg total) by mouth 3 (three) times daily.   rOPINIRole 2 MG tablet Commonly known as: REQUIP Take 6 mg by mouth daily.   rOPINIRole 4 MG tablet Commonly known as: REQUIP TAKE 1 TABLET BY MOUTH ONCE DAILY AT BEDTIME   rosuvastatin 10 MG tablet Commonly known as: CRESTOR Take 10 mg by mouth daily.   Vitamin D 50 MCG (2000 UT) Caps Take 2,000 Units by mouth daily.         Signed: Clovis Riley 01/29/2023, 9:26 AM

## 2023-01-29 NOTE — Evaluation (Signed)
Occupational Therapy Evaluation Patient Details Name: Jade Price MRN: [id number] DOB: June 22, 1950 Today's Date: 01/29/2023   History of Present Illness Pt is a 73 y/o F s/p ACDF C3-4 on 1/21. PMH includes anxiety, back pain, depression, GERD, osteopenia, scoliosis, lumbar fusion   Clinical Impression   Pt reports ind at baseline with ADLs and uses 3WW for mobility. Pt has caregiver and family assist at home. Pt currently needing up to mod A for ADLs ( incr assist for LB ADL), CGA for bed mobility and transfers with 3WW. Pt educated on compensatory strategies for ADLs/mobility, collar wear, and cervical precautions, pt and family verbalized understanding and handout provided. Pt presenting with impairments listed below, will follow acutely. Anticipate no OT follow up needs at d/c.        If plan is discharge home, recommend the following: A little help with walking and/or transfers;A lot of help with bathing/dressing/bathroom;Assistance with cooking/housework;Assist for transportation;Help with stairs or ramp for entrance    Functional Status Assessment  Patient has had a recent decline in their functional status and demonstrates the ability to make significant improvements in function in a reasonable and predictable amount of time.  Equipment Recommendations  None recommended by OT (pt has all needed DME)    Recommendations for Other Services       Precautions / Restrictions Precautions Precautions: Cervical;Fall Precaution Booklet Issued: Yes (comment) Precaution Comments: educated on cervical prec Required Braces or Orthoses: Cervical Brace Cervical Brace: Hard collar;At all times Restrictions Weight Bearing Restrictions Per Provider Order: No      Mobility Bed Mobility Overal bed mobility: Needs Assistance Bed Mobility: Sidelying to Sit, Rolling Rolling: Contact guard assist Sidelying to sit: Contact guard assist       General bed mobility comments: log roll technique     Transfers Overall transfer level: Needs assistance Equipment used:  (1BJ) Transfers: Sit to/from Stand Sit to Stand: Contact guard assist                  Balance Overall balance assessment: Needs assistance Sitting-balance support: Feet supported Sitting balance-Leahy Scale: Good     Standing balance support: During functional activity, Reliant on assistive device for balance Standing balance-Leahy Scale: Fair                             ADL either performed or assessed with clinical judgement   ADL Overall ADL's : Needs assistance/impaired Eating/Feeding: Set up   Grooming: Set up   Upper Body Bathing: Minimal assistance   Lower Body Bathing: Moderate assistance   Upper Body Dressing : Minimal assistance   Lower Body Dressing: Moderate assistance   Toilet Transfer: Contact guard assist Toilet Transfer Details (indicate cue type and reason): with 3ww Toileting- Clothing Manipulation and Hygiene: Contact guard assist       Functional mobility during ADLs: Contact guard assist       Vision   Vision Assessment?: No apparent visual deficits     Perception Perception: Not tested       Praxis Praxis: Not tested       Pertinent Vitals/Pain Pain Assessment Pain Assessment: No/denies pain     Extremity/Trunk Assessment Upper Extremity Assessment Upper Extremity Assessment: Generalized weakness   Lower Extremity Assessment Lower Extremity Assessment: Generalized weakness   Cervical / Trunk Assessment Cervical / Trunk Assessment: Neck Surgery   Communication Communication Communication: No apparent difficulties   Cognition Arousal: Alert Behavior During Therapy: [facility]  for tasks assessed/performed Overall Cognitive Status: Within Functional Limits for tasks assessed                                 General Comments: at times needing cues for reinforcement of cervical precautions     General Comments  VSS, daughter in  law present    Exercises     Shoulder Instructions      Home Living Family/patient expects to be discharged to:: Private residence Living Arrangements: Children Available Help at Discharge: Personal care attendant;Available 24 hours/day;Family (caregiver 20 hours/week) Type of Home: House Home Access: Stairs to enter [employer] of Steps: 3   Home Layout: Multi-level;Able to live on main level with bedroom/bathroom     Bathroom Shower/Tub: Producer, television/film/video: Standard     Home Equipment: Shower seat;BSC/3in1;Wheelchair - manual ([id number])          Prior Functioning/Environment Prior Level of Function : Needs assist             Mobility Comments: 3WW at all times ADLs Comments: reports ind        OT Problem List: Decreased strength;Decreased range of motion;Decreased activity tolerance;Impaired balance (sitting and/or standing);Decreased knowledge of precautions      OT Treatment/Interventions: Self-care/ADL training;Therapeutic exercise;Energy conservation;DME and/or AE instruction;Therapeutic activities;Patient/family education;Balance training    OT Goals(Current goals can be found in the care plan section) Acute Rehab OT Goals Patient Stated Goal: none stated OT Goal Formulation: With patient Time For Goal Achievement: 02/12/23 Potential to Achieve Goals: Good  OT Frequency: Min 1X/week    Co-evaluation              AM-PAC OT "6 Clicks" Daily Activity     Outcome Measure Help from another person eating meals?: None Help from another person taking care of personal grooming?: A Little Help from another person toileting, which includes using toliet, bedpan, or urinal?: A Little Help from another person bathing (including washing, rinsing, drying)?: A Lot Help from another person to put on and taking off regular upper body clothing?: A Little Help from another person to put on and taking off regular lower body clothing?: A Lot 6  Click Score: 17   End of Session Equipment Utilized During Treatment: Gait belt;Cervical collar Nurse Communication: Mobility status  Activity Tolerance: Patient tolerated treatment well Patient left: in bed;with bed alarm set;with family/visitor present  OT Visit Diagnosis: Unsteadiness on feet (R26.81);Other abnormalities of gait and mobility (R26.89);Muscle weakness (generalized) (M62.81)                Time: 1610-9604 OT Time Calculation (min): 33 min Charges:  OT General Charges $OT Visit: 1 Visit OT Evaluation $OT Eval Low Complexity: 1 Low OT Treatments $Self Care/Home Management : 8-22 mins  Carver Fila, OTD, OTR/L [employer] Preferred Acute Rehab [phone]   Carver Fila Koonce 01/29/2023, 9:17 AM

## 2023-01-29 NOTE — Plan of Care (Signed)

## 2023-01-29 NOTE — Discharge Instructions (Addendum)
  Wound Care Keep incision covered and dry until post op day 3. You may remove the Honeycomb dressing on post op day 3. Do not put any creams, lotions, or ointments on incision. You are fine to shower. Let water run over incision and pat dry.  Activity Walk each and every day, increasing distance each day. No lifting greater than 8 lbs.  Avoid excessive neck motion. No driving, you can ride as a passenger  Diet Resume your normal diet.   Return to Work Will be discussed at your follow up appointment.  Call Your Doctor If Any of These Occur Redness, drainage, or swelling at the wound.  Temperature greater than 101 degrees. Severe pain not relieved by pain medication. Incision starts to come apart.  Follow Up Appt Call [phone] if you have one or any problem.

## 2023-01-29 NOTE — Progress Notes (Signed)
Patient alert and oriented, mae's well, voiding adequate amount of urine, swallowing without difficulty, no c/o pain at time of discharge. Patient discharged home with family. Script and discharged instructions given to patient. Patient and family stated understanding of instructions given. Patient has an appointment with Dr. Dawley in 2 weeks 

## 2023-01-30 ENCOUNTER — Encounter (HOSPITAL_COMMUNITY): Payer: Self-pay | Admitting: Neurological Surgery

## 2023-01-30 MED FILL — Thrombin For Soln 5000 Unit: CUTANEOUS | Qty: 2 | Status: AC
